# Patient Record
Sex: Male | Born: 1950
Health system: Southern US, Community
[De-identification: ages and names within clinical notes are randomized; demographics above are authoritative.]

## PROBLEM LIST (undated history)

## (undated) DIAGNOSIS — F32A Depression, unspecified: Secondary | ICD-10-CM

## (undated) DIAGNOSIS — I10 Essential (primary) hypertension: Secondary | ICD-10-CM

## (undated) DIAGNOSIS — R002 Palpitations: Secondary | ICD-10-CM

## (undated) DIAGNOSIS — E039 Hypothyroidism, unspecified: Secondary | ICD-10-CM

## (undated) DIAGNOSIS — E669 Obesity, unspecified: Secondary | ICD-10-CM

## (undated) DIAGNOSIS — I471 Supraventricular tachycardia, unspecified: Secondary | ICD-10-CM

## (undated) DIAGNOSIS — I4891 Unspecified atrial fibrillation: Secondary | ICD-10-CM

## (undated) DIAGNOSIS — F329 Major depressive disorder, single episode, unspecified: Secondary | ICD-10-CM

## (undated) DIAGNOSIS — F419 Anxiety disorder, unspecified: Secondary | ICD-10-CM

## (undated) HISTORY — PX: CHOLECYSTECTOMY: SHX55

## (undated) HISTORY — DX: Depression, unspecified: F32.A

## (undated) HISTORY — DX: Unspecified atrial fibrillation: I48.91

## (undated) HISTORY — DX: Obesity, unspecified: E66.9

## (undated) HISTORY — DX: Palpitations: R00.2

## (undated) HISTORY — DX: Supraventricular tachycardia: I47.1

## (undated) HISTORY — DX: Hypothyroidism, unspecified: E03.9

## (undated) HISTORY — PX: TONSILLECTOMY: SUR1361

## (undated) HISTORY — DX: Supraventricular tachycardia, unspecified: I47.10

## (undated) HISTORY — DX: Anxiety disorder, unspecified: F41.9

## (undated) HISTORY — DX: Major depressive disorder, single episode, unspecified: F32.9

---

## 2007-09-05 HISTORY — PX: CARDIOVASCULAR STRESS TEST: SHX262

## 2007-09-11 ENCOUNTER — Ambulatory Visit: Payer: Self-pay | Admitting: Cardiovascular Disease

## 2007-12-27 ENCOUNTER — Encounter: Payer: Self-pay | Admitting: Cardiovascular Disease

## 2008-01-16 ENCOUNTER — Encounter: Payer: Self-pay | Admitting: Cardiovascular Disease

## 2008-12-14 ENCOUNTER — Encounter: Payer: Self-pay | Admitting: Cardiovascular Disease

## 2009-03-12 DIAGNOSIS — F341 Dysthymic disorder: Secondary | ICD-10-CM

## 2009-03-12 DIAGNOSIS — I4891 Unspecified atrial fibrillation: Secondary | ICD-10-CM

## 2009-03-12 DIAGNOSIS — R002 Palpitations: Secondary | ICD-10-CM | POA: Insufficient documentation

## 2009-03-12 DIAGNOSIS — E039 Hypothyroidism, unspecified: Secondary | ICD-10-CM | POA: Insufficient documentation

## 2009-03-15 ENCOUNTER — Ambulatory Visit: Payer: Self-pay | Admitting: Cardiovascular Disease

## 2009-03-15 DIAGNOSIS — I498 Other specified cardiac arrhythmias: Secondary | ICD-10-CM

## 2009-03-24 ENCOUNTER — Ambulatory Visit: Payer: Self-pay | Admitting: Internal Medicine

## 2009-03-24 DIAGNOSIS — E669 Obesity, unspecified: Secondary | ICD-10-CM

## 2010-03-01 NOTE — Assessment & Plan Note (Signed)
Summary: eval for svt   Primary Provider:  Dr Sherryll Burger   History of Present Illness: Justin [name] is referred today by Dr. Excell Seltzer for evaluation of SVT.  The patient is a pleasant morbidly obese man with a h/o palpitations for several yrs.  These episodes start and stop suddenly and last only a minute or two at a time.  He does experience some dizziness and lightheadedness but denies anginal symptoms.  They are associated with anxiety.   Current Medications (verified): 1)  Avodart 0.5 Mg Caps (Dutasteride) .Marland Kitchen.. 1 By Mouth As Needed 2)  Levoxyl 150 Mcg Tabs (Levothyroxine Sodium) .Marland Kitchen.. 1 By Mouth Daily 3)  Paxil 40 Mg Tabs (Paroxetine Hcl) .... Take 1 Tablet By Mouth Once A Day 4)  Xanax 1 Mg Tabs (Alprazolam) .... Take 1 Tablet By Mouth Three Times A Day 5)  Aspirin 81 Mg Tbec (Aspirin) .... Take One Tablet By Mouth Daily 6)  Metoprolol Succinate 50 Mg Xr24h-Tab (Metoprolol Succinate) .... Take One Tablet By Mouth Daily 7)  Krill Oil 1000 Mg Caps (Krill Oil) .Marland Kitchen.. 1 By Mouth Daily 8)  Fish Oil   Oil (Fish Oil) .Marland Kitchen.. 1 By Mouth Daily  Allergies (verified): 1)  ! Haldol  Past History:  Past Medical History: Last updated: 03/15/2009 Current Problems:  PALPITATIONS (ICD-785.1) DEPRESSION/ANXIETY (ICD-300.4) HYPOTHYROIDISM (ICD-244.9)  Past Surgical History: Last updated: 03/12/2009 Cholecystectomy.  Review of Systems       All systems reviewed and negative except as noted in the HPI.  Vital Signs:  Patient profile:   60 year old male Height:      70 inches Weight:      298 pounds BMI:     42.91 Pulse rate:   65 / minute Resp:     16 per minute BP sitting:   108 / 73  (right arm)  Vitals Entered By: Marrion Coy, CNA (March 24, 2009 2:29 PM)  Physical Exam  General:  Pt is alert and oriented, obese male, in no acute distress. HEENT: normal Neck: normal carotid upstrokes without bruits, JVP normal Lungs: CTA. No wheezes, rales, or rhonchi. CV: RRR without murmur or  gallop Abd: soft, NT, positive BS, no bruit, no organomegaly Ext: no clubbing, cyanosis, or edema. peripheral pulses 2+ and equal Skin: warm and dry without rash    EKG  Procedure date:  03/15/2009  Findings:      Normal sinus rhythm with rate of: 64.   Impression & Recommendations:  Problem # 1:  SUPRAVENTRICULAR TACHYCARDIA (ICD-427.89) We only have Non-sustained episodes on cardiac monitoring and the rates are slow.  I am uncertain of the mechanism.  His symptoms are self limited and not particularly bothersome at this point.  I would recommend a period of watchful waiting rather than proceeding with ablation.  If his symptoms worsen then ablation would be warranted. His updated medication list for this problem includes:    Aspirin 81 Mg Tbec (Aspirin) .Marland Kitchen... Take one tablet by mouth daily    Metoprolol Succinate 50 Mg Xr24h-tab (Metoprolol succinate) .Marland Kitchen... Take one tablet by mouth daily  Problem # 2:  OBESITY, UNSPECIFIED (ICD-278.00) No obvious evidence of hypoventilation.  A low fat diet and weight loss are recommended.  Patient Instructions: 1)  Your physician recommends that you schedule a follow-up appointment in: as needed

## 2010-03-01 NOTE — Assessment & Plan Note (Signed)
Summary: f2y   Visit Type:  2 years follow up Primary Provider:  Dr Sherryll Burger  CC:  atrial fibrillation- Sob.  History of Present Illness: 60 year-old Justin [name] presents for further evaluation of palpitations.  He was evaluated in 2009 for the same problem, underwent event recording, and was diagnosed with pSVT ?AVNRT. He was started on Toprol XL, improved initially, but now has worsening palps again. He has chronic DOE, denies chest pain, edema, or other problems. Denies lightheadedness or syncope.  Current Medications (verified): 1)  Avodart 0.5 Mg Caps (Dutasteride) .... Take 1 Capsule By Mouth Once A Day 2)  Synthroid 125 Mcg Tabs (Levothyroxine Sodium) .... Take 1 Tablet By Mouth Once A Day 3)  Paxil 40 Mg Tabs (Paroxetine Hcl) .... Take 1 Tablet By Mouth Once A Day 4)  Xanax 1 Mg Tabs (Alprazolam) .... Take 1 Tablet By Mouth Three Times A Day 5)  Aspirin 81 Mg Tbec (Aspirin) .... Take One Tablet By Mouth Daily 6)  Toprol Xl 25 Mg Xr24h-Tab (Metoprolol Succinate) .... Take 1 Tablet By Mouth Once A Day  Allergies: 1)  ! Haldol  Past History:  Past medical history reviewed for relevance to current acute and chronic problems.  Past Medical History: Current Problems:  PALPITATIONS (ICD-785.1) DEPRESSION/ANXIETY (ICD-300.4) HYPOTHYROIDISM (ICD-244.9)  Review of Systems       Negative except as per HPI   Vital Signs:  Patient profile:   60 year old Justin [name] Height:      70 inches Weight:      298.25 pounds BMI:     42.95 Pulse rate:   62 / minute Pulse rhythm:   regular Resp:     20 per minute BP sitting:   100 / 68  (left arm) Cuff size:   large  Vitals Entered By: Vikki Ports (March 15, 2009 3:34 PM)  Physical Exam  General:  Pt is alert and oriented, obese Justin [name], in no acute distress. HEENT: normal Neck: normal carotid upstrokes without bruits, JVP normal Lungs: CTA CV: RRR without murmur or gallop Abd: soft, NT, positive BS, no bruit, no organomegaly Ext: no  clubbing, cyanosis, or edema. peripheral pulses 2+ and equal Skin: warm and dry without rash    EKG  Procedure date:  03/15/2009  Findings:      NSR, within normal limits, HR 62 bpm  Impression & Recommendations:  Problem # 1:  SUPRAVENTRICULAR TACHYCARDIA (ICD-427.89) Increase Toprol XL to 50 mg daily. Refer to EP for further eval. Prior event recording in chart for review.  We discussed possibility of catheter ablation versus ongoing medical therapy and he would like to pursue discussion with EP.  Past eval has included echo with normal LV function and stress Myoview with no ischemia.  His updated medication list for this problem includes:    Aspirin 81 Mg Tbec (Aspirin) .Marland Kitchen... Take one tablet by mouth daily    Metoprolol Succinate 50 Mg Xr24h-tab (Metoprolol succinate) .Marland Kitchen... Take one tablet by mouth daily  Orders: EKG w/ Interpretation (93000) EP Referral (Cardiology EP Ref )  Patient Instructions: 1)  Your physician has recommended you make the following change in your medication: INCREASE Metoprolol Succinate (Toprol XL)  to 50mg  once a day 2)  You have been referred to an Electrophysiologist for SVT.  Prescriptions: METOPROLOL SUCCINATE 50 MG XR24H-TAB (METOPROLOL SUCCINATE) Take one tablet by mouth daily  #30 x 6   Entered by:   Julieta Gutting, RN, BSN   Authorized by:   Vale Haven  Excell Seltzer, MD   Signed by:   Julieta Gutting, RN, BSN on 03/15/2009   Method used:   Electronically to        [facility] [address] (retail)       [address]       [place], [place]  [address]       Ph: [phone] or [phone]       Fax: [phone]   RxID:   [id number]

## 2010-03-01 NOTE — Consult Note (Signed)
Summary: [facility]  [facility]   Imported By: Marylou Mccoy 04/15/2009 14:23:49  _____________________________________________________________________  External Attachment:    Type:   Image     Comment:   External Document

## 2010-06-08 ENCOUNTER — Other Ambulatory Visit: Payer: Self-pay | Admitting: *Deleted

## 2010-06-08 ENCOUNTER — Other Ambulatory Visit: Payer: Self-pay | Admitting: Cardiovascular Disease

## 2010-06-08 MED ORDER — METOPROLOL SUCCINATE ER 50 MG PO TB24: 50.0000 mg | ORAL_TABLET | Freq: Every day | ORAL | Status: DC

## 2010-06-14 NOTE — Letter (Signed)
September 11, 2007    Justin Peri, MD  [address]  [place], [place] [address]   RE:  Justin, [name]  MRN:  [id number]  /  DOB:  06-23-1950   Dear Dr. Sherryll [name],   It was my pleasure to see Justin [name] for evaluation of palpitations  on September 11, 2007 as an outpatient.  As you know, he is a very nice 60-  year-old gentleman who reports heart palpitations over several months.  They occur approximately 3 times daily and have no associated symptoms.  They come on unpredictably and occur, both at rest and at times with  activity.  He describes a uncomfortable feeling in his chest.  He  denies associated dyspnea, lightheadedness, or syncope.  The symptoms  generally last for a number of seconds.  The longest duration has been a  few minutes.  The symptoms have not been aborted with Valsalva or  coughing maneuvers, although, but these have not been tried in the past.   Justin [name] has undergone extensive evaluation under your direction.  He  has had a 2-D echo, which showed normal LV systolic function with mild  concentric left ventricular hypertrophy and no significant valve  abnormalities.  LVEF was estimated at 60%.  He also underwent a exercise  Myoview scan, where he was able to exercise for 5 minutes according to  the Bruce protocol.  He achieved 90% of his maximum predicted heart rate  and had no significant arrhythmias or chest pain.  He had a normal blood  pressure response and normal perfusion imaging.  Thyroid studies have  been normal.  Event recording was performed and demonstrated sinus  rhythm throughout with the exception of a 26-beat run of tachycardia.   PAST MEDICAL HISTORY:  1. Hypothyroidism.  2. Depression.  3. Cholecystectomy.   SOCIAL HISTORY:  The patient is married.  He has 1 child and lives in  [place].  He is a former smoker, but quit 13 years ago.  He does not drink  alcohol or use illicit drugs.  He drinks a very small amount of  caffeine.  He has not  participated in any regular exercise.  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   FAMILY HISTORY:  Parents both died of heart disease in their 75s.   MEDICATIONS:  1. Avodart 0.5 mg daily.  2. Synthroid 125 mcg daily.  3. Paxil 60 mg daily.  4. Xanax 1 mg 3 times daily.  5. Aspirin 81 mg daily.  6. He has been given a prescription for Toprol-XL 25 mg, but has not      started this yet.   ALLERGIES:  ACEON.   REVIEW OF SYSTEMS:  Pertinent positives included rare headaches and  dizziness.  Reflux symptoms were noted as were anxiety and depression.  All other systems were reviewed and negative except as described above.   PHYSICAL EXAMINATION:  GENERAL:  The patient is alert and oriented.  He  is in no acute distress.  He is a very pleasant obese white male.  VITAL SIGNS:  Weight is 287 pounds, respiratory rate 12, heart rate 60,  and blood pressure 109/72.  HEENT:  Normal.  NECK:  Normal carotid upstrokes without bruits.  JVP normal.  No  thyromegaly or thyroid nodules.  LUNGS:  Clear to auscultation bilaterally.  HEART:  The apex is not palpable.  Hear has a regular rate and rhythm.  There are no murmurs or gallops.  There  is no right ventricular heave or  lift.  ABDOMEN:  Soft, obese, and nontender.  Positive bowel sounds.  No  obvious organomegaly.  BACK:  No CVA tenderness.  EXTREMITIES:  No clubbing, cyanosis, or edema.  Peripheral pulses are 2+  and equal throughout.  SKIN:  Warm and dry without rash.  LYMPHATICS:  No adenopathy.  NEUROLOGIC:  Cranial nerves II-XII are intact.  Strength is 5/5 and  equal in the arms and legs.   EKG, shows sinus rhythm and is within normal limits.  The heart rate is  72 beats per minute.   Other studies reviewed as detailed in the HPI.   ASSESSMENT:  This is a 60 year old gentleman with palpitations.  He has  no evidence of structural or ischemic heart disease based on extensive  evaluation as outlined.  The patient's  event recording shows a regular  narrow complex tachycardia.  There is an axis shift and some aberrancy  compared to his sinus rhythm.  There is a suggestion of retrograde P-  waves, which raise the possibility of AV nodal reentrant tachycardia.  I  discussed the possible treatment options, which include medical therapy  with a beta-blocker or calcium blocker as first line.  I think a trial  of Toprol-XL was appropriate.  The patient will go ahead and start this  medication that you have prescribed.  He really prefers not to be on  long-term medication if possible.  So, I think an evaluation by one of  our EP Colleagues for ablation is warranted.  This will be arranged and  we will see if he may have an ablatable rhythm, which the patient  strongly desires.   Dr. Sherryll [name], thanks again for allowing me to participate in the care of  this very nice gentleman.  Please feel free to call at any time with  questions.    Sincerely,      Justin Fells. Excell Seltzer, MD  Electronically Signed    MDC/MedQ  DD: 09/11/2007  DT: 09/12/2007  Job #: [id number]

## 2011-01-21 ENCOUNTER — Other Ambulatory Visit: Payer: Self-pay | Admitting: Cardiovascular Disease

## 2011-01-23 ENCOUNTER — Other Ambulatory Visit: Payer: Self-pay

## 2011-01-23 MED ORDER — METOPROLOL SUCCINATE ER 50 MG PO TB24: 50.0000 mg | ORAL_TABLET | Freq: Every day | ORAL | Status: DC

## 2011-01-23 NOTE — Telephone Encounter (Signed)
.   Requested Prescriptions   Signed Prescriptions Disp Refills  . metoprolol (TOPROL XL) 50 MG 24 hr tablet 30 tablet 6    Sig: Take 1 tablet (50 mg total) by mouth daily.    Authorizing Provider: Tonny Bollman D    Ordering User: Lacie Scotts   E-scribe to [facility].

## 2011-01-25 ENCOUNTER — Telehealth: Payer: Self-pay | Admitting: Internal Medicine

## 2011-01-25 NOTE — Telephone Encounter (Signed)
New PRoblem:    PAtient called in wanting to have his metoprolol (TOPROL XL) 50 MG 24 hr tablet refilled. Also would like an appointment to see Dr. Ladona Ridgel as soon as possible because he says that his Tachycardia is getting worse, he is short winded and his legs are hurting.  Please feel free to leave a message on his answering machiene.

## 2011-01-25 NOTE — Telephone Encounter (Signed)
lmom for patient that his medication was called into [facility] and Glynda Jaeger would call him tomorrow with an appointment  To follow up with Dr Ladona Ridgel to discuss an ablation

## 2011-02-28 ENCOUNTER — Encounter: Payer: Self-pay | Admitting: Internal Medicine

## 2011-03-06 ENCOUNTER — Encounter: Payer: Self-pay | Admitting: Internal Medicine

## 2011-03-06 ENCOUNTER — Ambulatory Visit (INDEPENDENT_AMBULATORY_CARE_PROVIDER_SITE_OTHER): Payer: BC Managed Care – PPO | Admitting: Internal Medicine

## 2011-03-06 VITALS — BP 112/62 | HR 53 | Ht 70.0 in | Wt 298.0 lb

## 2011-03-06 DIAGNOSIS — I498 Other specified cardiac arrhythmias: Secondary | ICD-10-CM

## 2011-03-06 DIAGNOSIS — R002 Palpitations: Secondary | ICD-10-CM

## 2011-03-06 DIAGNOSIS — I4891 Unspecified atrial fibrillation: Secondary | ICD-10-CM

## 2011-03-06 NOTE — Patient Instructions (Signed)
Your physician has requested that you have an exercise tolerance test. For further information please visit [email or web address]. Please also follow instruction sheet, as given.   

## 2011-03-06 NOTE — Assessment & Plan Note (Signed)
His symptoms are currently not well controlled. Will undergo exercise treadmill testing as he notes that his palpitations have been associated with exertion.

## 2011-03-06 NOTE — Assessment & Plan Note (Signed)
The etiology of his symptoms is unclear. What is clear is that they are reproducible with exertion. The spells start and stop suddenly. They related to exertion but also occur in the middle of night while he is sleeping. I've recommended that he undergo exercise treadmill testing as exertion seems to be fairly reproducible at breathing on his spells.

## 2011-03-06 NOTE — Assessment & Plan Note (Signed)
The palpitations could be related to atrial fibrillation. We have not documented this however. He will undergo a period of watchful waiting.

## 2011-03-06 NOTE — Progress Notes (Signed)
HPI Mr. Wenke returns today for followup of palpitations and SVT. The patient is a 61 year old man with a long history of tachy- palpitations. I saw him approximately 2 years ago and placed him on beta blockers. He did well until approximately 6 months ago when he began to experience recurrent tachypalpitations. The patient notes that with any exertion, he is heart will race for up to an hour at a time. At other times particularly when he is sleeping he will feel like it is racing. The episodes start and stop suddenly. He has had no syncope but gets lightheaded. With his episodes of heart racing, he experiences dyspnea. Allergies  Allergen Reactions  . Haloperidol Lactate      Current Outpatient Prescriptions  Medication Sig Dispense Refill  . ALPRAZolam (XANAX) 1 MG tablet Take 1 mg by mouth 4 (four) times daily.      . fish oil-omega-3 fatty acids 1000 MG capsule Take 2 g by mouth daily.      Marland Kitchen levothyroxine (SYNTHROID, LEVOTHROID) 150 MCG tablet Take 150 mcg by mouth daily.      . metoprolol (TOPROL XL) 50 MG 24 hr tablet Take 1 tablet (50 mg total) by mouth daily.  30 tablet  6  . PARoxetine (PAXIL) 20 MG tablet Take 20 mg by mouth every morning.         Past Medical History  Diagnosis Date  . AF (atrial fibrillation)   . Palpitations   . Anxiety and depression   . Hypothyroidism     ROS:   All systems reviewed and negative except as noted in the HPI.   Past Surgical History  Procedure Date  . Cholecystectomy   . Cardiovascular stress test 09/05/2007     Family History  Problem Relation Age of Onset  . Heart disease Mother   . Heart disease Father      History   Social History  . Marital Status: Married    Spouse Name: N/A    Number of Children: N/A  . Years of Education: N/A   Occupational History  . Not on file.   Social History Main Topics  . Smoking status: Former Games developer  . Smokeless tobacco: Not on file  . Alcohol Use: No  . Drug Use: No  .  Sexually Active: Not on file   Other Topics Concern  . Not on file   Social History Narrative  . No narrative on file     BP 112/62  Pulse 53  Ht 5\' 10"  (1.778 m)  Wt 135.172 kg (298 lb)  BMI 42.76 kg/m2  Physical Exam:  Well appearing middle-aged man, NAD HEENT: Unremarkable Neck:  No JVD, no thyromegally Lungs:  Clear with no wheezes, rales, or rhonchi. HEART:  Regular rate rhythm, no murmurs, no rubs, no clicks Abd:  soft, positive bowel sounds, no organomegally, no rebound, no guarding Ext:  2 plus pulses, no edema, no cyanosis, no clubbing Skin:  No rashes no nodules Neuro:  CN II through XII intact, motor grossly intact  EKG Normal sinus rhythm.  Assess/Plan:

## 2011-03-22 ENCOUNTER — Ambulatory Visit (INDEPENDENT_AMBULATORY_CARE_PROVIDER_SITE_OTHER): Payer: BC Managed Care – PPO | Admitting: Physician Assistant

## 2011-03-22 ENCOUNTER — Encounter: Payer: BC Managed Care – PPO | Admitting: Physician Assistant

## 2011-03-22 DIAGNOSIS — R002 Palpitations: Secondary | ICD-10-CM

## 2011-03-22 NOTE — Progress Notes (Signed)
Exercise Treadmill Test  Pre-Exercise Testing Evaluation Rhythm: normal sinus  Rate: 71   PR:  20 QRS:  .08  QT:  42 QTc: 45     Test  Exercise Tolerance Test Ordering MD: Lewayne Bunting, MD  Interpreting MD:  Tereso Newcomer PA-C  Unique Test No: 1  Treadmill:  1  Indication for ETT: Palps  Contraindication to ETT: No   Stress Modality: exercise - treadmill  Cardiac Imaging Performed: non   Protocol: standard Bruce - maximal  Max BP:  160/87  Max MPHR (bpm):  159 85% MPR (bpm):  135  MPHR obtained (bpm):  126 % MPHR obtained:  79%  Reached 85% MPHR (min:sec):  n/a Total Exercise Time (min-sec):  4:25  Workload in METS:  5.8 Borg Scale: 17  Reason ETT Terminated:  patient's desire to stop    ST Segment Analysis At Rest: normal ST segments - no evidence of significant ST depression With Exercise: non-specific ST changes  Other Information Arrhythmia:  PACs and PVCs in recovery Angina during ETT:  absent (0) Quality of ETT:  non-diagnostic  ETT Interpretation:  normal - no evidence of ischemia by ST analysis at submaximal exercise  Comments: Poor exercise tolerance. Exercise limited by back and knee pain. No chest pain. Normal BP response to exercise. No ST-T changes to suggest ischemia at submaximal exercise.  Frequent PACs and PVCs (unifocal) in recovery (especially early in recovery).  Patient was mildly symptomatic with this.  Recommendations: Follow up with Dr. Lewayne Bunting as directed. Tereso Newcomer, PA-C  10:29 AM 03/22/2011

## 2011-12-06 ENCOUNTER — Other Ambulatory Visit: Payer: Self-pay | Admitting: Cardiovascular Disease

## 2012-05-10 ENCOUNTER — Other Ambulatory Visit: Payer: Self-pay | Admitting: Cardiovascular Disease

## 2013-02-05 ENCOUNTER — Telehealth: Payer: Self-pay | Admitting: Internal Medicine

## 2013-02-05 NOTE — Telephone Encounter (Signed)
Melissa called patient and offered an appointment for tomorrow.  Left message for patient to call back

## 2013-02-05 NOTE — Telephone Encounter (Signed)
New message  Pt called states that his metoporlol is not working. He is also requesting an exercise tolerance test// No orders// Please assist.

## 2013-02-07 ENCOUNTER — Encounter: Payer: Self-pay | Admitting: Internal Medicine

## 2013-02-07 ENCOUNTER — Ambulatory Visit (INDEPENDENT_AMBULATORY_CARE_PROVIDER_SITE_OTHER): Payer: BC Managed Care – PPO | Admitting: Internal Medicine

## 2013-02-07 ENCOUNTER — Encounter: Payer: Self-pay | Admitting: *Deleted

## 2013-02-07 VITALS — BP 116/80 | HR 58 | Ht 70.0 in | Wt 277.8 lb

## 2013-02-07 DIAGNOSIS — R0789 Other chest pain: Secondary | ICD-10-CM

## 2013-02-07 DIAGNOSIS — I4891 Unspecified atrial fibrillation: Secondary | ICD-10-CM

## 2013-02-07 DIAGNOSIS — R079 Chest pain, unspecified: Secondary | ICD-10-CM

## 2013-02-07 MED ORDER — NITROGLYCERIN 0.4 MG SL SUBL: 0.4000 mg | SUBLINGUAL_TABLET | SUBLINGUAL | Status: DC | PRN

## 2013-02-07 NOTE — Patient Instructions (Signed)
Your physician recommends that you schedule a follow-up appointment in: ONE MONTH WITH DR Ladona RidgelAYLOR  Your physician has requested that you have a dobutamine echocardiogram. For further information please visit [email or web address]. Please follow instruction sheet as given.

## 2013-02-07 NOTE — Assessment & Plan Note (Signed)
He has had no recurrent documented atrial arrhythmias. We'll continue his current medications.

## 2013-02-07 NOTE — Assessment & Plan Note (Signed)
The etiology of his symptoms is unclear. He has multiple cardiac risk factors. He will undergo dobutamine echocardiography. Based on the results, we will consider additional testing as indicated.

## 2013-02-07 NOTE — Progress Notes (Signed)
HPI [name] returns today for followup of palpitations and SVT. The patient is a [age] year old man with a long history of tachy- palpitations. I saw him approximately 3 years ago and placed him on beta blockers. He still has occasional palpitations.unfortunately, the patient also has had substernal chest discomfort, associated with shortness of breath and radiation into the neck. At times these episodes are associated with palpitations, but at other times not. He has not taken any nitroglycerin. Allergies  Allergen Reactions  . Haloperidol Lactate      Current Outpatient Prescriptions  Medication Sig Dispense Refill  . ALPRAZolam (XANAX) 1 MG tablet Take 1 mg by mouth 3 (three) times daily.       Marland Kitchen. aspirin EC 81 MG tablet Take 81 mg by mouth daily.      Marland Kitchen. levothyroxine (SYNTHROID, LEVOTHROID) 150 MCG tablet Take 150 mcg by mouth daily.      . metoprolol succinate (TOPROL-XL) 50 MG 24 hr tablet TAKE ONE TABLET BY MOUTH ONCE DAILY.  30 tablet  0  . Omega-3 Krill Oil 1000 MG CAPS Take 1,000 mg by mouth daily.      Marland Kitchen. PARoxetine (PAXIL) 20 MG tablet Take 20 mg by mouth every morning.      . tamsulosin (FLOMAX) 0.4 MG CAPS capsule Take 1 capsule by mouth as needed.        No current facility-administered medications for this visit.     Past Medical History  Diagnosis Date  . AF (atrial fibrillation)   . Palpitations   . Anxiety and depression   . Hypothyroidism   . SVT (supraventricular tachycardia)   . Obesity     ROS:   All systems reviewed and negative except as noted in the HPI.   Past Surgical History  Procedure Laterality Date  . Cholecystectomy    . Cardiovascular stress test  09/05/2007     Family History  Problem Relation Age of Onset  . Heart disease Mother   . Heart disease Father      History   Social History  . Marital Status: Married    Spouse Name: N/A    Number of Children: N/A  . Years of Education: N/A   Occupational History  . Not on file.    Social History Main Topics  . Smoking status: Former Games developermoker  . Smokeless tobacco: Not on file  . Alcohol Use: No  . Drug Use: No  . Sexual Activity: Not on file   Other Topics Concern  . Not on file   Social History Narrative  . No narrative on file     BP 116/80  Pulse 58  Ht 5\' 10"  (1.778 m)  Wt 277 lb 12.8 oz (126.009 kg)  BMI 39.86 kg/m2  Physical Exam:  Well appearing obese, middle-aged man, NAD HEENT: Unremarkable Neck:  No JVD, no thyromegally Lungs:  Clear with no wheezes, rales, or rhonchi. HEART:  Regular rate rhythm, no murmurs, no rubs, no clicks Abd:  Soft, obese, positive bowel sounds, no organomegally, no rebound, no guarding Ext:  2 plus pulses, no edema, no cyanosis, no clubbing Skin:  No rashes no nodules Neuro:  CN II through XII intact, motor grossly intact  EKG Normal sinus rhythm.  Assess/Plan:

## 2013-02-11 NOTE — Telephone Encounter (Signed)
Patient was seen 02/07/13

## 2013-02-28 ENCOUNTER — Ambulatory Visit (HOSPITAL_COMMUNITY): Payer: BC Managed Care – PPO | Attending: Internal Medicine | Admitting: Radiology

## 2013-02-28 ENCOUNTER — Encounter: Payer: Self-pay | Admitting: Internal Medicine

## 2013-02-28 ENCOUNTER — Ambulatory Visit (HOSPITAL_BASED_OUTPATIENT_CLINIC_OR_DEPARTMENT_OTHER): Payer: BC Managed Care – PPO

## 2013-02-28 DIAGNOSIS — R42 Dizziness and giddiness: Secondary | ICD-10-CM | POA: Insufficient documentation

## 2013-02-28 DIAGNOSIS — R072 Precordial pain: Secondary | ICD-10-CM

## 2013-02-28 DIAGNOSIS — Z87891 Personal history of nicotine dependence: Secondary | ICD-10-CM | POA: Insufficient documentation

## 2013-02-28 DIAGNOSIS — R0609 Other forms of dyspnea: Secondary | ICD-10-CM | POA: Insufficient documentation

## 2013-02-28 DIAGNOSIS — Z8249 Family history of ischemic heart disease and other diseases of the circulatory system: Secondary | ICD-10-CM | POA: Insufficient documentation

## 2013-02-28 DIAGNOSIS — R0989 Other specified symptoms and signs involving the circulatory and respiratory systems: Secondary | ICD-10-CM

## 2013-02-28 DIAGNOSIS — E669 Obesity, unspecified: Secondary | ICD-10-CM | POA: Insufficient documentation

## 2013-02-28 DIAGNOSIS — I498 Other specified cardiac arrhythmias: Secondary | ICD-10-CM | POA: Insufficient documentation

## 2013-02-28 DIAGNOSIS — R079 Chest pain, unspecified: Secondary | ICD-10-CM | POA: Insufficient documentation

## 2013-02-28 MED ORDER — PERFLUTREN PROTEIN A MICROSPH IV SUSP
3.0000 mL | Freq: Once | INTRAVENOUS | Status: AC
  Administered 2013-02-28: 3 mL via INTRAVENOUS

## 2013-02-28 NOTE — Progress Notes (Signed)
Dobutamine stress Echocardiogram performed with optison

## 2013-03-21 ENCOUNTER — Encounter: Payer: Self-pay | Admitting: Internal Medicine

## 2013-03-21 ENCOUNTER — Ambulatory Visit (INDEPENDENT_AMBULATORY_CARE_PROVIDER_SITE_OTHER): Payer: BC Managed Care – PPO | Admitting: Internal Medicine

## 2013-03-21 VITALS — BP 117/73 | HR 63 | Ht 70.0 in | Wt 275.0 lb

## 2013-03-21 DIAGNOSIS — R0789 Other chest pain: Secondary | ICD-10-CM

## 2013-03-21 DIAGNOSIS — R002 Palpitations: Secondary | ICD-10-CM

## 2013-03-21 MED ORDER — METOPROLOL SUCCINATE ER 50 MG PO TB24: ORAL_TABLET | ORAL | Status: DC

## 2013-03-21 NOTE — Patient Instructions (Signed)
Your physician recommends that you schedule a follow-up appointment in: 1 year with Dr Taylor You will receive a reminder letter two months in advance reminding you to call and schedule your appointment. If you don't receive this letter, please contact our office.  Your physician recommends that you continue on your current medications as directed. Please refer to the Current Medication list given to you today.   

## 2013-03-21 NOTE — Assessment & Plan Note (Signed)
His dobutamine stress test was negative for ischemia. Will follow.

## 2013-03-21 NOTE — Progress Notes (Signed)
      HPI Mr. Annice NeedyGauldin returns today for followup. He has a h/o c/p and palpitations and I saw him approximately a month ago and he has undergone dobutamine echo which demonstrated no ischemia. He also has a h/o palpitations of unclear etiology. His blood pressure has been controlled. No syncope.  Allergies  Allergen Reactions  . Haloperidol Lactate      Current Outpatient Prescriptions  Medication Sig Dispense Refill  . ALPRAZolam (XANAX) 1 MG tablet Take 1 mg by mouth 3 (three) times daily.       Marland Kitchen. aspirin EC 81 MG tablet Take 81 mg by mouth daily.      Marland Kitchen. levothyroxine (SYNTHROID, LEVOTHROID) 150 MCG tablet Take 150 mcg by mouth daily.      . metoprolol succinate (TOPROL-XL) 50 MG 24 hr tablet TAKE ONE TABLET BY MOUTH ONCE DAILY.  30 tablet  0  . nitroGLYCERIN (NITROSTAT) 0.4 MG SL tablet Place 1 tablet (0.4 mg total) under the tongue every 5 (five) minutes as needed for chest pain.  25 tablet  12  . Omega-3 Krill Oil 1000 MG CAPS Take 1,000 mg by mouth daily.      Marland Kitchen. PARoxetine (PAXIL) 20 MG tablet Take 20 mg by mouth every morning.      . tamsulosin (FLOMAX) 0.4 MG CAPS capsule Take 1 capsule by mouth as needed.        No current facility-administered medications for this visit.     Past Medical History  Diagnosis Date  . AF (atrial fibrillation)   . Palpitations   . Anxiety and depression   . Hypothyroidism   . SVT (supraventricular tachycardia)   . Obesity     ROS:   All systems reviewed and negative except as noted in the HPI.   Past Surgical History  Procedure Laterality Date  . Cholecystectomy    . Cardiovascular stress test  09/05/2007     Family History  Problem Relation Age of Onset  . Heart disease Mother   . Heart disease Father      History   Social History  . Marital Status: Married    Spouse Name: N/A    Number of Children: N/A  . Years of Education: N/A   Occupational History  . Not on file.   Social History Main Topics  . Smoking  status: Former Games developermoker  . Smokeless tobacco: Not on file  . Alcohol Use: No  . Drug Use: No  . Sexual Activity: Not on file   Other Topics Concern  . Not on file   Social History Narrative  . No narrative on file     BP 117/73  Pulse 63  Ht 5\' 10"  (1.778 m)  Wt 275 lb (124.739 kg)  BMI 39.46 kg/m2  Physical Exam:  Well appearing middle aged man, NAD HEENT: Unremarkable Neck:  No JVD, no thyromegally Back:  No CVA tenderness Lungs:  Clear with no wheezes HEART:  Regular rate rhythm, no murmurs, no rubs, no clicks Abd:  soft, positive bowel sounds, no organomegally, no rebound, no guarding Ext:  2 plus pulses, no edema, no cyanosis, no clubbing Skin:  No rashes no nodules Neuro:  CN II through XII intact, motor grossly intact   Dobutamin echo - reviewed. No ischemia. Normal LV function  Assess/Plan:

## 2013-03-21 NOTE — Assessment & Plan Note (Signed)
The etiology is unclear. His symptoms are fairly well controlled. I have asked the patient to take an additional dose of metoprolol, particularly if bothered by palpitations in the evening. If this still does not control his symptoms, then he is instructed to call our office and we will prescribe flecainide and early followup.

## 2015-08-19 ENCOUNTER — Ambulatory Visit (INDEPENDENT_AMBULATORY_CARE_PROVIDER_SITE_OTHER): Payer: Medicare Other | Admitting: Internal Medicine

## 2015-08-19 ENCOUNTER — Encounter: Payer: Self-pay | Admitting: Internal Medicine

## 2015-08-19 VITALS — BP 106/74 | HR 56 | Ht 70.0 in | Wt 266.0 lb

## 2015-08-19 DIAGNOSIS — R002 Palpitations: Secondary | ICD-10-CM

## 2015-08-19 DIAGNOSIS — R072 Precordial pain: Secondary | ICD-10-CM | POA: Diagnosis not present

## 2015-08-19 MED ORDER — NITROGLYCERIN 0.4 MG SL SUBL: 0.4000 mg | SUBLINGUAL_TABLET | SUBLINGUAL | Status: AC | PRN

## 2015-08-19 NOTE — Patient Instructions (Addendum)
Your physician recommends that you schedule a follow-up appointment in:  Based on test results     Your physician has recommended that you wear a holter monitor. Holter monitors are medical devices that record the heart's electrical activity. Doctors most often use these monitors to diagnose arrhythmias. Arrhythmias are problems with the speed or rhythm of the heartbeat. The monitor is a small, portable device. You can wear one while you do your normal daily activities. This is usually used to diagnose what is causing palpitations/syncope (passing out).  .Your physician has requested that you have a lexiscan myoview. For further information please visit [email or web address]. Please follow instruction sheet, as given.     I refilled your NTG

## 2015-08-19 NOTE — Progress Notes (Signed)
HPI [name] returns today for followup. He is a 65 yo man with PAF, and SVT, and HTN. I have not seen him in over 2 years. His blood pressure has been controlled. No syncope. He c/o worsening palpitations which occur daily. He also c/o chest pressure which is different than his heart burn. Finally, his wife who sleeps in a differnet room in the house notes that he snores so loud the roof shakes! No edema. Allergies  Allergen Reactions  . Haloperidol Lactate      Current Outpatient Prescriptions  Medication Sig Dispense Refill  . ALPRAZolam (XANAX) 1 MG tablet Take 1 mg by mouth 3 (three) times daily.     Marland Kitchen. aspirin EC 81 MG tablet Take 81 mg by mouth daily.    . finasteride (PROSCAR) 5 MG tablet Take 5 mg by mouth daily.    Marland Kitchen. levothyroxine (SYNTHROID, LEVOTHROID) 150 MCG tablet Take 150 mcg by mouth daily.    . metoprolol succinate (TOPROL-XL) 50 MG 24 hr tablet Please take as directed 60 tablet 6  . nitroGLYCERIN (NITROSTAT) 0.4 MG SL tablet Place 1 tablet (0.4 mg total) under the tongue every 5 (five) minutes as needed for chest pain. 25 tablet 3  . Omega-3 Krill Oil 1000 MG CAPS Take 1,000 mg by mouth daily.    Marland Kitchen. PARoxetine (PAXIL) 20 MG tablet Take 20 mg by mouth every morning.    . tamsulosin (FLOMAX) 0.4 MG CAPS capsule Take 1 capsule by mouth as needed.     . furosemide (LASIX) 20 MG tablet Take 20 mg by mouth daily.     Marland Kitchen. lisinopril (PRINIVIL,ZESTRIL) 2.5 MG tablet Take 2.5 mg by mouth daily.      No current facility-administered medications for this visit.     Past Medical History  Diagnosis Date  . AF (atrial fibrillation) (HCC)   . Palpitations   . Anxiety and depression   . Hypothyroidism   . SVT (supraventricular tachycardia) (HCC)   . Obesity     ROS:   All systems reviewed and negative except as noted in the HPI.   Past Surgical History  Procedure Laterality Date  . Cholecystectomy    . Cardiovascular stress test  09/05/2007     Family  History  Problem Relation Age of Onset  . Heart disease Mother   . Heart disease Father      Social History   Social History  . Marital Status: Married    Spouse Name: N/A  . Number of Children: N/A  . Years of Education: N/A   Occupational History  . Not on file.   Social History Main Topics  . Smoking status: Former Games developermoker  . Smokeless tobacco: Not on file  . Alcohol Use: No  . Drug Use: No  . Sexual Activity: Not on file   Other Topics Concern  . Not on file   Social History Narrative     BP 106/74 mmHg  Pulse 56  Ht 5\' 10"  (1.778 m)  Wt 266 lb (120.657 kg)  BMI 38.17 kg/m2  SpO2 98%  Physical Exam:  Well appearing obese, middle aged man, NAD HEENT: Unremarkable Neck:  6 cm JVD, no thyromegally Back:  No CVA tenderness Lungs:  Clear with no wheezes HEART:  Regular rate rhythm, no murmurs, no rubs, no clicks Abd:  soft, positive bowel sounds, no organomegally, no rebound, no guarding Ext:  2 plus pulses, no edema, no cyanosis, no clubbing Skin:  No  rashes no nodules Neuro:  CN II through XII intact, motor grossly intact   Assess/Plan: 1. Chest pain - unclear if this is angina. He has multiple cardiac risks. I will have him undergo a lexiscan myoview as he previously tried a regular stress test and could not walk long enough to get his HR up before his back "gave out". If Lexiscan is abnormal, low threshold to do a heart cath. 2. Palpitations - his arrhythmias are not well characterized. He states that he has daily symptoms. He will undergo a 48 hour holter monitor and additional rec's will be made based on the result of his monitor. 3. Snoring - sounds like he has sleep apnea as he also notes daytime somnolence. Will plan a sleep study after problems #1 and 2 have been sorted out. 4. HTN - his blood pressure is controlled. 5. Obesity - he is nearly a hundred pounds over weight. We will work on this in the future.  Leonia Reeves.D.

## 2015-08-25 ENCOUNTER — Inpatient Hospital Stay (HOSPITAL_COMMUNITY): Admission: RE | Admit: 2015-08-25 | Payer: Medicare Other | Source: Ambulatory Visit

## 2015-08-25 ENCOUNTER — Ambulatory Visit (HOSPITAL_COMMUNITY)
Admission: RE | Admit: 2015-08-25 | Discharge: 2015-08-25 | Disposition: A | Discharge status: Home / Self Care | Payer: Medicare Other | Source: Ambulatory Visit | Attending: Internal Medicine | Admitting: Internal Medicine

## 2015-08-25 ENCOUNTER — Encounter (HOSPITAL_COMMUNITY)
Admission: RE | Admit: 2015-08-25 | Discharge: 2015-08-25 | Disposition: A | Discharge status: Home / Self Care | Payer: Medicare Other | Source: Ambulatory Visit | Attending: Internal Medicine | Admitting: Internal Medicine

## 2015-08-25 ENCOUNTER — Encounter (HOSPITAL_COMMUNITY): Payer: Self-pay

## 2015-08-25 DIAGNOSIS — R931 Abnormal findings on diagnostic imaging of heart and coronary circulation: Secondary | ICD-10-CM | POA: Insufficient documentation

## 2015-08-25 DIAGNOSIS — R002 Palpitations: Secondary | ICD-10-CM | POA: Insufficient documentation

## 2015-08-25 DIAGNOSIS — R072 Precordial pain: Secondary | ICD-10-CM | POA: Insufficient documentation

## 2015-08-25 HISTORY — DX: Essential (primary) hypertension: I10

## 2015-08-25 LAB — NM MYOCAR MULTI W/SPECT W/WALL MOTION / EF
CHL CUP NUCLEAR SSS: 4
LV dias vol: 79 mL (ref 62–150)
LV sys vol: 27 mL
NUC STRESS TID: 1.03
Peak HR: 90 {beats}/min
RATE: 0.33
Rest HR: 53 {beats}/min
SDS: 4
SRS: 0

## 2015-08-25 MED ORDER — REGADENOSON 0.4 MG/5ML IV SOLN
INTRAVENOUS | Status: AC
  Administered 2015-08-25: 0.4 mg via INTRAVENOUS
  Filled 2015-08-25: qty 5

## 2015-08-25 MED ORDER — TECHNETIUM TC 99M TETROFOSMIN IV KIT
30.0000 | PACK | Freq: Once | INTRAVENOUS | Status: AC | PRN
  Administered 2015-08-25: 29.3 via INTRAVENOUS

## 2015-08-25 MED ORDER — SODIUM CHLORIDE 0.9% FLUSH
INTRAVENOUS | Status: AC
  Administered 2015-08-25: 10 mL via INTRAVENOUS
  Filled 2015-08-25: qty 10

## 2015-08-25 MED ORDER — TECHNETIUM TC 99M TETROFOSMIN IV KIT
10.0000 | PACK | Freq: Once | INTRAVENOUS | Status: AC | PRN
  Administered 2015-08-25: 9.7 via INTRAVENOUS

## 2015-09-03 ENCOUNTER — Telehealth: Payer: Self-pay

## 2015-09-03 NOTE — Telephone Encounter (Signed)
PT was referred by Dr. Sherryll Burger for a screening colonoscopy and heme positive stool.  Called and [facility] for a return call from pt to schedule OV first.

## 2015-09-06 ENCOUNTER — Telehealth: Payer: Self-pay | Admitting: *Deleted

## 2015-09-06 NOTE — Telephone Encounter (Signed)
Antoine PocheJonathan F Branch, MD  Nori Riisatherine A Dywane Peruski, RN        Please copy into his chart. Heart monitor looks good, just some occasional extra heart beats but no abnormal rhythms. Can he clarify how he is taking his Toprol XL, we can consider increasing the dose a little bit to see if it helps with symptoms   Dominga FerryJ Branch MD   Previous Messages    ----- Message -----  From: Nori Riisatherine A Shaneya Taketa, RN  Sent: 09/06/2015  3:34 PM  To: Antoine PocheJonathan F Branch, MD  Subject: holter results                  Elenor Quinonesaylor pt,[place] away,will u result holter results please      Pt states he feels his "irregular beat" about once a day,not fast,just irregular,lasts less than an hr,pt watches caffeine,no alcholol

## 2015-09-15 ENCOUNTER — Ambulatory Visit: Payer: Medicare Other | Admitting: Urology

## 2015-09-24 NOTE — Telephone Encounter (Signed)
Letter mailed to pt to call.  

## 2015-10-08 NOTE — Telephone Encounter (Signed)
LMOM to call and schedule OV appt. Sending a letter to Dr. Sherryll BurgerShah that pt has not responded to previous call or letter.

## 2016-07-06 NOTE — Progress Notes (Signed)
Cardiology Office Note   Date:  07/07/2016   ID:  Justin [name], DOB 12-22-1950, MRN [id number]  PCP:  Kirstie PeriShah, Ashish, MD  Cardiologist:  Ladona Ridgelaylor Chief Complaint  Patient presents with  . Hypertension  . Dizziness      History of Present Illness: Justin [name] is a 66 y.o. male who presents for ongoing assessment and management of PAF, SVT, and hypertension. He has not been seen in the office since 07/2015. He complained of chest pain at that time and was scheduled for a NM stress test.Due to frequent palpitations, he was scheduled for a 48 hour Holter monitor.   Stress Test 08/25/2015  There was no ST segment deviation noted during stress.  Findings consistent with mild inferoapcal ischemia, cannot rule out slight differences in apical thinning as cause of defect. Either finding is low risk.  The left ventricular ejection fraction is hyperdynamic (>65%).  This is a low risk study.  Holter Monitor 08/26/2015 48 hour Holter monitor reviewed. Sinus rhythm and sinus tachycardia noted. Rare PACs with occasional fusion beats as well. Heart rate ranged from 42 bpm up to 120 bpm with average heart rate 65 bpm. There were no sustained arrhythmias or pauses.  He was [facility] [place] for complaints of neck pain and dizziness. He was diagnosed with musculoskeletal pain. He followed up with Dr. Sherryll BurgerShah, PCP with ongoing symptoms of dizziness. Metoprolol was decreased to 25 mg daily from 50 mg daily. He was continued on lasix and lisinopril. He has not taken the lisinopril as this causes more dizziness. He is otherwise compliant. He denies chest pain or heart racing on lower dose of metoprolol.    Past Medical History:  Diagnosis Date  . AF (atrial fibrillation) (HCC)   . Anxiety and depression   . Hypertension   . Hypothyroidism   . Obesity   . Palpitations   . SVT (supraventricular tachycardia) (HCC)     Past Surgical History:  Procedure Laterality Date  .  CARDIOVASCULAR STRESS TEST  09/05/2007  . CHOLECYSTECTOMY       Current Outpatient Prescriptions  Medication Sig Dispense Refill  . ALPRAZolam (XANAX) 1 MG tablet Take 1 mg by mouth 3 (three) times daily.     Marland Kitchen. aspirin EC 81 MG tablet Take 81 mg by mouth daily.    . finasteride (PROSCAR) 5 MG tablet Take 5 mg by mouth daily.    . furosemide (LASIX) 20 MG tablet Take 20 mg by mouth daily.     Marland Kitchen. levothyroxine (SYNTHROID, LEVOTHROID) 150 MCG tablet Take 150 mcg by mouth daily.    Marland Kitchen. lisinopril (PRINIVIL,ZESTRIL) 2.5 MG tablet Take 2.5 mg by mouth daily.     . nitroGLYCERIN (NITROSTAT) 0.4 MG SL tablet Place 1 tablet (0.4 mg total) under the tongue every 5 (five) minutes as needed for chest pain. 25 tablet 3  . Omega-3 Krill Oil 1000 MG CAPS Take 1,000 mg by mouth daily.    Marland Kitchen. PARoxetine (PAXIL) 20 MG tablet Take 20 mg by mouth every morning.    . tamsulosin (FLOMAX) 0.4 MG CAPS capsule Take 1 capsule by mouth as needed.     . furosemide (LASIX) 20 MG tablet Take 1 tablet (20 mg total) by mouth daily as needed. 90 tablet 3  . metoprolol succinate (TOPROL XL) 25 MG 24 hr tablet Take 0.5 tablets (12.5 mg total) by mouth daily. 45 tablet 3   No current facility-administered medications for this visit.  Allergies:   Haloperidol lactate    Social History:  The patient  reports that he has quit smoking. His smokeless tobacco use includes Snuff. He reports that he does not drink alcohol or use drugs.   Family History:  The patient's family history includes Heart disease in his father and mother.    ROS: All other systems are reviewed and negative. Unless otherwise mentioned in H&P    PHYSICAL EXAM: VS:  BP 108/70   Pulse 61   Ht 5\' 10"  (1.778 m)   Wt 269 lb (122 kg)   SpO2 96%   BMI 38.60 kg/m  , BMI Body mass index is 38.6 kg/m. GEN: Well nourished, well developed, in no acute distress Morbidly obese.  HEENT: normal  Neck: no JVD, carotid bruits, or masses Cardiac: RRR; no  murmurs, rubs, or gallops,no edema  Respiratory:  clear to auscultation bilaterally, normal work of breathing GI: soft, nontender, nondistended, + BS MS: no deformity or atrophy  Skin: warm and dry, no rash Neuro:  Strength and sensation are intact Psych: euthymic mood, full affect   EKG:  The ekg reviewed from recent hospitalization reveals sinus bradycardia with 1st degree AV block. HR 51 bpm.    Recent Labs: No results found for requested labs within last 8760 hours.    Lipid Panel No results found for: CHOL, TRIG, HDL, CHOLHDL, VLDL, LDLCALC, LDLDIRECT    Wt Readings from Last 3 Encounters:  07/07/16 269 lb (122 kg)  08/19/15 266 lb (120.7 kg)  03/21/13 275 lb (124.7 kg)      Other studies Reviewed: Echocardiogram/Stress Echo 1\30/2015 The estimated LV ejection fraction was 60%. - Normal wall motion; no LV regional wall motion abnormalities. Low dose:  - The estimated LV ejection fraction was 70%. - Normal wall motion; no LV regional wall motion abnormalities. Peak stress:  - The estimated LV ejection fraction was 75%. - Normal wall motion; no LV regional wall motion abnormalities. Recovery:  - The estimated LV ejection fraction was 70%. - Normal wall motion; no LV regional wall motion abnormalities.  ASSESSMENT AND PLAN:  1. Dizziness: Multifactorial. On several medications that can cause this. Orthostatic BP was completed in the clinic today.He was not found to be orthostatic, but remained hypotensive. Will make the following changes:   1. Discontinue daily lasix, and use prn. 2. Decrease metoprolol to 12.5 mg daily, and may take additional 12.5 if he feels racing HR or palpitations.  3. Continue lisinopril for cardiorenal protection in the setting of diabetes.   Consider decreasing Flomax to 0.2 mg from 0.4 mg as this can also cause positional dizziness. Will defer to PCP on this.   2. Hypertension: BP is low today. Reviewed latest echo. He has  normal LV function. NO evidence of diastolic dysfunction. Removing lasix may be more helpful for hypotension for now. He will come back in a couple of weeks for BP check on medication changes. See him again in one month for follow up.   3. Hx of Atrial fib; Recent Holter monitor did not reveal atrial fib. He is not on anticoagulation at this time.    Current medicines are reviewed at length with the patient today.    Labs/ tests ordered today include: BMET in one month  Bettey Mare. Liborio Nixon, ANP, AACC   07/07/2016 3:20 PM    [facility] [address] [place], [place] [address] Phone: [phone]; Fax: [phone]

## 2016-07-07 ENCOUNTER — Encounter: Payer: Self-pay | Admitting: Adult Health

## 2016-07-07 ENCOUNTER — Ambulatory Visit (INDEPENDENT_AMBULATORY_CARE_PROVIDER_SITE_OTHER): Payer: Medicare HMO | Admitting: Adult Health

## 2016-07-07 VITALS — BP 108/70 | HR 61 | Ht 70.0 in | Wt 269.0 lb

## 2016-07-07 DIAGNOSIS — Z79899 Other long term (current) drug therapy: Secondary | ICD-10-CM

## 2016-07-07 DIAGNOSIS — R42 Dizziness and giddiness: Secondary | ICD-10-CM

## 2016-07-07 DIAGNOSIS — I10 Essential (primary) hypertension: Secondary | ICD-10-CM | POA: Diagnosis not present

## 2016-07-07 DIAGNOSIS — I4891 Unspecified atrial fibrillation: Secondary | ICD-10-CM

## 2016-07-07 MED ORDER — FUROSEMIDE 20 MG PO TABS
20.0000 mg | ORAL_TABLET | Freq: Every day | ORAL | 3 refills | Status: DC | PRN
Start: 2016-07-07 — End: 2016-08-07

## 2016-07-07 MED ORDER — METOPROLOL SUCCINATE ER 25 MG PO TB24: 12.5000 mg | ORAL_TABLET | Freq: Every day | ORAL | 3 refills | Status: DC

## 2016-07-07 NOTE — Patient Instructions (Addendum)
Your physician recommends that you schedule a follow-up appointment in: 1 Month   Your physician has recommended you make the following change in your medication:   Change Lasix to only as needed for increased fluid   Decrease Metoprolol to 12.5 mg at Bedtime   Take Lisinopril 2.5 mg Daily in the Morning   Your physician recommends that you return for lab work in: 1 Month   If you need a refill on your cardiac medications before your next appointment, please call your pharmacy.   Thank you for choosing [facility]!

## 2016-08-03 ENCOUNTER — Other Ambulatory Visit (HOSPITAL_COMMUNITY)
Admission: RE | Admit: 2016-08-03 | Discharge: 2016-08-03 | Disposition: A | Discharge status: Home / Self Care | Payer: Medicare HMO | Source: Ambulatory Visit | Attending: Adult Health | Admitting: Adult Health

## 2016-08-03 DIAGNOSIS — I4891 Unspecified atrial fibrillation: Secondary | ICD-10-CM | POA: Diagnosis present

## 2016-08-03 DIAGNOSIS — Z79899 Other long term (current) drug therapy: Secondary | ICD-10-CM | POA: Diagnosis present

## 2016-08-03 LAB — BASIC METABOLIC PANEL
Anion gap: 10 (ref 5–15)
BUN: 19 mg/dL (ref 6–20)
CALCIUM: 8.9 mg/dL (ref 8.9–10.3)
CO2: 29 mmol/L (ref 22–32)
CREATININE: 1.15 mg/dL (ref 0.61–1.24)
Chloride: 99 mmol/L — ABNORMAL LOW (ref 101–111)
GFR calc non Af Amer: >60 mL/min (ref >60)
GLUCOSE: 112 mg/dL — AB (ref 65–99)
Potassium: 4.1 mmol/L (ref 3.5–5.1)
Sodium: 138 mmol/L (ref 135–145)

## 2016-08-07 ENCOUNTER — Ambulatory Visit (INDEPENDENT_AMBULATORY_CARE_PROVIDER_SITE_OTHER): Payer: Medicare HMO | Admitting: Adult Health

## 2016-08-07 ENCOUNTER — Encounter: Payer: Self-pay | Admitting: Adult Health

## 2016-08-07 VITALS — BP 116/72 | HR 69 | Ht 71.0 in | Wt 265.0 lb

## 2016-08-07 DIAGNOSIS — I10 Essential (primary) hypertension: Secondary | ICD-10-CM | POA: Diagnosis not present

## 2016-08-07 DIAGNOSIS — I480 Paroxysmal atrial fibrillation: Secondary | ICD-10-CM | POA: Diagnosis not present

## 2016-08-07 MED ORDER — METOPROLOL SUCCINATE ER 25 MG PO TB24: 12.5000 mg | ORAL_TABLET | Freq: Two times a day (BID) | ORAL | 3 refills | Status: DC

## 2016-08-07 MED ORDER — POTASSIUM CHLORIDE ER 10 MEQ PO TBCR: 10.0000 meq | EXTENDED_RELEASE_TABLET | Freq: Every day | ORAL | 3 refills | Status: DC

## 2016-08-07 NOTE — Progress Notes (Signed)
. Cardiology Office Note   Date:  08/07/2016   ID:  Justin [name], DOB 1950/08/29, MRN [id number]  PCP:  Justin [name], Ashish, MD  Cardiologist: Justin [name]  Chief Complaint  Patient presents with  . Atrial Fibrillation  . Hypertension      History of Present Illness: Justin [name] is a 66 y.o. male who presents for ongoing assessment and management of paroxysmal atrial fibrillation, history of SVT, and hypertension. He was last seen in the office on 07/07/2016 to discuss 40 hour Holter monitor and stress test. These tests were completed due to symptoms of dizziness.  On that office visit Lasix was discontinued and to be used when necessary, metoprolol was decreased to 12.5 mg daily, but could take an additional 12.5 mg if he felt his heart racing or having increasing palpitation. There was consideration to decrease Flomax 0.2 mg and 0.4 mg as that could also cause some mild positional dizziness. He was to talk with his PCP about this. He was found to be mildly hypotensive.  The patient on his own, has increased his metoprolol to 12.5 mg twice a day. He states he feels his blood pressure quite and he has a headache at night. He states that now that he has started taking it twice a day he feels better. He denies dizziness or rapid heart rhythm. He is also questioning whether had not his Justin [name] is healthy enough to have sexual activity.  Past Medical History:  Diagnosis Date  . AF (atrial fibrillation) (HCC)   . Anxiety and depression   . Hypertension   . Hypothyroidism   . Obesity   . Palpitations   . SVT (supraventricular tachycardia) (HCC)     Past Surgical History:  Procedure Laterality Date  . CARDIOVASCULAR STRESS TEST  09/05/2007  . CHOLECYSTECTOMY       Current Outpatient Prescriptions  Medication Sig Dispense Refill  . ALPRAZolam (XANAX) 1 MG tablet Take 1 mg by mouth 3 (three) times daily.     Marland Kitchen. aspirin EC 81 MG tablet Take 81 mg by mouth daily.    . finasteride  (PROSCAR) 5 MG tablet Take 5 mg by mouth daily.    . furosemide (LASIX) 20 MG tablet Take 20 mg by mouth daily.     Marland Kitchen. levothyroxine (SYNTHROID, LEVOTHROID) 150 MCG tablet Take 150 mcg by mouth daily.    Marland Kitchen. lisinopril (PRINIVIL,ZESTRIL) 2.5 MG tablet Take 2.5 mg by mouth daily.     . metoprolol succinate (TOPROL XL) 25 MG 24 hr tablet Take 0.5 tablets (12.5 mg total) by mouth 2 (two) times daily. 90 tablet 3  . nitroGLYCERIN (NITROSTAT) 0.4 MG SL tablet Place 1 tablet (0.4 mg total) under the tongue every 5 (five) minutes as needed for chest pain. 25 tablet 3  . Omega-3 Krill Oil 1000 MG CAPS Take 1,000 mg by mouth daily.    Marland Kitchen. PARoxetine (PAXIL) 20 MG tablet Take 20 mg by mouth every morning.    . tamsulosin (FLOMAX) 0.4 MG CAPS capsule Take 1 capsule by mouth as needed.     . potassium chloride (K-DUR) 10 MEQ tablet Take 1 tablet (10 mEq total) by mouth daily. 90 tablet 3   No current facility-administered medications for this visit.     Allergies:   Haloperidol lactate    Social History:  The patient  reports that he has quit smoking. His smokeless tobacco use includes Snuff. He reports that he does not drink alcohol or use drugs.   Family  History:  The patient's family history includes Heart disease in his father and mother.    ROS: All other systems are reviewed and negative. Unless otherwise mentioned in H&P    PHYSICAL EXAM: VS:  BP 116/72   Pulse 69   Ht 5\' 11"  (1.803 m)   Wt 265 lb (120.2 kg)   SpO2 97%   BMI 36.96 kg/m  , BMI Body mass index is 36.96 kg/m. GEN: Well nourished, well developed, in no acute distress Obese. HEENT: normal  Neck: no JVD, carotid bruits, or masses Cardiac: RRR; no murmurs, rubs, or gallops,no edema  Respiratory:  clear to auscultation bilaterally, normal work of breathing GI: soft, nontender, nondistended, + BS MS: no deformity or atrophy  Skin: warm and dry, no rash Neuro:  Strength and sensation are intact Psych: euthymic mood, full  affect   Recent Labs: 08/03/2016: BUN 19; Creatinine, Ser 1.15; Potassium 4.1; Sodium 138    Lipid Panel No results found for: CHOL, TRIG, HDL, CHOLHDL, VLDL, LDLCALC, LDLDIRECT    Wt Readings from Last 3 Encounters:  08/07/16 265 lb (120.2 kg)  07/07/16 269 lb (122 kg)  08/19/15 266 lb (120.7 kg)      Other studies Reviewed: NM Stress Test Study Result    There was no ST segment deviation noted during stress.  Findings consistent with mild inferoapcal ischemia, cannot rule out slight differences in apical thinning as cause of defect. Either finding is low risk.  The left ventricular ejection fraction is hyperdynamic (>65%).  This is a low risk study.    ASSESSMENT AND PLAN:  1. Hypertension: Blood pressure is currently controlled. He states he feels his blood pressure go up at night along with having a headache. He has been increasing his metoprolol succinate 12.5 mg 2 twice a day from daily. He states this makes him feel better in the evening. We'll continue this as he is having symptoms in the evening and they subside with taking metoprolol. He denies any dizziness or fatigue associated with this. He is also taking Lasix daily although we have asked him to take it as needed. He states that he does not like to have any fluid retention. I'm going to add 10 mEq of potassium to his medication regimen to avoid muscle aches and pains and fatigue. He states he does have the son occasionally but he will eat bananas.  It appears that he prefers to titrate his medication regimen pending on how he feels. We'll follow closely his report any problems associated with medications or symptoms.  I told him from a cardiac standpoint he can purchase patient's sexual activity. If he begins to have rapid heart rhythm that is not well-controlled or significant chest pain he is to report this to [place].  2. Paroxysmal atrial fibrillation: He denies any rapid heart rhythm or palpitations. We'll continue  him on current medication regimen with the addition of the 12.5 mg in the p.m. He is not on anticoagulation therapy currently.     Current medicines are reviewed at length with the patient today.    Labs/ tests ordered today include: None. Follow-up with Dr. Ladona Ridgel at his request.  Bettey Mare. Liborio Nixon, ANP, AACC   08/07/2016 2:41 PM    [facility] [address] [place], [place] [address] Phone: [phone]; Fax: [phone]

## 2016-08-07 NOTE — Patient Instructions (Signed)
Medication Instructions:  START POTASSIUM 10 MEQ DAILY  START METOPROLOL 12.5 MG IN THE MORNING & 12.5 MG IN THE EVENING  Labwork: NONE  Testing/Procedures: NONE  Follow-Up: Your physician wants you to follow-up in: 6 MONTHS WITH DR. Ladona RidgelAYLOR. You will receive a reminder letter in the mail two months in advance. If you don't receive a letter, please call our office to schedule the follow-up appointment.   Any Other Special Instructions Will Be Listed Below (If Applicable).     If you need a refill on your cardiac medications before your next appointment, please call your pharmacy.

## 2016-11-08 ENCOUNTER — Ambulatory Visit (INDEPENDENT_AMBULATORY_CARE_PROVIDER_SITE_OTHER): Payer: Medicare HMO | Admitting: Urology

## 2016-11-08 DIAGNOSIS — N401 Enlarged prostate with lower urinary tract symptoms: Secondary | ICD-10-CM

## 2016-11-08 DIAGNOSIS — R102 Pelvic and perineal pain: Secondary | ICD-10-CM | POA: Diagnosis not present

## 2016-11-20 ENCOUNTER — Emergency Department (HOSPITAL_COMMUNITY): Payer: Medicare HMO

## 2016-11-20 ENCOUNTER — Encounter (HOSPITAL_COMMUNITY): Payer: Self-pay | Admitting: Emergency Medicine

## 2016-11-20 ENCOUNTER — Emergency Department (HOSPITAL_COMMUNITY)
Admission: EM | Admit: 2016-11-20 | Discharge: 2016-11-20 | Disposition: A | Discharge status: Home / Self Care | Payer: Medicare HMO | Attending: Emergency Medicine | Admitting: Emergency Medicine

## 2016-11-20 DIAGNOSIS — Z79899 Other long term (current) drug therapy: Secondary | ICD-10-CM | POA: Insufficient documentation

## 2016-11-20 DIAGNOSIS — R0600 Dyspnea, unspecified: Secondary | ICD-10-CM | POA: Diagnosis not present

## 2016-11-20 DIAGNOSIS — E039 Hypothyroidism, unspecified: Secondary | ICD-10-CM | POA: Diagnosis not present

## 2016-11-20 DIAGNOSIS — F17290 Nicotine dependence, other tobacco product, uncomplicated: Secondary | ICD-10-CM | POA: Diagnosis not present

## 2016-11-20 DIAGNOSIS — I10 Essential (primary) hypertension: Secondary | ICD-10-CM | POA: Diagnosis not present

## 2016-11-20 DIAGNOSIS — R0601 Orthopnea: Secondary | ICD-10-CM | POA: Diagnosis present

## 2016-11-20 LAB — BASIC METABOLIC PANEL
Anion gap: 9 (ref 5–15)
BUN: 16 mg/dL (ref 6–20)
CO2: 30 mmol/L (ref 22–32)
Calcium: 8.8 mg/dL — ABNORMAL LOW (ref 8.9–10.3)
Chloride: 101 mmol/L (ref 101–111)
Creatinine, Ser: 1.33 mg/dL — ABNORMAL HIGH (ref 0.61–1.24)
GFR calc Af Amer: >60 mL/min (ref >60)
GFR calc non Af Amer: 54 mL/min — ABNORMAL LOW (ref >60)
Glucose, Bld: 107 mg/dL — ABNORMAL HIGH (ref 65–99)
Potassium: 3.5 mmol/L (ref 3.5–5.1)
Sodium: 140 mmol/L (ref 135–145)

## 2016-11-20 LAB — URINALYSIS, ROUTINE W REFLEX MICROSCOPIC
Bilirubin Urine: NEGATIVE
Glucose, UA: NEGATIVE mg/dL
Hgb urine dipstick: NEGATIVE
Ketones, ur: NEGATIVE mg/dL
Leukocytes, UA: NEGATIVE
Nitrite: NEGATIVE
Protein, ur: NEGATIVE mg/dL
Specific Gravity, Urine: 1.015 (ref 1.005–1.030)
pH: 5 (ref 5.0–8.0)

## 2016-11-20 LAB — CBC WITH DIFFERENTIAL/PLATELET
Basophils Absolute: 0 10*3/uL (ref 0.0–0.1)
Basophils Relative: 0 %
Eosinophils Absolute: 0.5 10*3/uL (ref 0.0–0.7)
Eosinophils Relative: 7 %
HCT: 42.3 % (ref 39.0–52.0)
Hemoglobin: 14.6 g/dL (ref 13.0–17.0)
Lymphocytes Relative: 31 %
Lymphs Abs: 2.5 10*3/uL (ref 0.7–4.0)
MCH: 33.3 pg (ref 26.0–34.0)
MCHC: 34.5 g/dL (ref 30.0–36.0)
MCV: 96.6 fL (ref 78.0–100.0)
Monocytes Absolute: 0.6 10*3/uL (ref 0.1–1.0)
Monocytes Relative: 7 %
Neutro Abs: 4.3 10*3/uL (ref 1.7–7.7)
Neutrophils Relative %: 55 %
Platelets: 139 10*3/uL — ABNORMAL LOW (ref 150–400)
RBC: 4.38 MIL/uL (ref 4.22–5.81)
RDW: 12.4 % (ref 11.5–15.5)
WBC: 7.9 10*3/uL (ref 4.0–10.5)

## 2016-11-20 LAB — BRAIN NATRIURETIC PEPTIDE: B Natriuretic Peptide: 54 pg/mL (ref 0.0–100.0)

## 2016-11-20 LAB — TROPONIN I: Troponin I: <0.03 ng/mL (ref <0.03)

## 2016-11-20 MED ORDER — PANTOPRAZOLE SODIUM 20 MG PO TBEC: 20.0000 mg | DELAYED_RELEASE_TABLET | Freq: Every day | ORAL | 0 refills | Status: DC

## 2016-11-20 MED ORDER — PANTOPRAZOLE SODIUM 40 MG PO TBEC
40.0000 mg | DELAYED_RELEASE_TABLET | Freq: Once | ORAL | Status: AC
  Administered 2016-11-20: 40 mg via ORAL
  Filled 2016-11-20: qty 1

## 2016-11-20 MED ORDER — FAMOTIDINE 20 MG PO TABS
20.0000 mg | ORAL_TABLET | Freq: Once | ORAL | Status: AC
  Administered 2016-11-20: 20 mg via ORAL
  Filled 2016-11-20: qty 1

## 2016-11-20 NOTE — Discharge Instructions (Signed)
I think your symptoms may actually be from reflux (GERD). It can cause more typical "heart burn" but also potentially less typical symptoms such as cough, orthopnea (short of breath when laying on back), sensation of something in the chest, etc.   You are being started on protonix. I want you to follow-up with your PCP.

## 2016-11-20 NOTE — ED Triage Notes (Signed)
Pt c/o abd bloating, left groin pain, cough, sob, and states he is not making enough urine since Saturday. Pt states he was seen for the same are [facility] Saturday.

## 2016-11-20 NOTE — ED Notes (Signed)
Patient transported to X-ray 

## 2016-11-20 NOTE — ED Provider Notes (Signed)
[facility] Provider Note   CSN: [id number] Arrival date & time: 11/20/16  1858     History   Chief Complaint Chief Complaint  Patient presents with  . Multiple complaints    HPI Justin [name] is a 66 y.o. male.  HPI   66 year old male with cough.  Onset Saturday.  Persistent since then.  Nonproductive.  Endorses orthopnea.  Mild lower extremity swelling.  Vague sensation of needing to clear his throat but not really pain.  He [facility] ago for the same complaints.  He had extensive workup at that time.  He was advised to increase his Lasix.  He has not noticed any significant change in his symptoms despite this.  Past Medical History:  Diagnosis Date  . AF (atrial fibrillation) (HCC)   . Anxiety and depression   . Hypertension   . Hypothyroidism   . Obesity   . Palpitations   . SVT (supraventricular tachycardia) [facility](HCC)     Patient Active Problem List   Diagnosis Date Noted  . Chest pressure 02/07/2013  . OBESITY, UNSPECIFIED 03/24/2009  . SUPRAVENTRICULAR TACHYCARDIA 03/15/2009  . HYPOTHYROIDISM 03/12/2009  . DEPRESSION/ANXIETY 03/12/2009  . ATRIAL FIBRILLATION 03/12/2009  . PALPITATIONS 03/12/2009    Past Surgical History:  Procedure Laterality Date  . CARDIOVASCULAR STRESS TEST  09/05/2007  . CHOLECYSTECTOMY         Home Medications    Prior to Admission medications   Medication Sig Start Date End Date Taking? Authorizing Provider  ALPRAZolam Prudy Feeler(XANAX) 1 MG tablet Take 1 mg by mouth 3 (three) times daily.     [provider]  aspirin EC 81 MG tablet Take 81 mg by mouth daily.    [provider]  finasteride (PROSCAR) 5 MG tablet Take 5 mg by mouth daily.    [provider]  furosemide (LASIX) 20 MG tablet Take 20 mg by mouth daily.  08/03/15   [provider]  levothyroxine (SYNTHROID, LEVOTHROID) 150 MCG tablet Take 150 mcg by mouth daily.    [provider]    lisinopril (PRINIVIL,ZESTRIL) 2.5 MG tablet Take 2.5 mg by mouth daily.  08/03/15   [provider]  metoprolol succinate (TOPROL XL) 25 MG 24 hr tablet Take 0.5 tablets (12.5 mg total) by mouth 2 (two) times daily. 08/07/16   Jodelle GrossLawrence, Kathryn M, NP  nitroGLYCERIN (NITROSTAT) 0.4 MG SL tablet Place 1 tablet (0.4 mg total) under the tongue every 5 (five) minutes as needed for chest pain. 08/19/15   Marinus Mawaylor, Gregg W, MD  Omega-3 Krill Oil 1000 MG CAPS Take 1,000 mg by mouth daily.    [provider]  PARoxetine (PAXIL) 20 MG tablet Take 20 mg by mouth every morning.    [provider]  potassium chloride (K-DUR) 10 MEQ tablet Take 1 tablet (10 mEq total) by mouth daily. 08/07/16 11/05/16  Jodelle GrossLawrence, Kathryn M, NP  tamsulosin (FLOMAX) 0.4 MG CAPS capsule Take 1 capsule by mouth as needed.  02/04/13   [provider]    Family History Family History  Problem Relation Age of Onset  . Heart disease Mother   . Heart disease Father     Social History Social History  Substance Use Topics  . Smoking status: Former Games developermoker  . Smokeless tobacco: Current User    Types: Snuff  . Alcohol use No     Allergies   Haloperidol lactate   Review of Systems Review of Systems  All systems reviewed and negative, other than as noted in HPI.  Physical Exam Updated Vital Signs BP 119/71   Pulse 71   Temp 98.7 F (37.1 C)   Resp 18   Ht 5\' 10"  (1.778 m)   Wt 122.5 kg (270 lb)   SpO2 98%   BMI 38.74 kg/m   Physical Exam  Constitutional: He appears well-developed and well-nourished. No distress.  HENT:  Head: Normocephalic and atraumatic.  Eyes: Conjunctivae are normal. Right eye exhibits no discharge. Left eye exhibits no discharge.  Neck: Neck supple.  Cardiovascular: Normal rate, regular rhythm and normal heart sounds.  Exam reveals no gallop and no friction rub.   No murmur heard. Pulmonary/Chest: Effort normal and breath sounds normal. No respiratory distress.   Abdominal: Soft. He exhibits no distension. There is no tenderness.  Musculoskeletal: He exhibits edema. He exhibits no tenderness.  Minimal symmetric Le edema  Neurological: He is alert.  Skin: Skin is warm and dry.  Psychiatric: He has a normal mood and affect. His behavior is normal. Thought content normal.  Nursing note and vitals reviewed.    ED Treatments / Results  Labs (all labs ordered are listed, but only abnormal results are displayed) Labs Reviewed  CBC WITH DIFFERENTIAL/PLATELET - Abnormal; Notable for the following:       Result Value   Platelets 139 (*)    All other components within normal limits  BASIC METABOLIC PANEL - Abnormal; Notable for the following:    Glucose, Bld 107 (*)    Creatinine, Ser 1.33 (*)    Calcium 8.8 (*)    GFR calc non Af Amer 54 (*)    All other components within normal limits  BRAIN NATRIURETIC PEPTIDE  TROPONIN I  URINALYSIS, ROUTINE W REFLEX MICROSCOPIC    EKG  EKG Interpretation  Date/Time:  Monday November 20 2016 20:22:11 EDT Ventricular Rate:  61 PR Interval:    QRS Duration: 91 QT Interval:  443 QTC Calculation: 447 R Axis:   11 Text Interpretation:  Sinus rhythm Prolonged PR interval Low voltage, precordial leads Confirmed by Raeford Razor [phone]) on 11/20/2016 8:40:54 PM       Radiology Dg Chest 2 View  Result Date: 11/20/2016 CLINICAL DATA:  Dyspnea with chest pain EXAM: CHEST  2 VIEW COMPARISON:  11/18/2016 FINDINGS: No focal consolidation or effusion. Mild bronchitic changes. Stable cardiomediastinal silhouette. Atelectasis at the lingula and left base. No pneumothorax. Degenerative changes of the spine. Surgical clips in the right upper quadrant IMPRESSION: Mild bronchitic changes.  No focal consolidation or edema. Electronically Signed   By: Jasmine Pang M.D.   On: 11/20/2016 20:35    Procedures Procedures (including critical care time)  Medications Ordered in ED Medications - No data to  display   Initial Impression / Assessment and Plan / ED Course  I have reviewed the triage vital signs and the nursing notes.  Pertinent labs & imaging results that were available during my care of the patient were reviewed by me and considered in my medical decision making (see chart for details).   He was seen in the ED at [facility] and had an extensive work-up two days ago which I reviewed. CXR (clear), BNP (normal), d-dimer (normal), troponin x2, (normal), ABG (not completely normal, but overall unremarkable), CBC, CMP, influenza, strep, and UA. All testing reassuring.   He is c/o LE edema but minimal on my exam. Lungs sounds clear. o2 sats normal. The testing I did today in  the ED wasn't nearly as extensive, but again reassuring. This doesn't appear to be ACS, decompensated HF, pulmonary embolism or another emergent process.  Overall, I don't have much to objectively explain his symptoms and his exam is reassuring. I suspect his actually may be GERD. It could explain the vague sensation in his chest, non-productive cough, orthopnea, etc. When talking with him about this he said he did have "heart burn" about a week ago which is unusual for him and not something he has had in quite some time. He reports that he was previously on tagamet but not in several years. Will give a trial of protonix. Cr a little bumped from a couple days ago. Advised to go back to previous dosing and FU with PCP.   Final Clinical Impressions(s) / ED Diagnoses   Final diagnoses:  Dyspnea, unspecified type    New Prescriptions New Prescriptions   No medications on file     Raeford Razor, MD 11/22/16 0001

## 2017-07-06 ENCOUNTER — Ambulatory Visit: Payer: Medicare HMO | Admitting: Urology

## 2017-07-06 DIAGNOSIS — R102 Pelvic and perineal pain: Secondary | ICD-10-CM | POA: Diagnosis not present

## 2017-07-06 DIAGNOSIS — N401 Enlarged prostate with lower urinary tract symptoms: Secondary | ICD-10-CM

## 2017-07-09 ENCOUNTER — Other Ambulatory Visit: Payer: Self-pay | Admitting: Urology

## 2017-07-09 ENCOUNTER — Other Ambulatory Visit: Payer: Self-pay | Admitting: Adult Health

## 2017-07-18 ENCOUNTER — Emergency Department (HOSPITAL_COMMUNITY)
Admission: EM | Admit: 2017-07-18 | Discharge: 2017-07-18 | Disposition: A | Discharge status: Home / Self Care | Payer: Medicare HMO | Attending: Emergency Medicine | Admitting: Emergency Medicine

## 2017-07-18 ENCOUNTER — Other Ambulatory Visit: Payer: Self-pay

## 2017-07-18 ENCOUNTER — Telehealth: Payer: Self-pay | Admitting: Internal Medicine

## 2017-07-18 ENCOUNTER — Emergency Department (HOSPITAL_COMMUNITY): Payer: Medicare HMO

## 2017-07-18 ENCOUNTER — Encounter (HOSPITAL_COMMUNITY): Payer: Self-pay | Admitting: Emergency Medicine

## 2017-07-18 DIAGNOSIS — M542 Cervicalgia: Secondary | ICD-10-CM | POA: Insufficient documentation

## 2017-07-18 DIAGNOSIS — R072 Precordial pain: Secondary | ICD-10-CM | POA: Insufficient documentation

## 2017-07-18 DIAGNOSIS — Z79899 Other long term (current) drug therapy: Secondary | ICD-10-CM | POA: Diagnosis not present

## 2017-07-18 DIAGNOSIS — Z7982 Long term (current) use of aspirin: Secondary | ICD-10-CM | POA: Insufficient documentation

## 2017-07-18 DIAGNOSIS — E039 Hypothyroidism, unspecified: Secondary | ICD-10-CM | POA: Diagnosis not present

## 2017-07-18 DIAGNOSIS — Z87891 Personal history of nicotine dependence: Secondary | ICD-10-CM | POA: Diagnosis not present

## 2017-07-18 DIAGNOSIS — R0602 Shortness of breath: Secondary | ICD-10-CM

## 2017-07-18 DIAGNOSIS — M79601 Pain in right arm: Secondary | ICD-10-CM | POA: Diagnosis not present

## 2017-07-18 DIAGNOSIS — I10 Essential (primary) hypertension: Secondary | ICD-10-CM | POA: Insufficient documentation

## 2017-07-18 DIAGNOSIS — M79602 Pain in left arm: Secondary | ICD-10-CM | POA: Insufficient documentation

## 2017-07-18 DIAGNOSIS — R42 Dizziness and giddiness: Secondary | ICD-10-CM | POA: Insufficient documentation

## 2017-07-18 DIAGNOSIS — Z8669 Personal history of other diseases of the nervous system and sense organs: Secondary | ICD-10-CM | POA: Diagnosis not present

## 2017-07-18 DIAGNOSIS — I672 Cerebral atherosclerosis: Secondary | ICD-10-CM | POA: Diagnosis not present

## 2017-07-18 LAB — CBC WITH DIFFERENTIAL/PLATELET
BASOS PCT: 1 %
Basophils Absolute: 0 10*3/uL (ref 0.0–0.1)
Eosinophils Absolute: 0.6 10*3/uL (ref 0.0–0.7)
Eosinophils Relative: 10 %
HEMATOCRIT: 41.4 % (ref 39.0–52.0)
HEMOGLOBIN: 14 g/dL (ref 13.0–17.0)
LYMPHS PCT: 27 %
Lymphs Abs: 1.7 10*3/uL (ref 0.7–4.0)
MCH: 32.9 pg (ref 26.0–34.0)
MCHC: 33.8 g/dL (ref 30.0–36.0)
MCV: 97.4 fL (ref 78.0–100.0)
MONOS PCT: 9 %
Monocytes Absolute: 0.5 10*3/uL (ref 0.1–1.0)
NEUTROS ABS: 3.4 10*3/uL (ref 1.7–7.7)
NEUTROS PCT: 53 %
Platelets: 136 10*3/uL — ABNORMAL LOW (ref 150–400)
RBC: 4.25 MIL/uL (ref 4.22–5.81)
RDW: 12.6 % (ref 11.5–15.5)
WBC: 6.3 10*3/uL (ref 4.0–10.5)

## 2017-07-18 LAB — BASIC METABOLIC PANEL
Anion gap: 7 (ref 5–15)
BUN: 14 mg/dL (ref 6–20)
CHLORIDE: 107 mmol/L (ref 101–111)
CO2: 24 mmol/L (ref 22–32)
Calcium: 8.2 mg/dL — ABNORMAL LOW (ref 8.9–10.3)
Creatinine, Ser: 1.04 mg/dL (ref 0.61–1.24)
GFR calc non Af Amer: >60 mL/min (ref >60)
Glucose, Bld: 92 mg/dL (ref 65–99)
Potassium: 3.5 mmol/L (ref 3.5–5.1)
SODIUM: 138 mmol/L (ref 135–145)

## 2017-07-18 LAB — BRAIN NATRIURETIC PEPTIDE: B Natriuretic Peptide: 25 pg/mL (ref 0.0–100.0)

## 2017-07-18 LAB — TROPONIN I: Troponin I: <0.03 ng/mL (ref <0.03)

## 2017-07-18 LAB — D-DIMER, QUANTITATIVE (NOT AT ARMC): D DIMER QUANT: 0.54 ug{FEU}/mL — AB (ref 0.00–0.50)

## 2017-07-18 MED ORDER — IOPAMIDOL (ISOVUE-370) INJECTION 76%
75.0000 mL | Freq: Once | INTRAVENOUS | Status: AC | PRN
  Administered 2017-07-18: 75 mL via INTRAVENOUS

## 2017-07-18 NOTE — Telephone Encounter (Signed)
New message     1. Are you currently SOB (can you hear that pt is SOB on the phone)?  "some"  2. How long have you been experiencing SOB? 2 weeks  3. Are you SOB when sitting or when up moving around? Moving around  4. Are you currently experiencing any other symptoms? Dizziness, occasional chest pain.  Patient calling with concerns about arm, neck and leg pain.  Patient declined Friday appointment.

## 2017-07-18 NOTE — Telephone Encounter (Signed)
Returned call to Pt. Pt with intermittent chest pain radiating to left arm and side of his neck. Pt has accompanying shortness of breath and and unable to complete his morning walks.  This has been ongoing for 2 weeks but is not resolving. Pt has not taken any of his nitroglycerin. Pt is very concerned and not sure he can wait until Friday to be seen. Encouraged Pt to [facility]. Will continue to monitor for possible follow up with Dr. Ladona Ridgelaylor.

## 2017-07-18 NOTE — ED Provider Notes (Signed)
[facility] EMERGENCY DEPARTMENT Provider Note   CSN: [id number] Arrival date & time: 07/18/17  1150     History   Chief Complaint Chief Complaint  Patient presents with  . Shortness of Breath    HPI Justin [name] is a 67 y.o. male.  Patient with presentation of multiple complaints.  Main complaint was bilateral upper chest pain radiating to shoulders and neck with shortness of breath during exertion for about a month.  Is intermittent but is present more often than not.  Patient is also had decreased energy.  Also had some dizziness.  No substernal chest pain no numbness or weakness in the hands.  Patient followed by cardiology for history of supraventricular tachycardia and also seems like atrial fibrillation.  Patient states he is on beta-blockers.  He is followed by cardiology here.  Seen about once a year.  Last seen in the fall 2018.     Past Medical History:  Diagnosis Date  . AF (atrial fibrillation) (HCC)   . Anxiety and depression   . Hypertension   . Hypothyroidism   . Obesity   . Palpitations   . SVT (supraventricular tachycardia) [facility])     Patient Active Problem List   Diagnosis Date Noted  . Chest pressure 02/07/2013  . OBESITY, UNSPECIFIED 03/24/2009  . SUPRAVENTRICULAR TACHYCARDIA 03/15/2009  . HYPOTHYROIDISM 03/12/2009  . DEPRESSION/ANXIETY 03/12/2009  . ATRIAL FIBRILLATION 03/12/2009  . PALPITATIONS 03/12/2009    Past Surgical History:  Procedure Laterality Date  . CARDIOVASCULAR STRESS TEST  09/05/2007  . CHOLECYSTECTOMY          Home Medications    Prior to Admission medications   Medication Sig Start Date End Date Taking? Authorizing Provider  ALPRAZolam Prudy Feeler) 1 MG tablet Take 1 mg by mouth 3 (three) times daily.    Yes [provider]  aspirin EC 81 MG tablet Take 81 mg by mouth daily.   Yes [provider]  finasteride (PROSCAR) 5 MG tablet Take 5 mg by mouth daily.   Yes [provider]  furosemide  (LASIX) 20 MG tablet TAKE ONE TABLET BY MOUTH EVERY DAY AS NEEDED 07/09/17  Yes Jodelle Gross, NP  levothyroxine (SYNTHROID, LEVOTHROID) 125 MCG tablet Take 125 mcg by mouth daily. 06/19/17  Yes [provider]  losartan (COZAAR) 25 MG tablet Take 1 tablet by mouth daily. 07/16/17  Yes [provider]  metoprolol succinate (TOPROL-XL) 25 MG 24 hr tablet TAKE 1/2 TABLET BY MOUTH TWICE DAILY 07/09/17  Yes Jodelle Gross, NP  Omega-3 Krill Oil 1000 MG CAPS Take 1,000 mg by mouth daily.   Yes [provider]  PARoxetine (PAXIL) 20 MG tablet Take 10 mg by mouth every morning.    Yes [provider]  potassium chloride (K-DUR,KLOR-CON) 10 MEQ tablet TAKE ONE TABLET BY MOUTH EVERY DAY 07/09/17  Yes Jodelle Gross, NP  tamsulosin (FLOMAX) 0.4 MG CAPS capsule Take 1 capsule by mouth as needed.  02/04/13  Yes [provider]  nitroGLYCERIN (NITROSTAT) 0.4 MG SL tablet Place 1 tablet (0.4 mg total) under the tongue every 5 (five) minutes as needed for chest pain. 08/19/15   Marinus Maw, MD    Family History Family History  Problem Relation Age of Onset  . Heart disease Mother   . Heart disease Father     Social History Social History   Tobacco Use  . Smoking status: Former Games developer  . Smokeless tobacco: Current User  Types: Snuff  Substance Use Topics  . Alcohol use: No  . Drug use: No     Allergies   Haloperidol lactate   Review of Systems Review of Systems  Constitutional: Positive for fatigue. Negative for fever.  HENT: Negative for congestion.   Eyes: Negative for redness.  Respiratory: Positive for shortness of breath.   Cardiovascular: Positive for chest pain.  Gastrointestinal: Negative for abdominal pain and nausea.  Genitourinary: Negative for dysuria.  Musculoskeletal: Positive for neck pain. Negative for back pain.  Skin: Negative for rash.  Neurological: Positive for dizziness and light-headedness. Negative for  syncope, facial asymmetry, speech difficulty, weakness, numbness and headaches.  Psychiatric/Behavioral: Negative for confusion.     Physical Exam Updated Vital Signs BP (!) 103/54   Pulse (!) 58   Temp 98.3 F (36.8 C) (Oral)   Resp 15   Ht 1.778 m (5\' 10" )   Wt 122.5 kg (270 lb)   SpO2 98%   BMI 38.74 kg/m   Physical Exam  Constitutional: He appears well-developed and well-nourished. No distress.  HENT:  Head: Normocephalic and atraumatic.  Mouth/Throat: Oropharynx is clear and moist.  Eyes: Pupils are equal, round, and reactive to light. Conjunctivae and EOM are normal.  Neck: Neck supple.  Cardiovascular: Normal rate, regular rhythm and normal heart sounds.  Pulmonary/Chest: Effort normal and breath sounds normal. No respiratory distress. He exhibits no tenderness.  Abdominal: Soft. Bowel sounds are normal. He exhibits no distension. There is no tenderness.  Musculoskeletal: Normal range of motion.  Trace edema  Neurological: He is alert. No cranial nerve deficit or sensory deficit. He exhibits normal muscle tone. Coordination normal.  Skin: Skin is warm.  Nursing note and vitals reviewed.    ED Treatments / Results  Labs (all labs ordered are listed, but only abnormal results are displayed) Labs Reviewed  CBC WITH DIFFERENTIAL/PLATELET - Abnormal; Notable for the following components:      Result Value   Platelets 136 (*)    All other components within normal limits  BASIC METABOLIC PANEL - Abnormal; Notable for the following components:   Calcium 8.2 (*)    All other components within normal limits  D-DIMER, QUANTITATIVE (NOT AT [facility]) - Abnormal; Notable for the following components:   D-Dimer, Quant 0.54 (*)    All other components within normal limits  TROPONIN I  BRAIN NATRIURETIC PEPTIDE    EKG EKG Interpretation  Date/Time:  Wednesday July 18 2017 12:06:22 EDT Ventricular Rate:  54 PR Interval:    QRS Duration: 95 QT Interval:  429 QTC  Calculation: 407 R Axis:   45 Text Interpretation:  Sinus rhythm Atrial premature complex Prolonged PR interval Low voltage, extremity and precordial leads Confirmed by Vanetta Mulders [phone]) on 07/18/2017 12:23:31 PM   Radiology Dg Chest 2 View  Result Date: 07/18/2017 CLINICAL DATA:  Shortness of breath, chest pressure and right arm pain for 8 months. EXAM: CHEST - 2 VIEW COMPARISON:  PA and lateral chest 03/19/2017 and 11/20/2016. FINDINGS: The lungs are clear. Heart size is normal. Aortic atherosclerosis is noted. No pneumothorax or pleural effusion. No acute bony abnormality. IMPRESSION: No acute disease. Atherosclerosis. Electronically Signed   By: Drusilla Kanner M.D.   On: 07/18/2017 13:48   Ct Head Wo Contrast  Result Date: 07/18/2017 CLINICAL DATA:  67 year old male with neck and bilateral arm pain for 1 month. Shortness of breath on exertion. A fib. SVT. EXAM: CT HEAD WITHOUT CONTRAST CT CERVICAL SPINE WITHOUT CONTRAST TECHNIQUE: Multidetector  CT imaging of the head and cervical spine was performed following the standard protocol without intravenous contrast. Multiplanar CT image reconstructions of the cervical spine were also generated. COMPARISON:  Doppler ultrasound 07/18/2017. FINDINGS: CT HEAD FINDINGS Brain: Cerebral volume is within normal limits for age. No midline shift, ventriculomegaly, mass effect, evidence of mass lesion, intracranial hemorrhage or evidence of cortically based acute infarction. Gray-white matter differentiation is within normal limits for age. No cortical encephalomalacia identified. Vascular: Mild Calcified atherosclerosis at the skull base. Dominant distal right vertebral artery. No suspicious intracranial vascular hyperdensity. Skull: No acute osseous abnormality identified. Sinuses/Orbits: Scattered mild to moderate bilateral paranasal sinus mucosal thickening. Small mucous retention cyst in the right maxillary sinus. The tympanic cavities and mastoids are  clear. Small volume retained secretions in the nasopharynx. Polypoid posterior nasal cavity opacity (series 7, image 1). Other: Visualized orbit soft tissues are within normal limits. No acute scalp soft tissue finding identified. Suspected right posterior convexity scalp soft tissue scarring on series 7, image 45. Underlying calvarium intact. CT CERVICAL SPINE FINDINGS Alignment: Straightening of cervical lordosis. Cervicothoracic junction alignment is within normal limits. Bilateral posterior element alignment is within normal limits. Skull base and vertebrae: Visualized skull base is intact. No atlanto-occipital dissociation. No cervical spine fracture identified. Soft tissues and spinal canal: No prevertebral fluid or swelling. No visible canal hematoma. Negative noncontrast paraspinal soft tissues. Disc levels: Bulky osteophytosis including about the anterior C1-odontoid, and also along the anterior vertebral endplates C4-C5 through C7-T1. Associated C6-C7 and possibly also C7-T1 interbody ankylosis related to the bridging osteophytes. Relatively preserved disc spaces, but superimposed circumferential endplate spurring. Multilevel mild facet hypertrophy. Overall there is up to mild degenerative lower cervical spinal stenosis, maximal at C5-C6 and C6-C7. Upper chest: The visible upper thoracic levels appear intact. Negative lung apices. Negative noncontrast thoracic inlet. IMPRESSION: 1. No acute traumatic injury identified in the head or cervical spine. 2.  Normal for age non contrast CT appearance of the brain. 3. Cervical spine Diffuse idiopathic skeletal hyperostosis (DISH). Bulky flowing anterior endplate osteophytes. Subsequent C6-C7 and C7-T1 interbody ankylosis. Mild multifactorial lower cervical spinal stenosis suspected. 4. Possible Sinonasal Polyposis. Mild-to-moderate sinonasal inflammation. Electronically Signed   By: Odessa Fleming M.D.   On: 07/18/2017 16:22   Ct Angio Chest Pe W/cm &/or Wo  Cm  Result Date: 07/18/2017 CLINICAL DATA:  Shortness of breath with exertion for a month. Bilateral arm pain radiating to neck. History of atrial fibrillation. EXAM: CT ANGIOGRAPHY CHEST WITH CONTRAST TECHNIQUE: Multidetector CT imaging of the chest was performed using the standard protocol during bolus administration of intravenous contrast. Multiplanar CT image reconstructions and MIPs were obtained to evaluate the vascular anatomy. CONTRAST:  75mL ISOVUE-370 IOPAMIDOL (ISOVUE-370) INJECTION 76% COMPARISON:  Chest radiograph July 18, 2017 and CTA chest March 26, 2010 FINDINGS: CARDIOVASCULAR: Adequate contrast opacification of the pulmonary artery's. Main pulmonary artery is not enlarged. No pulmonary arterial filling defects to the level of the subsegmental branches. Heart size is normal, no right heart strain. Trace coronary artery calcifications. No pericardial effusion. Thoracic aorta is normal course and caliber, mild calcific atherosclerosis. MEDIASTINUM/NODES: No lymphadenopathy by CT size criteria. LUNGS/PLEURA: Tracheobronchial tree is patent, no pneumothorax. Mild bronchial wall thickening. Minimal LEFT apical pleuroparenchymal scarring. Trace RIGHT apical bullous changes. No pleural effusions, focal consolidations, pulmonary nodules or masses. UPPER ABDOMEN: Nonacute. Status post cholecystectomy. Lobulated contour LEFT lobe of the liver. MUSCULOSKELETAL: Nonacute. Bridging ventral osteophytes. Scattered Schmorl's nodes and osteopenia. Review of the MIP images confirms the  above findings. IMPRESSION: 1. No acute pulmonary embolism. 2. Mild bronchitic changes seen with bronchitis or reactive airway disease. No pneumonia. 3. Possible cirrhosis, incompletely imaged. Aortic Atherosclerosis (ICD10-I70.0). Electronically Signed   By: Awilda Metro M.D.   On: 07/18/2017 16:18   Ct Cervical Spine Wo Contrast  Result Date: 07/18/2017 CLINICAL DATA:  67 year old male with neck and bilateral arm pain  for 1 month. Shortness of breath on exertion. A fib. SVT. EXAM: CT HEAD WITHOUT CONTRAST CT CERVICAL SPINE WITHOUT CONTRAST TECHNIQUE: Multidetector CT imaging of the head and cervical spine was performed following the standard protocol without intravenous contrast. Multiplanar CT image reconstructions of the cervical spine were also generated. COMPARISON:  Doppler ultrasound 07/18/2017. FINDINGS: CT HEAD FINDINGS Brain: Cerebral volume is within normal limits for age. No midline shift, ventriculomegaly, mass effect, evidence of mass lesion, intracranial hemorrhage or evidence of cortically based acute infarction. Gray-white matter differentiation is within normal limits for age. No cortical encephalomalacia identified. Vascular: Mild Calcified atherosclerosis at the skull base. Dominant distal right vertebral artery. No suspicious intracranial vascular hyperdensity. Skull: No acute osseous abnormality identified. Sinuses/Orbits: Scattered mild to moderate bilateral paranasal sinus mucosal thickening. Small mucous retention cyst in the right maxillary sinus. The tympanic cavities and mastoids are clear. Small volume retained secretions in the nasopharynx. Polypoid posterior nasal cavity opacity (series 7, image 1). Other: Visualized orbit soft tissues are within normal limits. No acute scalp soft tissue finding identified. Suspected right posterior convexity scalp soft tissue scarring on series 7, image 45. Underlying calvarium intact. CT CERVICAL SPINE FINDINGS Alignment: Straightening of cervical lordosis. Cervicothoracic junction alignment is within normal limits. Bilateral posterior element alignment is within normal limits. Skull base and vertebrae: Visualized skull base is intact. No atlanto-occipital dissociation. No cervical spine fracture identified. Soft tissues and spinal canal: No prevertebral fluid or swelling. No visible canal hematoma. Negative noncontrast paraspinal soft tissues. Disc levels: Bulky  osteophytosis including about the anterior C1-odontoid, and also along the anterior vertebral endplates C4-C5 through C7-T1. Associated C6-C7 and possibly also C7-T1 interbody ankylosis related to the bridging osteophytes. Relatively preserved disc spaces, but superimposed circumferential endplate spurring. Multilevel mild facet hypertrophy. Overall there is up to mild degenerative lower cervical spinal stenosis, maximal at C5-C6 and C6-C7. Upper chest: The visible upper thoracic levels appear intact. Negative lung apices. Negative noncontrast thoracic inlet. IMPRESSION: 1. No acute traumatic injury identified in the head or cervical spine. 2.  Normal for age non contrast CT appearance of the brain. 3. Cervical spine Diffuse idiopathic skeletal hyperostosis (DISH). Bulky flowing anterior endplate osteophytes. Subsequent C6-C7 and C7-T1 interbody ankylosis. Mild multifactorial lower cervical spinal stenosis suspected. 4. Possible Sinonasal Polyposis. Mild-to-moderate sinonasal inflammation. Electronically Signed   By: Odessa Fleming M.D.   On: 07/18/2017 16:22   US Carotid Duplex Bilateral  Result Date: 07/18/2017 CLINICAL DATA:  67 year old male with a history of dizziness. Cardiovascular risk factors include hypertension, tobacco use EXAM: BILATERAL CAROTID DUPLEX ULTRASOUND TECHNIQUE: Wallace Cullens scale imaging, color Doppler and duplex ultrasound were performed of bilateral carotid and vertebral arteries in the neck. COMPARISON:  None. FINDINGS: Criteria: Quantification of carotid stenosis is based on velocity parameters that correlate the residual internal carotid diameter with NASCET-based stenosis levels, using the diameter of the distal internal carotid lumen as the denominator for stenosis measurement. The following velocity measurements were obtained: RIGHT ICA:  Systolic 84 cm/sec, Diastolic 23 cm/sec CCA:  119 cm/sec SYSTOLIC ICA/CCA RATIO:  0.7 ECA:  107 cm/sec LEFT ICA:  Systolic 88  cm/sec, Diastolic 26 cm/sec  CCA:  120 cm/sec SYSTOLIC ICA/CCA RATIO:  0.7 ECA:  124 cm/sec Right Brachial SBP: Not acquired Left Brachial SBP: Not acquired RIGHT CAROTID ARTERY: No significant calcified disease of the right common carotid artery. Intermediate waveform maintained. Homogeneous plaque without significant calcifications at the right carotid bifurcation. Low resistance waveform of the right ICA. No significant tortuosity. RIGHT VERTEBRAL ARTERY: Antegrade flow with low resistance waveform. LEFT CAROTID ARTERY: No significant calcified disease of the left common carotid artery. Intermediate waveform maintained. Homogeneous plaque at the left carotid bifurcation without significant calcifications. Low resistance waveform of the left ICA. LEFT VERTEBRAL ARTERY:  Antegrade flow with low resistance waveform. IMPRESSION: Color duplex indicates minimal homogeneous plaque, with no hemodynamically significant stenosis by duplex criteria in the extracranial cerebrovascular circulation. Signed, Yvone NeuJaime S. Reyne DumasWagner, DO, RPVI [facility] Electronically Signed   By: Gilmer MorJaime  Wagner D.O.   On: 07/18/2017 15:26    Procedures Procedures (including critical care time)  Medications Ordered in ED Medications  iopamidol (ISOVUE-370) 76 % injection 75 mL (75 mLs Intravenous Contrast Given 07/18/17 1548)     Initial Impression / Assessment and Plan / ED Course  I have reviewed the triage vital signs and the nursing notes.  Pertinent labs & imaging results that were available during my care of the patient were reviewed by me and considered in my medical decision making (see chart for details).    Extensive work-up here for the complaint of decreased energy increased shortness of exertional pain that is right upper chest her left upper chest radiates towards the neck and shoulders.  This been ongoing for a month.  Without any significant findings.  CT Angie of chest negative for  pulmonary embolus.  Troponin negative would expected to be positive since he has had persistent pain for several days now.  Also patient was concerned about his carotid arteries a duplex scan was done no significant findings there.  CT had also negative.  This patient did complain of some dizziness not true vertigo.  Recommend follow-up with cardiology possibly for an exercise stress test.  Follow-up with primary care doctor may be for MRI of neck.  CT of neck does show some ankylosing changes.  Patient without any upper extremity numbness or weakness to the hands.   Final Clinical Impressions(s) / ED Diagnoses   Final diagnoses:  Precordial pain  Neck pain  SOB (shortness of breath)    ED Discharge Orders    None       Vanetta MuldersZackowski, Yaviel Kloster, MD 07/18/17 [phone]

## 2017-07-18 NOTE — ED Triage Notes (Signed)
Pt c/o of bilateral arm pain that radiates to neck with sob during exertion x1 month.

## 2017-07-18 NOTE — Discharge Instructions (Addendum)
Return for any new or worse symptoms.  Make an appointment to follow-up with cardiology.  They may want to do a non-exercise stress test.  Also recommend following up with your regular doctor would consider MRI of the neck.  Otherwise extensive work-up here without any acute findings.

## 2017-07-26 DIAGNOSIS — I10 Essential (primary) hypertension: Secondary | ICD-10-CM | POA: Diagnosis not present

## 2017-07-26 DIAGNOSIS — E1165 Type 2 diabetes mellitus with hyperglycemia: Secondary | ICD-10-CM | POA: Diagnosis not present

## 2017-07-26 DIAGNOSIS — Z299 Encounter for prophylactic measures, unspecified: Secondary | ICD-10-CM | POA: Diagnosis not present

## 2017-07-26 DIAGNOSIS — Z6841 Body Mass Index (BMI) 40.0 and over, adult: Secondary | ICD-10-CM | POA: Diagnosis not present

## 2017-07-26 DIAGNOSIS — N401 Enlarged prostate with lower urinary tract symptoms: Secondary | ICD-10-CM | POA: Diagnosis not present

## 2017-07-26 DIAGNOSIS — E7800 Pure hypercholesterolemia, unspecified: Secondary | ICD-10-CM | POA: Diagnosis not present

## 2017-07-26 DIAGNOSIS — R350 Frequency of micturition: Secondary | ICD-10-CM | POA: Diagnosis not present

## 2017-07-26 DIAGNOSIS — R338 Other retention of urine: Secondary | ICD-10-CM | POA: Diagnosis not present

## 2017-08-01 ENCOUNTER — Telehealth: Payer: Self-pay | Admitting: *Deleted

## 2017-08-01 NOTE — Telephone Encounter (Signed)
   [facility] Pre-operative Risk Assessment    Request for surgical clearance:  1. What type of surgery is being performed? Urolift  2. When is this surgery scheduled? 08/13/2017  3. What type of clearance is required (medical clearance vs. Pharmacy clearance to hold med vs. Both)?  Medical 4.  5. Are there any medications that need to be held prior to surgery and how long? Not requested  6. Practice name and name of physician performing surgery? [facility] - Dr Nicolette Bang 7.  8. What is your office phone number [phone]   7.   What is your office fax number  [phone]  8.   Anesthesia type (None, local, MAC, general) ? undetermined   Pam 08/01/2017, 5:01 PM  _________________________________________________________________   (provider comments below)

## 2017-08-01 NOTE — Patient Instructions (Signed)
Your procedure is scheduled on: 08/13/2017  Report to [facility] at 6:30    AM.  Call this number if you have problems the morning of surgery: [phone]   Remember:   Do not drink or eat food:After Midnight.  :  Take these medicines the morning of surgery with A SIP OF WATER:Levothyroxine, losartan, Metoprolol, and Paxil    Do not wear jewelry, make-up or nail polish.  Do not wear lotions, powders, or perfumes. You may wear deodorant.  Do not shave 48 hours prior to surgery. Men may shave face and neck.  Do not bring valuables to the hospital.  Contacts, dentures or bridgework may not be worn into surgery.  Leave suitcase in the car. After surgery it may be brought to your room.  For patients admitted to the hospital, checkout time is 11:00 AM the day of discharge.   Patients discharged the day of surgery will not be allowed to drive home.    Special Instructions: Shower using CHG night before surgery and shower the day of surgery use CHG.  Use special wash - you have one bottle of CHG for all showers.  You should use approximately 1/2 of the bottle for each shower.  Cystoscopy, Care After Refer to this sheet in the next few weeks. These instructions provide you with information about caring for yourself after your procedure. Your health care provider may also give you more specific instructions. Your treatment has been planned according to current medical practices, but problems sometimes occur. Call your health care provider if you have any problems or questions after your procedure. What can I expect after the procedure? After the procedure, it is common to have:  Mild pain when you urinate. Pain should stop within a few minutes after you urinate. This may last for up to 1 week.  A small amount of blood in your urine for several days.  Feeling like you need to urinate but producing only a small amount of urine.  Follow these instructions at home:  Medicines  Take  over-the-counter and prescription medicines only as told by your health care provider.  If you were prescribed an antibiotic medicine, take it as told by your health care provider. Do not stop taking the antibiotic even if you start to feel better. General instructions   Return to your normal activities as told by your health care provider. Ask your health care provider what activities are safe for you.  Do not drive for 24 hours if you received a sedative.  Watch for any blood in your urine. If the amount of blood in your urine increases, call your health care provider.  Follow instructions from your health care provider about eating or drinking restrictions.  If a tissue sample was removed for testing (biopsy) during your procedure, it is your responsibility to get your test results. Ask your health care provider or the department performing the test when your results will be ready.  Drink enough fluid to keep your urine clear or pale yellow.  Keep all follow-up visits as told by your health care provider. This is important. Contact a health care provider if:  You have pain that gets worse or does not get better with medicine, especially pain when you urinate.  You have difficulty urinating. Get help right away if:  You have more blood in your urine.  You have blood clots in your urine.  You have abdominal pain.  You have a fever or chills.  You  are unable to urinate. This information is not intended to replace advice given to you by your health care provider. Make sure you discuss any questions you have with your health care provider. Document Released: 08/05/2004 Document Revised: 06/24/2015 Document Reviewed: 12/03/2014 Elsevier Interactive Patient Education  2018 [employer] General Anesthesia, Adult, Care After These instructions provide you with information about caring for yourself after your procedure. Your health care provider may also give you more specific  instructions. Your treatment has been planned according to current medical practices, but problems sometimes occur. Call your health care provider if you have any problems or questions after your procedure. What can I expect after the procedure? After the procedure, it is common to have:  Vomiting.  A sore throat.  Mental slowness.  It is common to feel:  Nauseous.  Cold or shivery.  Sleepy.  Tired.  Sore or achy, even in parts of your body where you did not have surgery.  Follow these instructions at home: For at least 24 hours after the procedure:  Do not: ? Participate in activities where you could fall or become injured. ? Drive. ? Use heavy machinery. ? Drink alcohol. ? Take sleeping pills or medicines that cause drowsiness. ? Make important decisions or sign legal documents. ? Take care of children on your own.  Rest. Eating and drinking  If you vomit, drink water, juice, or soup when you can drink without vomiting.  Drink enough fluid to keep your urine clear or pale yellow.  Make sure you have little or no nausea before eating solid foods.  Follow the diet recommended by your health care provider. General instructions  Have a responsible adult stay with you until you are awake and alert.  Return to your normal activities as told by your health care provider. Ask your health care provider what activities are safe for you.  Take over-the-counter and prescription medicines only as told by your health care provider.  If you smoke, do not smoke without supervision.  Keep all follow-up visits as told by your health care provider. This is important. Contact a health care provider if:  You continue to have nausea or vomiting at home, and medicines are not helpful.  You cannot drink fluids or start eating again.  You cannot urinate after 8-12 hours.  You develop a skin rash.  You have fever.  You have increasing redness at the site of your  procedure. Get help right away if:  You have difficulty breathing.  You have chest pain.  You have unexpected bleeding.  You feel that you are having a life-threatening or urgent problem. This information is not intended to replace advice given to you by your health care provider. Make sure you discuss any questions you have with your health care provider. Document Released: 04/24/2000 Document Revised: 06/21/2015 Document Reviewed: 12/31/2014 Elsevier Interactive Patient Education  [employer]2018 [employer]vier Inc.

## 2017-08-01 NOTE — Progress Notes (Signed)
Dr Dimas MillinMckenzie's nurse notified of need for Cardiac clearance prior to surgery

## 2017-08-03 NOTE — Telephone Encounter (Signed)
   Primary Cardiologist:Dr Ladona Ridgelaylor  Chart reviewed as part of pre-operative protocol coverage. Because of Rolly SalterKenneth J Freeburg's past medical history and time since last visit, he/she will require a follow-up visit in order to better assess preoperative cardiovascular risk.  Pre-op covering staff: - Please schedule appointment and call patient to inform them. - Please contact requesting surgeon's office via preferred method (i.e, phone, fax) to inform them of need for appointment prior to surgery.  Corine ShelterLuke Monika Chestang, PA-C  08/03/2017, 10:56 AM

## 2017-08-03 NOTE — Telephone Encounter (Signed)
Left a message for the patient to call back. Patient will need an appointment.

## 2017-08-06 NOTE — Telephone Encounter (Signed)
Dr. Ladona Ridgelaylor Justin [name] has refused to come in for appt. According to our guidelines - he was seen in ER 07/18/17 for upper chest and arm pain.  Neg CT angio of chest and neg troponins. Thought was cervical disc disease.   Justin [name] stated today he is fine.  Since you do know him would you feel comfortable clearing him for surgery?   Thank you  Nada BoozerLaura Mazie Fencl

## 2017-08-06 NOTE — Telephone Encounter (Signed)
Follow Up:; ° ° °Returning your call. °

## 2017-08-06 NOTE — Telephone Encounter (Signed)
Spoke with pt to schedule a pre-op clearance appointment. Pt states that he does not not need to come in for an appointment. He also states that he has been checked everywhere else and his heart is okay. Pt was asked again if he could come in for an appointment and he refused.

## 2017-08-06 NOTE — Telephone Encounter (Signed)
Please schedule appt

## 2017-08-06 NOTE — Telephone Encounter (Signed)
Left message for patient to call back. Pt needs an appointment.

## 2017-08-07 NOTE — Telephone Encounter (Signed)
Pt has a scheduled appointment with Joni ReiningKathryn Lawrence on 08/09/17 at 8 am.

## 2017-08-07 NOTE — Telephone Encounter (Signed)
Pt has a scheduled appointment for 08/09/17 at 8 am.

## 2017-08-07 NOTE — Telephone Encounter (Signed)
   Primary Cardiologist: Lewayne BuntingGregg Taylor, MD  Chart reviewed as part of pre-operative protocol coverage. Given past medical history OF svt, paf AND HYPERTENSION  and time since last visit, based on ACC/AHA guidelines, Justin [name] would be at acceptable risk / low risk for the planned procedure -per Dr. Ladona Ridgelaylor, BUT ALSO HARD TO KNOW WITHOUT FACE TO FACE VISIT.  We had requested pt be seen but pt stated he had no issues and would not come in.      I will route this recommendation to the requesting party via Epic fax function and remove from pre-op pool.  Please call with questions.  Nada BoozerLaura Glorimar Stroope, NP 08/07/2017, 2:02 PM

## 2017-08-07 NOTE — Progress Notes (Signed)
Spoke with Nada BoozerLaura Ingold regarding surgical clearance.  Per her note and conversation today, patient will need to be seen by Dr. Ladona Ridgelaylor prior to proceeding with upcomming urology procedure.  Patient notified of cancellation of PAT for tomorrow and advised of above and that the office would be calling with an appointment.  Dr Dimas MillinMcKenzie's nurse notified and will relay the information to him.

## 2017-08-07 NOTE — Telephone Encounter (Signed)
Per Dr. Ladona Ridgelaylor- "Probably low risk but hard to know without face-face visit".

## 2017-08-08 ENCOUNTER — Encounter (HOSPITAL_COMMUNITY)
Admission: RE | Admit: 2017-08-08 | Discharge: 2017-08-08 | Disposition: A | Discharge status: Home / Self Care | Payer: Medicare HMO | Source: Ambulatory Visit | Attending: Urology | Admitting: Urology

## 2017-08-08 ENCOUNTER — Encounter (HOSPITAL_COMMUNITY): Payer: Self-pay

## 2017-08-08 NOTE — Progress Notes (Signed)
Cardiology Office Note   Date:  08/09/2017   ID:  Justin [name], Justin [name] 05-28-1950, MRN [id number]  PCP:  Kirstie Peri, MD  Cardiologist: Dr.Taylor   Chief Complaint  Patient presents with  . Pre-op Exam     History of Present Illness: Justin [name] is a 67 y.o. male who presents for ongoing assessment and management of PAF, hx of PSVT, and HTN. He was last seen in the office on 08/07/2017 at which time he had increased metoprolol to 12.5 mg BID on his own with improvement of symptoms concerning palpitations and dizziness. I added potassium 10 mEq daily to his regimen due to leg cramps.  He is here for pre-operative cardiac evaluation to undergo urolift per [facility], Dr. Ronne Binning, on August 13, 2017.  He is without cardiac complaints today. Denies chest pain. Rapid HR, DOE, or fatigue. He is medically complaint.   Past Medical History:  Diagnosis Date  . AF (atrial fibrillation) (HCC)   . Anxiety and depression   . Hypertension   . Hypothyroidism   . Obesity   . Palpitations   . SVT (supraventricular tachycardia) (HCC)     Past Surgical History:  Procedure Laterality Date  . CARDIOVASCULAR STRESS TEST  09/05/2007  . CHOLECYSTECTOMY       Current Outpatient Medications  Medication Sig Dispense Refill  . ALPRAZolam (XANAX) 1 MG tablet Take 1 mg by mouth 3 (three) times daily.     Marland Kitchen aspirin EC 81 MG tablet Take 81 mg by mouth daily.    . finasteride (PROSCAR) 5 MG tablet Take 5 mg by mouth daily.    . furosemide (LASIX) 20 MG tablet TAKE ONE TABLET BY MOUTH EVERY DAY AS NEEDED 90 tablet 1  . levothyroxine (SYNTHROID, LEVOTHROID) 125 MCG tablet Take 125 mcg by mouth daily.  7  . losartan (COZAAR) 25 MG tablet Take 25 mg by mouth daily.     . metoprolol succinate (TOPROL-XL) 25 MG 24 hr tablet TAKE 1/2 TABLET BY MOUTH TWICE DAILY 90 tablet 1  . nitroGLYCERIN (NITROSTAT) 0.4 MG SL tablet Place 1 tablet (0.4 mg total) under the tongue every 5 (five) minutes as needed for  chest pain. 25 tablet 3  . Omega-3 Krill Oil 1000 MG CAPS Take 1,000 mg by mouth daily.    Marland Kitchen PARoxetine (PAXIL) 20 MG tablet Take 20 mg by mouth daily.     . potassium chloride (K-DUR,KLOR-CON) 10 MEQ tablet TAKE ONE TABLET BY MOUTH EVERY DAY 90 tablet 1  . tamsulosin (FLOMAX) 0.4 MG CAPS capsule Take 0.4 mg by mouth daily.      No current facility-administered medications for this visit.     Allergies:   Haloperidol lactate    Social History:  The patient  reports that he has quit smoking. His smokeless tobacco use includes snuff. He reports that he does not drink alcohol or use drugs.   Family History:  The patient's family history includes Heart disease in his father and mother.    ROS: All other systems are reviewed and negative. Unless otherwise mentioned in H&P    PHYSICAL EXAM: VS:  BP 132/76 (BP Location: Left Arm)   Pulse (!) 59   Ht 5\' 10"  (1.778 m)   Wt 274 lb 12.8 oz (124.6 kg)   BMI 39.43 kg/m  , BMI Body mass index is 39.43 kg/m. GEN: Well nourished, well developed, in no acute distress Obese  HEENT: normal  Neck: no JVD, carotid bruits, or masses  Cardiac:RRR; no murmurs, rubs, or gallops,no edema  Respiratory: Clear to auscultation bilaterally, normal work of breathing GI: soft, nontender, nondistended, + BS MS: no deformity or atrophy  Skin: warm and dry, no rash Neuro:  Strength and sensation are intact Psych: euthymic mood, full affect   EKG:  NSR rate of 59 bpm, possible inferior Q-waves.   Recent Labs: 07/18/2017: B Natriuretic Peptide 25.0; BUN 14; Creatinine, Ser 1.04; Hemoglobin 14.0; Platelets 136; Potassium 3.5; Sodium 138    Lipid Panel No results found for: CHOL, TRIG, HDL, CHOLHDL, VLDL, LDLCALC, LDLDIRECT    Wt Readings from Last 3 Encounters:  08/09/17 274 lb 12.8 oz (124.6 kg)  07/18/17 270 lb (122.5 kg)  11/20/16 270 lb (122.5 kg)      Other studies Reviewed: Stress Echo 02/28/2017 Stress results:  Maximal heart rate during  stress was 134bpm (85% of maximal predicted heart rate). The maximal predicted heart rate was 157bpm.The target heart rate was achieved. The heart rate response to stress was normal. There was a normal resting blood pressure. Normal blood pressure response to dobutamine. The rate-pressure product for the peak heart rate and blood pressure was 21147mm Hg/min. The patient experienced no chest pain during stress.  ------------------------------------------------------------ Stress ECG:  The stress ECG was negative for ischemia.  ------------------------------------------------------------ Baseline:  - The estimated LV ejection fraction was 60%. - Normal wall motion; no LV regional wall motion abnormalities. Low dose:  - The estimated LV ejection fraction was 70%. - Normal wall motion; no LV regional wall motion abnormalities. Peak stress:  - The estimated LV ejection fraction was 75%. - Normal wall motion; no LV regional wall motion abnormalities. Recovery:  - The estimated LV ejection fraction was 70%. - Normal wall motion; no LV regional wall motion abnormalities.  ASSESSMENT AND PLAN:  1. Pre-Operative Cardiac Clearance:  He is evaluated, chart reviewed and physically examined.    Chart reviewed as part of pre-operative protocol coverage. Given past medical history and time since last visit, based on ACC/AHA guidelines, Justin [name] would be at acceptable risk for the planned procedure without further cardiovascular testing.   2. PAF: Currently in NSR, not on anticoagulation at this time. No complaints of chest pain or palpitations, racing HR or dyspnea. Continue current regimen with metoprolol 12.5 mg BID.   3.  Hypertension: BP is well controlled. Continue lasix prn.   Current medicines are reviewed at length with the patient today.    Labs/ tests ordered today include: None   Bettey MareKathryn M. Liborio NixonLawrence DNP, ANP, AACC   08/09/2017 8:22 AM    [facility] [address], [place], [place] [address] Phone: [phone]; Fax: [phone]

## 2017-08-08 NOTE — Telephone Encounter (Signed)
Spoke with pt and made aware of appointment tomorrow with Joni ReiningKathryn Lawrence at 8:00 am. Pt verbalized understanding.

## 2017-08-08 NOTE — Progress Notes (Signed)
Patient was made and appointment with Bailey MechKatherine Lawrence, NP at [facility] in [place] at 2:50 yesterday for 8:00 tomorrow.  Dr Ronne BinningMckenzie made aware of appointment and that I would update him wiht findings.  Patient contacted and given the same information.  Address, appointment time, and phone number provided to patient.  Advised him that it may be possible for him to have his surgery on Monday, provided clearance was given and no further testing was needed.  Verbalized understanding and stated that he would be glad to go in the morning.

## 2017-08-09 ENCOUNTER — Encounter: Payer: Self-pay | Admitting: Adult Health

## 2017-08-09 ENCOUNTER — Ambulatory Visit: Payer: Medicare HMO | Admitting: Adult Health

## 2017-08-09 ENCOUNTER — Encounter (HOSPITAL_COMMUNITY): Payer: Self-pay | Admitting: *Deleted

## 2017-08-09 VITALS — BP 132/76 | HR 59 | Ht 70.0 in | Wt 274.8 lb

## 2017-08-09 DIAGNOSIS — I10 Essential (primary) hypertension: Secondary | ICD-10-CM

## 2017-08-09 DIAGNOSIS — Z01811 Encounter for preprocedural respiratory examination: Secondary | ICD-10-CM | POA: Diagnosis not present

## 2017-08-09 DIAGNOSIS — I480 Paroxysmal atrial fibrillation: Secondary | ICD-10-CM

## 2017-08-09 NOTE — Patient Instructions (Signed)
Medication Instructions:  NO CHANGES- Your physician recommends that you continue on your current medications as directed. Please refer to the Current Medication list given to you today.  If you need a refill on your cardiac medications before your next appointment, please call your pharmacy.  Special Instructions: CLEARED FOR PROSTATE SURGERY WITH Dr Ronne BinningMcKenzie  Follow-Up: Your physician wants you to follow-up in: 12 MONTHS WITH B STRADER -OR- DR Ladona RidgelAYLOR IN [place]. You should receive a reminder letter in the mail two months in advance. If you do not receive a letter, please call our office MAY 2020 to schedule the July 2020 follow-up appointment.   Thank you for choosing [facility]

## 2017-08-13 ENCOUNTER — Other Ambulatory Visit: Payer: Self-pay

## 2017-08-13 ENCOUNTER — Ambulatory Visit (HOSPITAL_COMMUNITY)
Admission: RE | Admit: 2017-08-13 | Discharge: 2017-08-13 | Disposition: A | Discharge status: Home / Self Care | Payer: Medicare HMO | Source: Ambulatory Visit | Attending: Urology | Admitting: Urology

## 2017-08-13 ENCOUNTER — Ambulatory Visit (HOSPITAL_COMMUNITY): Payer: Medicare HMO | Admitting: Anesthesiology

## 2017-08-13 ENCOUNTER — Encounter (HOSPITAL_COMMUNITY)
Admission: RE | Disposition: A | Discharge status: Home / Self Care | Payer: Self-pay | Source: Ambulatory Visit | Attending: Urology

## 2017-08-13 ENCOUNTER — Encounter (HOSPITAL_COMMUNITY): Payer: Self-pay | Admitting: *Deleted

## 2017-08-13 DIAGNOSIS — R3915 Urgency of urination: Secondary | ICD-10-CM | POA: Insufficient documentation

## 2017-08-13 DIAGNOSIS — N138 Other obstructive and reflux uropathy: Secondary | ICD-10-CM | POA: Diagnosis not present

## 2017-08-13 DIAGNOSIS — I10 Essential (primary) hypertension: Secondary | ICD-10-CM | POA: Insufficient documentation

## 2017-08-13 DIAGNOSIS — N320 Bladder-neck obstruction: Secondary | ICD-10-CM | POA: Diagnosis not present

## 2017-08-13 DIAGNOSIS — E039 Hypothyroidism, unspecified: Secondary | ICD-10-CM | POA: Diagnosis not present

## 2017-08-13 DIAGNOSIS — F329 Major depressive disorder, single episode, unspecified: Secondary | ICD-10-CM | POA: Diagnosis not present

## 2017-08-13 DIAGNOSIS — I4891 Unspecified atrial fibrillation: Secondary | ICD-10-CM | POA: Diagnosis not present

## 2017-08-13 DIAGNOSIS — Z79899 Other long term (current) drug therapy: Secondary | ICD-10-CM | POA: Diagnosis not present

## 2017-08-13 DIAGNOSIS — Z87891 Personal history of nicotine dependence: Secondary | ICD-10-CM | POA: Diagnosis not present

## 2017-08-13 DIAGNOSIS — F419 Anxiety disorder, unspecified: Secondary | ICD-10-CM | POA: Diagnosis not present

## 2017-08-13 DIAGNOSIS — I471 Supraventricular tachycardia: Secondary | ICD-10-CM | POA: Diagnosis not present

## 2017-08-13 DIAGNOSIS — N401 Enlarged prostate with lower urinary tract symptoms: Secondary | ICD-10-CM | POA: Diagnosis not present

## 2017-08-13 HISTORY — PX: CYSTOSCOPY WITH INSERTION OF UROLIFT: SHX6678

## 2017-08-13 SURGERY — CYSTOSCOPY WITH INSERTION OF UROLIFT
Anesthesia: Monitor Anesthesia Care

## 2017-08-13 MED ORDER — FENTANYL CITRATE (PF) 100 MCG/2ML IJ SOLN
INTRAMUSCULAR | Status: DC | PRN
  Administered 2017-08-13 (×4): 25 ug via INTRAVENOUS

## 2017-08-13 MED ORDER — MIDAZOLAM HCL 5 MG/5ML IJ SOLN
INTRAMUSCULAR | Status: DC | PRN
  Administered 2017-08-13 (×2): 1 mg via INTRAVENOUS

## 2017-08-13 MED ORDER — PROPOFOL 10 MG/ML IV BOLUS
INTRAVENOUS | Status: DC | PRN
  Administered 2017-08-13 (×3): 17 mg via INTRAVENOUS

## 2017-08-13 MED ORDER — HYDROCODONE-ACETAMINOPHEN 7.5-325 MG PO TABS
1.0000 | ORAL_TABLET | Freq: Once | ORAL | Status: AC | PRN
  Administered 2017-08-13: 1 via ORAL
  Filled 2017-08-13: qty 1

## 2017-08-13 MED ORDER — LIDOCAINE HCL URETHRAL/MUCOSAL 2 % EX GEL
CUTANEOUS | Status: AC
  Filled 2017-08-13: qty 10

## 2017-08-13 MED ORDER — STERILE WATER FOR IRRIGATION IR SOLN
Status: DC | PRN
  Administered 2017-08-13: 3000 mL
  Administered 2017-08-13: 500 mL

## 2017-08-13 MED ORDER — LIDOCAINE HCL URETHRAL/MUCOSAL 2 % EX GEL
CUTANEOUS | Status: DC | PRN
  Administered 2017-08-13: 1 via URETHRAL

## 2017-08-13 MED ORDER — FENTANYL CITRATE (PF) 100 MCG/2ML IJ SOLN
25.0000 ug | INTRAMUSCULAR | Status: DC | PRN
  Administered 2017-08-13 (×2): 50 ug via INTRAVENOUS
  Filled 2017-08-13: qty 2

## 2017-08-13 MED ORDER — LIDOCAINE HCL (PF) 1 % IJ SOLN
INTRAMUSCULAR | Status: AC
  Filled 2017-08-13: qty 5

## 2017-08-13 MED ORDER — OXYCODONE-ACETAMINOPHEN 5-325 MG PO TABS: 1.0000 | ORAL_TABLET | ORAL | 0 refills | Status: DC | PRN

## 2017-08-13 MED ORDER — MIDAZOLAM HCL 2 MG/2ML IJ SOLN
INTRAMUSCULAR | Status: AC
  Filled 2017-08-13: qty 2

## 2017-08-13 MED ORDER — PROPOFOL 500 MG/50ML IV EMUL
INTRAVENOUS | Status: DC | PRN
  Administered 2017-08-13: 100 ug/kg/min via INTRAVENOUS

## 2017-08-13 MED ORDER — LACTATED RINGERS IV SOLN
INTRAVENOUS | Status: DC
  Administered 2017-08-13 (×2): via INTRAVENOUS

## 2017-08-13 MED ORDER — PROPOFOL 10 MG/ML IV BOLUS
INTRAVENOUS | Status: AC
  Filled 2017-08-13: qty 20

## 2017-08-13 MED ORDER — FENTANYL CITRATE (PF) 100 MCG/2ML IJ SOLN
INTRAMUSCULAR | Status: AC
  Filled 2017-08-13: qty 2

## 2017-08-13 MED ORDER — DEXTROSE 5 % IV SOLN
3.0000 g | INTRAVENOUS | Status: DC
  Filled 2017-08-13: qty 3000

## 2017-08-13 MED ORDER — CEFAZOLIN SODIUM-DEXTROSE 2-4 GM/100ML-% IV SOLN: 2.0000 g | INTRAVENOUS | Status: DC

## 2017-08-13 SURGICAL SUPPLY — 16 items
BAG DRAIN URO TABLE W/ADPT NS (BAG) ×3 IMPLANT
CLOTH BEACON ORANGE TIMEOUT ST (SAFETY) ×3 IMPLANT
GLOVE BIO SURGEON STRL SZ8 (GLOVE) ×3 IMPLANT
GLOVE BIOGEL PI IND STRL 7.0 (GLOVE) ×2 IMPLANT
GLOVE BIOGEL PI INDICATOR 7.0 (GLOVE) ×4
GLOVE ECLIPSE 6.5 STRL STRAW (GLOVE) ×3 IMPLANT
GOWN STRL REUS W/TWL LRG LVL3 (GOWN DISPOSABLE) ×3 IMPLANT
GOWN STRL REUS W/TWL XL LVL3 (GOWN DISPOSABLE) ×3 IMPLANT
KIT TURNOVER CYSTO (KITS) ×3 IMPLANT
MANIFOLD NEPTUNE II (INSTRUMENTS) ×3 IMPLANT
PACK CYSTO (CUSTOM PROCEDURE TRAY) ×3 IMPLANT
PAD ARMBOARD 7.5X6 YLW CONV (MISCELLANEOUS) ×3 IMPLANT
SYSTEM UROLIFT (Male Continence) ×12 IMPLANT
TRAY FOLEY W/BAG SLVR 16FR (SET/KITS/TRAYS/PACK) ×2
TRAY FOLEY W/BAG SLVR 16FR ST (SET/KITS/TRAYS/PACK) ×1 IMPLANT
WATER STERILE IRR 3000ML UROMA (IV SOLUTION) ×3 IMPLANT

## 2017-08-13 NOTE — Transfer of Care (Signed)
Immediate Anesthesia Transfer of Care Note  Patient: Justin [name]  Procedure(s) Performed: CYSTOSCOPY WITH INSERTION OF UROLIFT (N/A )  Patient Location: PACU  Anesthesia Type:MAC  Level of Consciousness: awake, alert  and oriented  Airway & Oxygen Therapy: Patient Spontanous Breathing  Post-op Assessment: Report given to RN  Post vital signs: Reviewed and stable  Last Vitals:  Vitals Value Taken Time  BP 131/98 08/13/2017  8:38 AM  Temp    Pulse 57 08/13/2017  8:40 AM  Resp 12 08/13/2017  8:40 AM  SpO2 97 % 08/13/2017  8:40 AM  Vitals shown include unvalidated device data.  Last Pain:  Vitals:   08/13/17 0649  TempSrc: Oral  PainSc:       Patients Stated Pain Goal: 7 (48/25/00 3704)  Complications: No apparent anesthesia complications

## 2017-08-13 NOTE — Anesthesia Postprocedure Evaluation (Signed)
Anesthesia Post Note  Patient: Justin [name]  Procedure(s) Performed: CYSTOSCOPY WITH INSERTION OF UROLIFT (N/A )  Patient location during evaluation: PACU Anesthesia Type: MAC Level of consciousness: awake and alert and oriented Pain management: pain level controlled Vital Signs Assessment: post-procedure vital signs reviewed and stable Respiratory status: spontaneous breathing Cardiovascular status: blood pressure returned to baseline and stable Postop Assessment: no apparent nausea or vomiting Anesthetic complications: no     Last Vitals:  Vitals:   08/13/17 0845 08/13/17 0900  BP: 98/67 99/81  Pulse: (!) 56 61  Resp: 14 18  Temp:    SpO2: 95% 95%    Last Pain:  Vitals:   08/13/17 0900  TempSrc:   PainSc: 5                  [name]

## 2017-08-13 NOTE — Discharge Instructions (Signed)
° °Cystoscopy, Care After °Refer to this sheet in the next few weeks. These instructions provide you with information about caring for yourself after your procedure. Your health care provider may also give you more specific instructions. Your treatment has been planned according to current medical practices, but problems sometimes occur. Call your health care provider if you have any problems or questions after your procedure. °What can I expect after the procedure? °After the procedure, it is common to have: °· Mild pain when you urinate. Pain should stop within a few minutes after you urinate. This may last for up to 1 week. °· A small amount of blood in your urine for several days. °· Feeling like you need to urinate but producing only a small amount of urine. ° °Follow these instructions at home: ° °Medicines °· Take over-the-counter and prescription medicines only as told by your health care provider. °· If you were prescribed an antibiotic medicine, take it as told by your health care provider. Do not stop taking the antibiotic even if you start to feel better. °General instructions ° °· Return to your normal activities as told by your health care provider. Ask your health care provider what activities are safe for you. °· Do not drive for 24 hours if you received a sedative. °· Watch for any blood in your urine. If the amount of blood in your urine increases, call your health care provider. °· Follow instructions from your health care provider about eating or drinking restrictions. °· If a tissue sample was removed for testing (biopsy) during your procedure, it is your responsibility to get your test results. Ask your health care provider or the department performing the test when your results will be ready. °· Drink enough fluid to keep your urine clear or pale yellow. °· Keep all follow-up visits as told by your health care provider. This is important. °Contact a health care provider if: °· You have pain  that gets worse or does not get better with medicine, especially pain when you urinate. °· You have difficulty urinating. °Get help right away if: °· You have more blood in your urine. °· You have blood clots in your urine. °· You have abdominal pain. °· You have a fever or chills. °· You are unable to urinate. °This information is not intended to replace advice given to you by your health care provider. Make sure you discuss any questions you have with your health care provider. °Document Released: 08/05/2004 Document Revised: 06/24/2015 Document Reviewed: 12/03/2014 °Elsevier Interactive Patient Education © 2018 [employer] °Indwelling Urinary Catheter Care, Adult °Take good care of your catheter to keep it working and to prevent problems. °How to wear your catheter °Attach your catheter to your leg with tape (adhesive tape) or a leg strap. Make sure it is not too tight. If you use tape, remove any bits of tape that are already on the catheter. °How to wear a drainage bag °You should have: °· A large overnight bag. °· A small leg bag. ° °Overnight Bag °You may wear the overnight bag at any time. Always keep the bag below the level of your bladder but off the floor. When you sleep, put a clean plastic bag in a wastebasket. Then hang the bag inside the wastebasket. °Leg Bag °Never wear the leg bag at night. Always wear the leg bag below your knee. Keep the leg bag secure with a leg strap or tape. °How to care for your skin °· Clean the   skin around the catheter at least once every day. °· Shower every day. Do not take baths. °· Put creams, lotions, or ointments on your genital area only as told by your doctor. °· Do not use powders, sprays, or lotions on your genital area. °How to clean your catheter and your skin °1. Wash your hands with soap and water. °2. Wet a washcloth in warm water and gentle (mild) soap. °3. Use the washcloth to clean the skin where the catheter enters your body. Clean downward and wipe away  from the catheter in small circles. Do not wipe toward the catheter. °4. Pat the area dry with a clean towel. Make sure to clean off all soap. °How to care for your drainage bags °Empty your drainage bag when it is ?-½ full or at least 2-3 times a day. Replace your drainage bag once a month or sooner if it starts to smell bad or look dirty. Do not clean your drainage bag unless told by your doctor. °Emptying a drainage bag ° °Supplies Needed °· Rubbing alcohol. °· Gauze pad or cotton ball. °· Tape or a leg strap. ° °Steps °1. Wash your hands with soap and water. °2. Separate (detach) the bag from your leg. °3. Hold the bag over the toilet or a clean container. Keep the bag below your hips and bladder. This stops pee (urine) from going back into the tube. °4. Open the pour spout at the bottom of the bag. °5. Empty the pee into the toilet or container. Do not let the pour spout touch any surface. °6. Put rubbing alcohol on a gauze pad or cotton ball. °7. Use the gauze pad or cotton ball to clean the pour spout. °8. Close the pour spout. °9. Attach the bag to your leg with tape or a leg strap. °10. Wash your hands. ° °Changing a drainage bag °Supplies Needed °· Alcohol wipes. °· A clean drainage bag. °· Adhesive tape or a leg strap. ° °Steps °1. Wash your hands with soap and water. °2. Separate the dirty bag from your leg. °3. Pinch the rubber catheter with your fingers so that pee does not spill out. °4. Separate the catheter tube from the drainage tube where these tubes connect (at the connection valve). Do not let the tubes touch any surface. °5. Clean the end of the catheter tube with an alcohol wipe. Use a different alcohol wipe to clean the end of the drainage tube. °6. Connect the catheter tube to the drainage tube of the clean bag. °7. Attach the new bag to the leg with adhesive tape or a leg strap. °8. Wash your hands. ° °How to prevent infection and other problems °· Never pull on your catheter or try to  remove it. Pulling can damage tissue in your body. °· Always wash your hands before and after touching your catheter. °· If a leg strap gets wet, replace it with a dry one. °· Drink enough fluids to keep your pee clear or pale yellow, or as told by your doctor. °· Do not let the drainage bag or tubing touch the floor. °· Wear cotton underwear. °· If you are male, wipe from front to back after you poop (have a bowel movement). °· Check on the catheter often to make sure it works and the tubing is not twisted. °Get help if: °· Your pee is cloudy. °· Your pee smells unusually bad. °· Your pee is not draining into the bag. °· Your tube gets clogged. °·   Your catheter starts to leak. °· Your bladder feels full. °Get help right away if: °· You have redness, swelling, or pain where the catheter enters your body. °· You have fluid, pus, or a bad smell coming from the area where the catheter enters your body. °· The area where the catheter enters your body feels warm. °· You have a fever. °· You have pain in your: °? Stomach (abdomen). °? Legs. °? Lower back. °? Bladder. °· You see blood fill the catheter. °· Your pee is pink or red. °· You feel sick to your stomach (nauseous). °· You throw up (vomit). °· You have chills. °· Your catheter gets pulled out. °This information is not intended to replace advice given to you by your health care provider. Make sure you discuss any questions you have with your health care provider. °Document Released: 05/13/2012 Document Revised: 12/15/2015 Document Reviewed: 07/01/2013 °Elsevier Interactive Patient Education © 2018 [employer]vier [employer]. ° °

## 2017-08-13 NOTE — Op Note (Signed)
   PREOPERATIVE DIAGNOSIS: Benign prostatic hypertrophy with bladder outlet obstruction and elevated PVR.  POSTOPERATIVE DIAGNOSIS:same  PROCEDURE: Cystoscopy with implantation of UroLift devices, 4 implants.  SURGEON: Wilkie AyePatrick Riona Lahti, M.D.  ANESTHESIA: General  ANTIBIOTICS: ancef  SPECIMEN: None.  DRAINS: A 16-French Foley catheter.  BLOOD LOSS: Minimal.  COMPLICATIONS: None.  INDICATIONS:The Patient is an 67 year old white male with BPH and bladder outlet obstruction. He has failed medical therapy and has elected UroLift for definitive treatment.  FINDINGS OF PROCEDURE: He was taken to the operating room where a genral anesthetic was induced. He was placed in lithotomy position and was fitted with PAS hose. His perineum and genitalia were prepped with chlorhexidine, and he was draped in usual sterile fashion.  Cystoscopy was performed using the UroLift scope and 0 degree lens. Examination revealed a normal urethra. The external sphincter was intact. Prostatic urethra was approximately 4 cm in length with lateral lobe enlargement. There was also little bit of bladder neck elevation. Inspection of bladder revealed mild-to-moderate trabeculation with no tumors, stones, or inflammation. No cellules or diverticula were noted. Ureteral orifices were in their normal anatomic position effluxing clear urine.  After initial cystoscopy, the visual obturator was replaced with the first UroLift device. This was turned to the 9 o'clock position and pulled back to the veru and then slightly advanced. Pressure was then applied to the right lateral lobe and the UroLift device was deployed.  The second UroLift device was then inserted and applied to the left lateral lobe at 3 o'clock and deployed in the mid prostatic urethra. After this, there was still some apparent obstruction closer to the bladder neck. So a second level of UroLift your left device was  applied between the mid urethra and the proximal urethra providing further patency to the prostatic urethra. At this point, there was mild bleeding but the patient did have a spinal anesthetic. So it was thought that a Foley catheter was indicated. The scope was removed and a 16-French Foley catheter was inserted without difficulty. The balloon was filled with 10 mL sterile fluid, and the catheter was placed to straight drainage.  COMPLICATIONS: None   CONDITION: Stable, extubated, transferred to PACU  PLAN: The patient will be discharged home and followup in 2 days for a voiding trial.

## 2017-08-13 NOTE — Anesthesia Preprocedure Evaluation (Signed)
Anesthesia Evaluation  Patient identified by MRN, date of birth, ID band Patient awake    Reviewed: Allergy & Precautions, NPO status , Patient's Chart, lab work & pertinent test results  Airway Mallampati: II  TM Distance: >3 FB Neck ROM: Full    Dental no notable dental hx. (+) Edentulous Upper, Edentulous Lower   Pulmonary neg pulmonary ROS, former smoker,    Pulmonary exam normal breath sounds clear to auscultation       Cardiovascular Exercise Tolerance: Good hypertension, Pt. on medications negative cardio ROS Normal cardiovascular examI Rhythm:Regular Rate:Normal  Denies CP- states limited ET secondary pinched nerve   Neuro/Psych PSYCHIATRIC DISORDERS Anxiety Depression negative neurological ROS  negative psych ROS   GI/Hepatic negative GI ROS, Neg liver ROS,   Endo/Other  negative endocrine ROSHypothyroidism   Renal/GU negative Renal ROS  negative genitourinary   Musculoskeletal negative musculoskeletal ROS (+)   Abdominal   Peds negative pediatric ROS (+)  Hematology negative hematology ROS (+)   Anesthesia Other Findings   Reproductive/Obstetrics negative OB ROS                             Anesthesia Physical Anesthesia Plan  ASA: II  Anesthesia Plan: MAC   Post-op Pain Management:    Induction: Intravenous  PONV Risk Score and Plan:   Airway Management Planned: Nasal Cannula  Additional Equipment:   Intra-op Plan:   Post-operative Plan: Extubation in OR  Informed Consent: I have reviewed the patients History and Physical, chart, labs and discussed the procedure including the risks, benefits and alternatives for the proposed anesthesia with the patient or authorized representative who has indicated his/her understanding and acceptance.   Dental advisory given  Plan Discussed with: CRNA  Anesthesia Plan Comments:         Anesthesia Quick Evaluation

## 2017-08-13 NOTE — H&P (Signed)
Urology Admission H&P  Chief Complaint: urinary urgency  History of Present Illness: Mr Justin [name] is a 67yo with a hx of BPH and elevated PVR. He has failed medical therapy. He has severe urgency, frequency and a feeling of incomplete emptying  Past Medical History:  Diagnosis Date  . AF (atrial fibrillation) (HCC)   . Anxiety and depression   . Hypertension   . Hypothyroidism   . Obesity   . Palpitations   . SVT (supraventricular tachycardia) (HCC)    Past Surgical History:  Procedure Laterality Date  . CARDIOVASCULAR STRESS TEST  09/05/2007  . CHOLECYSTECTOMY      Home Medications:  Current Facility-Administered Medications  Medication Dose Route Frequency Provider Last Rate Last Dose  . ceFAZolin (ANCEF) 3 g in dextrose 5 % 50 mL IVPB  3 g Intravenous 30 min Pre-Op Otila Starn, Mardene CelestePatrick L, MD      . lactated ringers infusion   Intravenous Continuous Dorena CookeyNabonsal, Jeff S, MD 50 mL/hr at 08/13/17 0743    . sterile water for irrigation for irrigation    PRN Malen GauzeMcKenzie, Willey Due L, MD   500 mL at 08/13/17 0754   Allergies:  Allergies  Allergen Reactions  . Haloperidol Lactate Other (See Comments)    Broke the hospital bed    Family History  Problem Relation Age of Onset  . Heart disease Mother   . Heart disease Father    Social History:  reports that he has quit smoking. He has quit using smokeless tobacco. His smokeless tobacco use included snuff. He reports that he does not drink alcohol or use drugs.  Review of Systems  Genitourinary: Positive for frequency and urgency.  All other systems reviewed and are negative.   Physical Exam:  Vital signs in last 24 hours: Temp:  [98.5 F (36.9 C)] 98.5 F (36.9 C) (07/15 0649) Pulse Rate:  [58] 58 (07/15 0649) BP: (125)/(69) 125/69 (07/15 0649) SpO2:  [97 %] 97 % (07/15 0649) Physical Exam  Constitutional: He is oriented to person, place, and time. He appears well-developed and well-nourished.  HENT:  Head: Normocephalic and  atraumatic.  Eyes: Pupils are equal, round, and reactive to light. EOM are normal.  Neck: Normal range of motion. No thyromegaly present.  Cardiovascular: Normal rate and regular rhythm.  Respiratory: Effort normal. No respiratory distress.  GI: Soft. He exhibits no distension.  Musculoskeletal: Normal range of motion. He exhibits no edema.  Neurological: He is alert and oriented to person, place, and time.  Skin: Skin is warm and dry.  Psychiatric: He has a normal mood and affect. His behavior is normal. Judgment and thought content normal.    Laboratory Data:  No results found for this or any previous visit (from the past 24 hour(s)). No results found for this or any previous visit (from the past 240 hour(s)). Creatinine: No results for input(s): CREATININE in the last 168 hours. Baseline Creatinine: unknwon  Impression/Assessment:  67yo with BPh with incomplete emptying  Plan:  The risks/benefits/alternatives to urolift was explained to the patient and he understands and wishes to proceed with surgery  Justin [name] 08/13/2017, 7:56 AM

## 2017-08-14 ENCOUNTER — Encounter (HOSPITAL_COMMUNITY): Payer: Self-pay | Admitting: Urology

## 2017-08-14 DIAGNOSIS — I10 Essential (primary) hypertension: Secondary | ICD-10-CM | POA: Diagnosis not present

## 2017-08-14 DIAGNOSIS — E119 Type 2 diabetes mellitus without complications: Secondary | ICD-10-CM | POA: Diagnosis not present

## 2017-08-14 DIAGNOSIS — E7800 Pure hypercholesterolemia, unspecified: Secondary | ICD-10-CM | POA: Diagnosis not present

## 2017-08-15 ENCOUNTER — Ambulatory Visit (INDEPENDENT_AMBULATORY_CARE_PROVIDER_SITE_OTHER): Payer: Medicare HMO | Admitting: Urology

## 2017-08-15 DIAGNOSIS — N401 Enlarged prostate with lower urinary tract symptoms: Secondary | ICD-10-CM

## 2017-08-20 DIAGNOSIS — I10 Essential (primary) hypertension: Secondary | ICD-10-CM | POA: Diagnosis not present

## 2017-08-20 DIAGNOSIS — Z299 Encounter for prophylactic measures, unspecified: Secondary | ICD-10-CM | POA: Diagnosis not present

## 2017-08-20 DIAGNOSIS — R600 Localized edema: Secondary | ICD-10-CM | POA: Diagnosis not present

## 2017-08-20 DIAGNOSIS — E1165 Type 2 diabetes mellitus with hyperglycemia: Secondary | ICD-10-CM | POA: Diagnosis not present

## 2017-08-20 DIAGNOSIS — E1142 Type 2 diabetes mellitus with diabetic polyneuropathy: Secondary | ICD-10-CM | POA: Diagnosis not present

## 2017-08-21 ENCOUNTER — Ambulatory Visit: Payer: Medicare HMO | Admitting: Internal Medicine

## 2017-08-27 DIAGNOSIS — Z299 Encounter for prophylactic measures, unspecified: Secondary | ICD-10-CM | POA: Diagnosis not present

## 2017-08-27 DIAGNOSIS — E1165 Type 2 diabetes mellitus with hyperglycemia: Secondary | ICD-10-CM | POA: Diagnosis not present

## 2017-08-27 DIAGNOSIS — I10 Essential (primary) hypertension: Secondary | ICD-10-CM | POA: Diagnosis not present

## 2017-08-27 DIAGNOSIS — M542 Cervicalgia: Secondary | ICD-10-CM | POA: Diagnosis not present

## 2017-08-27 DIAGNOSIS — R600 Localized edema: Secondary | ICD-10-CM | POA: Diagnosis not present

## 2017-08-27 DIAGNOSIS — E1142 Type 2 diabetes mellitus with diabetic polyneuropathy: Secondary | ICD-10-CM | POA: Diagnosis not present

## 2017-08-27 DIAGNOSIS — I4891 Unspecified atrial fibrillation: Secondary | ICD-10-CM | POA: Diagnosis not present

## 2017-08-27 DIAGNOSIS — J449 Chronic obstructive pulmonary disease, unspecified: Secondary | ICD-10-CM | POA: Diagnosis not present

## 2017-08-27 DIAGNOSIS — Z6841 Body Mass Index (BMI) 40.0 and over, adult: Secondary | ICD-10-CM | POA: Diagnosis not present

## 2017-08-29 ENCOUNTER — Ambulatory Visit: Payer: Medicare HMO | Admitting: Urology

## 2017-08-29 DIAGNOSIS — N401 Enlarged prostate with lower urinary tract symptoms: Secondary | ICD-10-CM | POA: Diagnosis not present

## 2017-08-29 DIAGNOSIS — R351 Nocturia: Secondary | ICD-10-CM

## 2017-09-11 DIAGNOSIS — Z7189 Other specified counseling: Secondary | ICD-10-CM | POA: Diagnosis not present

## 2017-09-11 DIAGNOSIS — Z125 Encounter for screening for malignant neoplasm of prostate: Secondary | ICD-10-CM | POA: Diagnosis not present

## 2017-09-11 DIAGNOSIS — Z1211 Encounter for screening for malignant neoplasm of colon: Secondary | ICD-10-CM | POA: Diagnosis not present

## 2017-09-11 DIAGNOSIS — Z79899 Other long term (current) drug therapy: Secondary | ICD-10-CM | POA: Diagnosis not present

## 2017-09-11 DIAGNOSIS — E1165 Type 2 diabetes mellitus with hyperglycemia: Secondary | ICD-10-CM | POA: Diagnosis not present

## 2017-09-11 DIAGNOSIS — E7800 Pure hypercholesterolemia, unspecified: Secondary | ICD-10-CM | POA: Diagnosis not present

## 2017-09-11 DIAGNOSIS — Z1331 Encounter for screening for depression: Secondary | ICD-10-CM | POA: Diagnosis not present

## 2017-09-11 DIAGNOSIS — Z1339 Encounter for screening examination for other mental health and behavioral disorders: Secondary | ICD-10-CM | POA: Diagnosis not present

## 2017-09-11 DIAGNOSIS — F419 Anxiety disorder, unspecified: Secondary | ICD-10-CM | POA: Diagnosis not present

## 2017-09-11 DIAGNOSIS — R5383 Other fatigue: Secondary | ICD-10-CM | POA: Diagnosis not present

## 2017-09-11 DIAGNOSIS — Z0000 Encounter for general adult medical examination without abnormal findings: Secondary | ICD-10-CM | POA: Diagnosis not present

## 2017-10-23 DIAGNOSIS — M47816 Spondylosis without myelopathy or radiculopathy, lumbar region: Secondary | ICD-10-CM | POA: Diagnosis not present

## 2017-10-23 DIAGNOSIS — M9901 Segmental and somatic dysfunction of cervical region: Secondary | ICD-10-CM | POA: Diagnosis not present

## 2017-10-23 DIAGNOSIS — M9902 Segmental and somatic dysfunction of thoracic region: Secondary | ICD-10-CM | POA: Diagnosis not present

## 2017-10-23 DIAGNOSIS — M546 Pain in thoracic spine: Secondary | ICD-10-CM | POA: Diagnosis not present

## 2017-10-23 DIAGNOSIS — M9903 Segmental and somatic dysfunction of lumbar region: Secondary | ICD-10-CM | POA: Diagnosis not present

## 2017-10-30 ENCOUNTER — Ambulatory Visit: Payer: Self-pay | Admitting: Family

## 2017-10-31 ENCOUNTER — Encounter: Payer: Self-pay | Admitting: Family

## 2017-10-31 ENCOUNTER — Ambulatory Visit: Payer: Medicare HMO | Admitting: Urology

## 2017-10-31 DIAGNOSIS — R351 Nocturia: Secondary | ICD-10-CM | POA: Diagnosis not present

## 2017-10-31 DIAGNOSIS — N401 Enlarged prostate with lower urinary tract symptoms: Secondary | ICD-10-CM

## 2017-11-29 DIAGNOSIS — Z299 Encounter for prophylactic measures, unspecified: Secondary | ICD-10-CM | POA: Diagnosis not present

## 2017-11-29 DIAGNOSIS — Z6839 Body mass index (BMI) 39.0-39.9, adult: Secondary | ICD-10-CM | POA: Diagnosis not present

## 2017-11-29 DIAGNOSIS — H8110 Benign paroxysmal vertigo, unspecified ear: Secondary | ICD-10-CM | POA: Diagnosis not present

## 2017-11-29 DIAGNOSIS — I10 Essential (primary) hypertension: Secondary | ICD-10-CM | POA: Diagnosis not present

## 2017-11-29 DIAGNOSIS — J0100 Acute maxillary sinusitis, unspecified: Secondary | ICD-10-CM | POA: Diagnosis not present

## 2017-11-29 DIAGNOSIS — F419 Anxiety disorder, unspecified: Secondary | ICD-10-CM | POA: Diagnosis not present

## 2017-12-21 DIAGNOSIS — I4891 Unspecified atrial fibrillation: Secondary | ICD-10-CM | POA: Diagnosis not present

## 2017-12-21 DIAGNOSIS — I10 Essential (primary) hypertension: Secondary | ICD-10-CM | POA: Diagnosis not present

## 2017-12-21 DIAGNOSIS — Z299 Encounter for prophylactic measures, unspecified: Secondary | ICD-10-CM | POA: Diagnosis not present

## 2017-12-21 DIAGNOSIS — E1165 Type 2 diabetes mellitus with hyperglycemia: Secondary | ICD-10-CM | POA: Diagnosis not present

## 2017-12-21 DIAGNOSIS — M542 Cervicalgia: Secondary | ICD-10-CM | POA: Diagnosis not present

## 2017-12-21 DIAGNOSIS — Z6839 Body mass index (BMI) 39.0-39.9, adult: Secondary | ICD-10-CM | POA: Diagnosis not present

## 2017-12-21 DIAGNOSIS — M50323 Other cervical disc degeneration at C6-C7 level: Secondary | ICD-10-CM | POA: Diagnosis not present

## 2017-12-31 ENCOUNTER — Ambulatory Visit (INDEPENDENT_AMBULATORY_CARE_PROVIDER_SITE_OTHER): Payer: Medicare HMO | Admitting: *Deleted

## 2017-12-31 VITALS — BP 128/72 | HR 60

## 2017-12-31 DIAGNOSIS — R42 Dizziness and giddiness: Secondary | ICD-10-CM

## 2017-12-31 NOTE — Progress Notes (Signed)
Patient walked into office c/o dizziness.  Vitals obtained - 128/72  60  96%.  Patient states that the dizziness has been going on off/on x 3 months.  Does c/o chest discomfort as well for the same length of time.  SOB off/on - worsening with exertion.  Used to walk the track all the time and now states not able to do this.  States that his diabetes has been under control with good readings & good A1C.  Stated that he saw his pmd within the last week & was told to come back in 3 weeks.  Stated that he c/o pain in back of neck and head - pmd did x-rays that did not show anything per patient.  Stated he just does not feel good.  OV given for tomorrow with Joni ReiningKathryn Lawrence, [facility].  Advised he be evaluated in ED if symptoms worsen in the meantime.  Patient verbalized understanding.

## 2017-12-31 NOTE — Progress Notes (Signed)
Cardiology Office Note   Date:  01/01/2018   ID:  Coady, Train 06-06-1950, MRN [id number]  PCP:  Kirstie Peri, MD  Cardiologist:  Dr.Taylor   Chief Complaint  Patient presents with  . Shortness of Breath  . Dizziness     History of Present Illness: Justin [name] is a 67 y.o. male who presents for  ongoing assessment and management of PAF, hx of PSVT, and HTN. He was last seen in the office on 08/07/2017 at which time he had increased metoprolol to 12.5 mg BID on his own with improvement of symptoms concerning palpitations and dizziness. I added potassium 10 mEq daily to his regimen due to leg cramps. He was last seen for pre-operative clearance for urolift surgery on 08/09/2017.  He walked into our office on 12/31/2017 for complaints of dizziness and pain in the back of the neck and head. He has seen PCP who completed X-rays in June which did not show fracture.   He states that he feels dizzy everyday, especially when he bend over or when he stands from a sitting position. He has been taking all of his medications but has changed how he takes them. Instead of taking lasix as needed, he takes 1/2 tablet daily. Instead of taking losartan 25 mg daily to 1/2 tablet BID.    Past Medical History:  Diagnosis Date  . AF (atrial fibrillation) (HCC)   . Anxiety and depression   . Hypertension   . Hypothyroidism   . Obesity   . Palpitations   . SVT (supraventricular tachycardia) (HCC)     Past Surgical History:  Procedure Laterality Date  . CARDIOVASCULAR STRESS TEST  09/05/2007  . CHOLECYSTECTOMY    . CYSTOSCOPY WITH INSERTION OF UROLIFT N/A 08/13/2017   Procedure: CYSTOSCOPY WITH INSERTION OF UROLIFT;  Surgeon: Malen Gauze, MD;  Location: [facility];  Service: Urology;  Laterality: N/A;     Current Outpatient Medications  Medication Sig Dispense Refill  . ALPRAZolam (XANAX) 1 MG tablet Take 1 mg by mouth 3 (three) times daily.     Marland Kitchen aspirin EC 81 MG tablet Take 81 mg by  mouth daily.    . furosemide (LASIX) 20 MG tablet TAKE ONE TABLET BY MOUTH EVERY DAY AS NEEDED 90 tablet 1  . levothyroxine (SYNTHROID, LEVOTHROID) 125 MCG tablet Take 125 mcg by mouth daily.  7  . losartan (COZAAR) 25 MG tablet Take 25 mg by mouth daily.     . nitroGLYCERIN (NITROSTAT) 0.4 MG SL tablet Place 1 tablet (0.4 mg total) under the tongue every 5 (five) minutes as needed for chest pain. 25 tablet 3  . Omega-3 Krill Oil 1000 MG CAPS Take 1,000 mg by mouth daily.    Marland Kitchen PARoxetine (PAXIL) 20 MG tablet Take 20 mg by mouth daily.     . potassium chloride (K-DUR,KLOR-CON) 10 MEQ tablet TAKE ONE TABLET BY MOUTH EVERY DAY 90 tablet 1   No current facility-administered medications for this visit.     Allergies:   Haloperidol lactate    Social History:  The patient  reports that he has quit smoking. He has quit using smokeless tobacco.  His smokeless tobacco use included snuff. He reports that he does not drink alcohol or use drugs.   Family History:  The patient's family history includes Heart disease in his father and mother.    ROS: All other systems are reviewed and negative. Unless otherwise mentioned in H&P    PHYSICAL EXAM: VS:  BP 115/60   Pulse (!) 55   Ht 5\' 10"  (1.778 m)   Wt 268 lb (121.6 kg)   SpO2 97%   BMI 38.45 kg/m  , BMI Body mass index is 38.45 kg/m. GEN: Well nourished, well developed, in no acute distress.Morbidly obese  HEENT: normal Neck: no JVD, carotid bruits, or masses Cardiac: RRR; no murmurs, rubs, or gallops,no edema  Respiratory:  Clear to auscultation bilaterally, normal work of breathing GI: soft, nontender, nondistended, + BS MS: no deformity or atrophy Skin: warm and dry, no rash Neuro:  Strength and sensation are intact Psych: euthymic mood, full affect   EKG:  Sinus bradycardia rate of 55 bpm   Recent Labs: 07/18/2017: B Natriuretic Peptide 25.0; BUN 14; Creatinine, Ser 1.04; Hemoglobin 14.0; Platelets 136; Potassium 3.5; Sodium 138     Lipid Panel No results found for: CHOL, TRIG, HDL, CHOLHDL, VLDL, LDLCALC, LDLDIRECT    Wt Readings from Last 3 Encounters:  01/01/18 268 lb (121.6 kg)  08/09/17 274 lb 12.8 oz (124.6 kg)  07/18/17 270 lb (122.5 kg)      Other studies Reviewed:  Stress Echo 02/28/2013 Stress results:  Maximal heart rate during stress was 134bpm (85% of maximal predicted heart rate). The maximal predicted heart rate was 157bpm.The target heart rate was achieved. The heart rate response to stress was normal. There was a normal resting blood pressure. Normal blood pressure response to dobutamine. The rate-pressure product for the peak heart rate and blood pressure was 21147mm Hg/min. The patient experienced no chest pain during Stress.  NM Stress Test 08/25/2015  There was no ST segment deviation noted during stress.  Findings consistent with mild inferoapcal ischemia, cannot rule out slight differences in apical thinning as cause of defect. Either finding is low risk.  The left ventricular ejection fraction is hyperdynamic (>65%).  This is a low risk study.  ASSESSMENT AND PLAN:  1. Dizziness:  I have checked his orthostatic's. His BP was soft lying 107/67 HR 53. Sitting, 107/68 HR 53, Standing 112/64 HR 53, Standing 116/68 H 59 . I will stop the lasix that he is taking daily and have asked him to take it prn for weight gain of 3-5 lbs and edema. Metoprolol will be stopped as he in only taking 1/2 tablet as well.   2. Hypertension: He will continue losartan 25 mg as directed. BMET will be ordered.  3. Obesity: He is advised to increase his exercise and reduce calories. He is limited by dizziness. Hopefully the medication changes will be helpful and allow him to be more active.    Current medicines are reviewed at length with the patient today.    Labs/ tests ordered today include: BMET  Bettey MareKathryn M. Liborio NixonLawrence DNP, ANP, AACC   01/01/2018 11:16 AM    [facility]  [facility] [address] Office [phone] Fax [phone]

## 2018-01-01 ENCOUNTER — Encounter: Payer: Self-pay | Admitting: Adult Health

## 2018-01-01 ENCOUNTER — Ambulatory Visit: Payer: Medicare HMO | Admitting: Adult Health

## 2018-01-01 VITALS — BP 115/60 | HR 55 | Ht 70.0 in | Wt 268.0 lb

## 2018-01-01 DIAGNOSIS — R42 Dizziness and giddiness: Secondary | ICD-10-CM | POA: Diagnosis not present

## 2018-01-01 DIAGNOSIS — R002 Palpitations: Secondary | ICD-10-CM | POA: Diagnosis not present

## 2018-01-01 DIAGNOSIS — I952 Hypotension due to drugs: Secondary | ICD-10-CM

## 2018-01-01 DIAGNOSIS — Z79899 Other long term (current) drug therapy: Secondary | ICD-10-CM | POA: Diagnosis not present

## 2018-01-01 DIAGNOSIS — I480 Paroxysmal atrial fibrillation: Secondary | ICD-10-CM | POA: Diagnosis not present

## 2018-01-01 MED ORDER — FUROSEMIDE 20 MG PO TABS: 20.0000 mg | ORAL_TABLET | Freq: Every day | ORAL | 1 refills | Status: DC | PRN

## 2018-01-01 NOTE — Patient Instructions (Addendum)
  Medication Instructions:  TAKE LASIX ONLY AS NEEDED FOR WEIGHT GAIN OF >3# IN A DAY OR 5# IN A WEEK.  STOP METOPROLOL  CONTINUE LOSARTAN  If you need a refill on your cardiac medications before your next appointment, please call your pharmacy.  Labwork: BMET AND TSH TODAY HERE IN OUR OFFICE AT [facility]  Take the provided lab slips with you to the lab for your blood draw.  If you have labs (blood work) drawn today and your tests are completely normal, you will receive your results only by: Marland Kitchen. MyChart Message (if you have MyChart) OR . A paper copy in the mail If you have any lab test that is abnormal or we need to change your treatment, we will call you to review the results.  Follow-Up: You will need a follow up appointment in 3-4 WEEKS.  You may see  DR Vonda AntiguaAYLOR,  Kathryn Lawrence, DNP, AACC or one of the following Advanced Practice Providers on your designated Care Team. At [facility], you and your health needs are our priority.  As part of our continuing mission to provide you with exceptional heart care, we have created designated Provider Care Teams.  These Care Teams include your primary Cardiologist (physician) and Advanced Practice Providers (APPs -  Physician Assistants and Nurse Practitioners) who all work together to provide you with the care you need, when you need it.  Thank you for choosing [facility] at [facility]!!

## 2018-01-02 LAB — BASIC METABOLIC PANEL
BUN / CREAT RATIO: 10 (ref 10–24)
BUN: 13 mg/dL (ref 8–27)
CO2: 25 mmol/L (ref 20–29)
Calcium: 8.5 mg/dL — ABNORMAL LOW (ref 8.6–10.2)
Chloride: 103 mmol/L (ref 96–106)
Creatinine, Ser: 1.28 mg/dL — ABNORMAL HIGH (ref 0.76–1.27)
GFR, EST AFRICAN AMERICAN: 66 mL/min/{1.73_m2} (ref >59)
GFR, EST NON AFRICAN AMERICAN: 57 mL/min/{1.73_m2} — AB (ref >59)
Glucose: 82 mg/dL (ref 65–99)
POTASSIUM: 4.5 mmol/L (ref 3.5–5.2)
SODIUM: 144 mmol/L (ref 134–144)

## 2018-01-02 LAB — TSH: TSH: 1.43 u[IU]/mL (ref 0.450–4.500)

## 2018-01-07 ENCOUNTER — Other Ambulatory Visit: Payer: Self-pay | Admitting: Adult Health

## 2018-01-09 DIAGNOSIS — M545 Low back pain: Secondary | ICD-10-CM | POA: Diagnosis not present

## 2018-01-09 DIAGNOSIS — R42 Dizziness and giddiness: Secondary | ICD-10-CM | POA: Diagnosis not present

## 2018-01-11 DIAGNOSIS — M542 Cervicalgia: Secondary | ICD-10-CM | POA: Diagnosis not present

## 2018-01-11 DIAGNOSIS — I10 Essential (primary) hypertension: Secondary | ICD-10-CM | POA: Diagnosis not present

## 2018-01-11 DIAGNOSIS — Z299 Encounter for prophylactic measures, unspecified: Secondary | ICD-10-CM | POA: Diagnosis not present

## 2018-01-11 DIAGNOSIS — Z6839 Body mass index (BMI) 39.0-39.9, adult: Secondary | ICD-10-CM | POA: Diagnosis not present

## 2018-01-15 DIAGNOSIS — R42 Dizziness and giddiness: Secondary | ICD-10-CM | POA: Diagnosis not present

## 2018-01-15 DIAGNOSIS — M545 Low back pain: Secondary | ICD-10-CM | POA: Diagnosis not present

## 2018-01-18 DIAGNOSIS — M47812 Spondylosis without myelopathy or radiculopathy, cervical region: Secondary | ICD-10-CM | POA: Diagnosis not present

## 2018-01-18 DIAGNOSIS — M79642 Pain in left hand: Secondary | ICD-10-CM | POA: Diagnosis not present

## 2018-01-18 DIAGNOSIS — M4802 Spinal stenosis, cervical region: Secondary | ICD-10-CM | POA: Diagnosis not present

## 2018-01-18 DIAGNOSIS — M79641 Pain in right hand: Secondary | ICD-10-CM | POA: Diagnosis not present

## 2018-01-18 DIAGNOSIS — M542 Cervicalgia: Secondary | ICD-10-CM | POA: Diagnosis not present

## 2018-01-18 DIAGNOSIS — M4722 Other spondylosis with radiculopathy, cervical region: Secondary | ICD-10-CM | POA: Diagnosis not present

## 2018-01-24 DIAGNOSIS — I4891 Unspecified atrial fibrillation: Secondary | ICD-10-CM | POA: Diagnosis not present

## 2018-01-24 DIAGNOSIS — M542 Cervicalgia: Secondary | ICD-10-CM | POA: Diagnosis not present

## 2018-01-24 DIAGNOSIS — Z6839 Body mass index (BMI) 39.0-39.9, adult: Secondary | ICD-10-CM | POA: Diagnosis not present

## 2018-01-24 DIAGNOSIS — Z299 Encounter for prophylactic measures, unspecified: Secondary | ICD-10-CM | POA: Diagnosis not present

## 2018-01-24 DIAGNOSIS — E1165 Type 2 diabetes mellitus with hyperglycemia: Secondary | ICD-10-CM | POA: Diagnosis not present

## 2018-01-24 DIAGNOSIS — I10 Essential (primary) hypertension: Secondary | ICD-10-CM | POA: Diagnosis not present

## 2018-01-24 DIAGNOSIS — E1142 Type 2 diabetes mellitus with diabetic polyneuropathy: Secondary | ICD-10-CM | POA: Diagnosis not present

## 2018-01-31 ENCOUNTER — Encounter: Payer: Self-pay | Admitting: Internal Medicine

## 2018-01-31 ENCOUNTER — Ambulatory Visit (INDEPENDENT_AMBULATORY_CARE_PROVIDER_SITE_OTHER): Payer: Medicare Other | Admitting: Internal Medicine

## 2018-01-31 VITALS — BP 110/68 | HR 87 | Ht 70.0 in | Wt 265.4 lb

## 2018-01-31 DIAGNOSIS — R42 Dizziness and giddiness: Secondary | ICD-10-CM | POA: Diagnosis not present

## 2018-01-31 DIAGNOSIS — I480 Paroxysmal atrial fibrillation: Secondary | ICD-10-CM | POA: Diagnosis not present

## 2018-01-31 DIAGNOSIS — I10 Essential (primary) hypertension: Secondary | ICD-10-CM

## 2018-01-31 DIAGNOSIS — R002 Palpitations: Secondary | ICD-10-CM | POA: Diagnosis not present

## 2018-01-31 NOTE — Progress Notes (Signed)
HPI [name] returns today for followup. He is a 68 yo man with PAF, and SVT, and HTN. I have not seen him in over 2 years. His blood pressure has been controlled. No syncope. He complains of dizziness. He has had his beta blocker reduced/stopped and his dizziness has not changed. He denies chest pain or sob. He notes the the dizziness is constant but that it gets worse when he bends over.  Allergies  Allergen Reactions  . Haloperidol Lactate Other (See Comments)    Broke the hospital bed     Current Outpatient Medications  Medication Sig Dispense Refill  . ALPRAZolam (XANAX) 1 MG tablet Take 1 mg by mouth 3 (three) times daily.     Marland Kitchen. aspirin EC 81 MG tablet Take 81 mg by mouth daily.    . furosemide (LASIX) 20 MG tablet Take 1 tablet (20 mg total) by mouth daily as needed. TAKE ONLY AS NEEDED >3# IN A DAY OR 5# IN A WEEK 90 tablet 1  . levothyroxine (SYNTHROID, LEVOTHROID) 125 MCG tablet Take 125 mcg by mouth daily.  7  . losartan (COZAAR) 25 MG tablet Take 25 mg by mouth daily.     . nitroGLYCERIN (NITROSTAT) 0.4 MG SL tablet Place 1 tablet (0.4 mg total) under the tongue every 5 (five) minutes as needed for chest pain. 25 tablet 3  . Omega-3 Krill Oil 1000 MG CAPS Take 1,000 mg by mouth daily.    Marland Kitchen. PARoxetine (PAXIL) 20 MG tablet Take 20 mg by mouth daily.     . potassium chloride (K-DUR,KLOR-CON) 10 MEQ tablet TAKE 1 TABLET BY MOUTH EVERY DAY 90 tablet 1   No current facility-administered medications for this visit.      Past Medical History:  Diagnosis Date  . AF (atrial fibrillation) (HCC)   . Anxiety and depression   . Hypertension   . Hypothyroidism   . Obesity   . Palpitations   . SVT (supraventricular tachycardia) (HCC)     ROS:   All systems reviewed and negative except as noted in the HPI.   Past Surgical History:  Procedure Laterality Date  . CARDIOVASCULAR STRESS TEST  09/05/2007  . CHOLECYSTECTOMY    . CYSTOSCOPY WITH INSERTION OF UROLIFT N/A  08/13/2017   Procedure: CYSTOSCOPY WITH INSERTION OF UROLIFT;  Surgeon: Malen GauzeMcKenzie, Patrick L, MD;  Location: [facility];  Service: Urology;  Laterality: N/A;     Family History  Problem Relation Age of Onset  . Heart disease Mother   . Heart disease Father      Social History   Socioeconomic History  . Marital status: Married    Spouse name: Not on file  . Number of children: Not on file  . Years of education: Not on file  . Highest education level: Not on file  Occupational History  . Not on file  Social Needs  . Financial resource strain: Not on file  . Food insecurity:    Worry: Not on file    Inability: Not on file  . Transportation needs:    Medical: Not on file    Non-medical: Not on file  Tobacco Use  . Smoking status: Former Games developermoker  . Smokeless tobacco: Former NeurosurgeonUser    Types: Snuff  Substance and Sexual Activity  . Alcohol use: No  . Drug use: No  . Sexual activity: Not on file  Lifestyle  . Physical activity:    Days per week: Not on file  Minutes per session: Not on file  . Stress: Not on file  Relationships  . Social connections:    Talks on phone: Not on file    Gets together: Not on file    Attends religious service: Not on file    Active member of club or organization: Not on file    Attends meetings of clubs or organizations: Not on file    Relationship status: Not on file  . Intimate partner violence:    Fear of current or ex partner: Not on file    Emotionally abused: Not on file    Physically abused: Not on file    Forced sexual activity: Not on file  Other Topics Concern  . Not on file  Social History Narrative  . Not on file     BP 110/68   Pulse 87   Ht 5\' 10"  (1.778 m)   Wt 265 lb 6.4 oz (120.4 kg)   SpO2 97%   BMI 38.08 kg/m   Physical Exam:  Well appearing 68 yo obese man, NAD HEENT: Unremarkable Neck:  No JVD, no thyromegally Lymphatics:  No adenopathy Back:  No CVA tenderness Lungs:  Clear with no wheezes HEART:   Regular rate rhythm, no murmurs, no rubs, no clicks Abd:  soft, positive bowel sounds, no organomegally, no rebound, no guarding Ext:  2 plus pulses, no edema, no cyanosis, no clubbing Skin:  No rashes no nodules Neuro:  CN II through XII intact, motor grossly intact  EKG - nsr with first degree AV block   Assess/Plan: 1. Dizziness - this is his biggest problem. He states that he got an MRI in [place] but I cannot find his report. His dizziness has not improved with stopping the beta blocker. Because he is quite incapacitated, I have recommended he followup with [facility] neurology. 2. PAF - he has palpitations. No prolonged arrhythmias.  3. SVT/atrial fib - he is maintaining NSR. I cannot rule out some break through arrhythmias but his dizziness is constant and clearly not related to low blood pressure, fast , or slow HR's.   Leonia Reeves.D.

## 2018-01-31 NOTE — Patient Instructions (Addendum)
Medication Instructions:  Your physician recommends that you continue on your current medications as directed. Please refer to the Current Medication list given to you today.  Labwork: None ordered.  Testing/Procedures: None ordered.  Follow-Up: Your physician wants you to follow-up in: 6 months with Dr. Ladona Ridgel.  You will receive a reminder letter in the mail two months in advance. If you don't receive a letter, please call our office to schedule the follow-up appointment.  Any Other Special Instructions Will Be Listed Below (If Applicable).  Referral placed for you with [facility].  They will call you with an appointment.  If you need a refill on your cardiac medications before your next appointment, please call your pharmacy.

## 2018-04-15 DIAGNOSIS — K118 Other diseases of salivary glands: Secondary | ICD-10-CM | POA: Insufficient documentation

## 2018-04-26 ENCOUNTER — Other Ambulatory Visit: Payer: Self-pay | Admitting: Otolaryngology

## 2018-04-26 DIAGNOSIS — K118 Other diseases of salivary glands: Secondary | ICD-10-CM

## 2018-04-29 ENCOUNTER — Other Ambulatory Visit: Payer: Self-pay

## 2018-04-29 ENCOUNTER — Ambulatory Visit
Admission: RE | Admit: 2018-04-29 | Discharge: 2018-04-29 | Disposition: A | Discharge status: Home / Self Care | Payer: Medicare Other | Source: Ambulatory Visit | Attending: Otolaryngology | Admitting: Otolaryngology

## 2018-04-29 DIAGNOSIS — K118 Other diseases of salivary glands: Secondary | ICD-10-CM

## 2018-04-29 MED ORDER — IOPAMIDOL (ISOVUE-300) INJECTION 61%
75.0000 mL | Freq: Once | INTRAVENOUS | Status: AC | PRN
  Administered 2018-04-29: 75 mL via INTRAVENOUS

## 2018-07-01 ENCOUNTER — Telehealth: Payer: Self-pay | Admitting: Internal Medicine

## 2018-07-01 NOTE — Telephone Encounter (Signed)
Outreach made to Pt.  No answer.  Call did not go to VM.  

## 2018-07-01 NOTE — Telephone Encounter (Signed)
Pt returned call.  States he feels his heart racing and then he almost passes out.  Due for 6  Month f/u. Has had beta blocker discontinued d/t dizziness previously.  Will make in office visit for evaluation.  Pt notified of in office visit June 4 at 1240 at [facility] office.

## 2018-07-01 NOTE — Telephone Encounter (Signed)
° ° °  STAT if HR is under 50 or over 120 (normal HR is 60-100 beats per minute)  1) What is your heart rate? Unsure of HR today  2) Do you have a log of your heart rate readings (document readings)? 54-->106  3) Do you have any other symptoms? Dizziness, left arm pain     Pt c/o swelling: STAT is pt has developed SOB within 24 hours  1) How much weight have you gained and in what time span? N/A  2) If swelling, where is the swelling located? FOOT  3) Are you currently taking a fluid pill? YES  4) Are you currently SOB? NO  5) Do you have a log of your daily weights (if so, list)? 262-->265  6) Have you gained 3 pounds in a day or 5 pounds in a week? N/A  7) Have you traveled recently? NO

## 2018-07-03 ENCOUNTER — Telehealth: Payer: Self-pay

## 2018-07-03 NOTE — Telephone Encounter (Signed)
Left message regarding appt on 07/04/18. 

## 2018-07-03 NOTE — Telephone Encounter (Signed)
Spoke with pt regarding covid-19 screening prior to appt. Pt stated he has not been in contact with anyone who may have covid-19 and has no symptoms. 

## 2018-07-03 NOTE — Telephone Encounter (Signed)
° ° °  Please return call to patient at [phone]

## 2018-07-04 ENCOUNTER — Ambulatory Visit (INDEPENDENT_AMBULATORY_CARE_PROVIDER_SITE_OTHER): Payer: Medicare Other | Admitting: Internal Medicine

## 2018-07-04 ENCOUNTER — Other Ambulatory Visit: Payer: Self-pay

## 2018-07-04 ENCOUNTER — Telehealth: Payer: Self-pay | Admitting: *Deleted

## 2018-07-04 ENCOUNTER — Encounter: Payer: Self-pay | Admitting: Internal Medicine

## 2018-07-04 VITALS — BP 106/68 | HR 65 | Ht 70.0 in | Wt 265.0 lb

## 2018-07-04 DIAGNOSIS — R42 Dizziness and giddiness: Secondary | ICD-10-CM | POA: Diagnosis not present

## 2018-07-04 DIAGNOSIS — I10 Essential (primary) hypertension: Secondary | ICD-10-CM | POA: Diagnosis not present

## 2018-07-04 DIAGNOSIS — I480 Paroxysmal atrial fibrillation: Secondary | ICD-10-CM | POA: Diagnosis not present

## 2018-07-04 DIAGNOSIS — R002 Palpitations: Secondary | ICD-10-CM | POA: Diagnosis not present

## 2018-07-04 DIAGNOSIS — R5383 Other fatigue: Secondary | ICD-10-CM

## 2018-07-04 DIAGNOSIS — R0600 Dyspnea, unspecified: Secondary | ICD-10-CM

## 2018-07-04 NOTE — Telephone Encounter (Signed)
[employer] to mail a 14 day ZIO XT long term holter monitor to the patients home.  Instructions reviewed briefly as they are included in the monitor kit.

## 2018-07-04 NOTE — Progress Notes (Signed)
HPI [name] returns today for followup of PAF, SVT and HTN. He is a pleasant 68 yo man who I saw 5 months ago with the above problems. He has had worsening palpitations/weakness and fatigue. He feels poorly and has no energy. He denies anginal symptoms. No syncope. No edema.  Allergies  Allergen Reactions  . Haloperidol Lactate Other (See Comments)    Broke the hospital bed     Current Outpatient Medications  Medication Sig Dispense Refill  . ALPRAZolam (XANAX) 1 MG tablet Take 1 mg by mouth 3 (three) times daily.     Marland Kitchen aspirin EC 81 MG tablet Take 81 mg by mouth daily.    . furosemide (LASIX) 20 MG tablet Take 1 tablet (20 mg total) by mouth daily as needed. TAKE ONLY AS NEEDED >3# IN A DAY OR 5# IN A WEEK 90 tablet 1  . levothyroxine (SYNTHROID, LEVOTHROID) 125 MCG tablet Take 125 mcg by mouth daily.  7  . losartan (COZAAR) 25 MG tablet Take 25 mg by mouth daily.     . nitroGLYCERIN (NITROSTAT) 0.4 MG SL tablet Place 1 tablet (0.4 mg total) under the tongue every 5 (five) minutes as needed for chest pain. 25 tablet 3  . Omega-3 Krill Oil 1000 MG CAPS Take 1,000 mg by mouth daily.    Marland Kitchen PARoxetine (PAXIL) 20 MG tablet Take 20 mg by mouth daily.     . potassium chloride (K-DUR,KLOR-CON) 10 MEQ tablet TAKE 1 TABLET BY MOUTH EVERY DAY 90 tablet 1   No current facility-administered medications for this visit.      Past Medical History:  Diagnosis Date  . AF (atrial fibrillation) (HCC)   . Anxiety and depression   . Hypertension   . Hypothyroidism   . Obesity   . Palpitations   . SVT (supraventricular tachycardia) (HCC)     ROS:   All systems reviewed and negative except as noted in the HPI.   Past Surgical History:  Procedure Laterality Date  . CARDIOVASCULAR STRESS TEST  09/05/2007  . CHOLECYSTECTOMY    . CYSTOSCOPY WITH INSERTION OF UROLIFT N/A 08/13/2017   Procedure: CYSTOSCOPY WITH INSERTION OF UROLIFT;  Surgeon: Malen Gauze, MD;  Location: [facility];   Service: Urology;  Laterality: N/A;     Family History  Problem Relation Age of Onset  . Heart disease Mother   . Heart disease Father      Social History   Socioeconomic History  . Marital status: Married    Spouse name: Not on file  . Number of children: Not on file  . Years of education: Not on file  . Highest education level: Not on file  Occupational History  . Not on file  Social Needs  . Financial resource strain: Not on file  . Food insecurity:    Worry: Not on file    Inability: Not on file  . Transportation needs:    Medical: Not on file    Non-medical: Not on file  Tobacco Use  . Smoking status: Former Games developer  . Smokeless tobacco: Former Neurosurgeon    Types: Snuff  Substance and Sexual Activity  . Alcohol use: No  . Drug use: No  . Sexual activity: Not on file  Lifestyle  . Physical activity:    Days per week: Not on file    Minutes per session: Not on file  . Stress: Not on file  Relationships  . Social connections:  Talks on phone: Not on file    Gets together: Not on file    Attends religious service: Not on file    Active member of club or organization: Not on file    Attends meetings of clubs or organizations: Not on file    Relationship status: Not on file  . Intimate partner violence:    Fear of current or ex partner: Not on file    Emotionally abused: Not on file    Physically abused: Not on file    Forced sexual activity: Not on file  Other Topics Concern  . Not on file  Social History Narrative  . Not on file     BP 106/68   Pulse 65   Ht 5\' 10"  (1.778 m)   Wt 265 lb (120.2 kg)   SpO2 97%   BMI 38.02 kg/m   Physical Exam:  Well appearing obese man, NAD HEENT: Unremarkable Neck:  6 cm JVD, no thyromegally Lymphatics:  No adenopathy Back:  No CVA tenderness Lungs:  Clear with no wheezes HEART:  Regular rate rhythm, no murmurs, no rubs, no clicks Abd:  soft, positive bowel sounds, no organomegally, no rebound, no guarding  Ext:  2 plus pulses, no edema, no cyanosis, no clubbing Skin:  No rashes no nodules Neuro:  CN II through XII intact, motor grossly intact  EKG - NSR with first degree AV block  Assess/Plan: 1. Palpitations - unclear as to the etiology. He states that he has episodes almost daily. I will have him obtain a zio patch. 2. Severe Fatigue, sob and weakness - this is constant and not episodic. I have asked him to obtain a 2D echo and we will also draw labs today to be sure he is not anemic or have thyroid dysfunction or renal failure. 3. Dizziness - he has this mostly with his palpitations but at other times as well. He has stopped his losartan.  4. Obesity - he needs to lose weight. We will work on this.  Leonia ReevesGregg Vernell Townley,M.D.

## 2018-07-04 NOTE — Patient Instructions (Addendum)
Medication Instructions:  Your physician recommends that you continue on your current medications as directed. Please refer to the Current Medication list given to you today.  Labwork: You will get lab work today:  Sed rate, BMP, CBC and TSH  Testing/Procedures: Your physician has requested that you have an echocardiogram. Echocardiography is a painless test that uses sound waves to create images of your heart. It provides your doctor with information about the size and shape of your heart and how well your heart's chambers and valves are working. This procedure takes approximately one hour. There are no restrictions for this procedure.  Please schedule for an ECHO  Your physician has recommended that you wear a holter monitor. Holter monitors are medical devices that record the heart's electrical activity. Doctors most often use these monitors to diagnose arrhythmias. Arrhythmias are problems with the speed or rhythm of the heartbeat. The monitor is a small, portable device. You can wear one while you do your normal daily activities. This is usually used to diagnose what is causing palpitations/syncope (passing out).  Please schedule for 14 day monitor  Follow-Up: Your physician wants you to follow-up in: 2 months with Dr. Ladona Ridgel in [place]   Any Other Special Instructions Will Be Listed Below (If Applicable).  If you need a refill on your cardiac medications before your next appointment, please call your pharmacy.

## 2018-07-05 LAB — CBC WITH DIFFERENTIAL/PLATELET
Basophils Absolute: 0.1 10*3/uL (ref 0.0–0.2)
Basos: 1 %
EOS (ABSOLUTE): 1.1 10*3/uL — ABNORMAL HIGH (ref 0.0–0.4)
Eos: 16 %
Hematocrit: 43.3 % (ref 37.5–51.0)
Hemoglobin: 15.3 g/dL (ref 13.0–17.7)
Immature Grans (Abs): 0 10*3/uL (ref 0.0–0.1)
Immature Granulocytes: 0 %
Lymphocytes Absolute: 1.8 10*3/uL (ref 0.7–3.1)
Lymphs: 28 %
MCH: 33.6 pg — ABNORMAL HIGH (ref 26.6–33.0)
MCHC: 35.3 g/dL (ref 31.5–35.7)
MCV: 95 fL (ref 79–97)
Monocytes Absolute: 0.5 10*3/uL (ref 0.1–0.9)
Monocytes: 8 %
Neutrophils Absolute: 3 10*3/uL (ref 1.4–7.0)
Neutrophils: 47 %
Platelets: 144 10*3/uL — ABNORMAL LOW (ref 150–450)
RBC: 4.55 x10E6/uL (ref 4.14–5.80)
RDW: 12.9 % (ref 11.6–15.4)
WBC: 6.5 10*3/uL (ref 3.4–10.8)

## 2018-07-05 LAB — BASIC METABOLIC PANEL
BUN/Creatinine Ratio: 11 (ref 10–24)
BUN: 13 mg/dL (ref 8–27)
CO2: 24 mmol/L (ref 20–29)
Calcium: 8.5 mg/dL — ABNORMAL LOW (ref 8.6–10.2)
Chloride: 104 mmol/L (ref 96–106)
Creatinine, Ser: 1.22 mg/dL (ref 0.76–1.27)
GFR calc Af Amer: 70 mL/min/{1.73_m2} (ref >59)
GFR calc non Af Amer: 61 mL/min/{1.73_m2} (ref >59)
Glucose: 99 mg/dL (ref 65–99)
Potassium: 3.9 mmol/L (ref 3.5–5.2)
Sodium: 142 mmol/L (ref 134–144)

## 2018-07-05 LAB — SEDIMENTATION RATE: Sed Rate: 16 mm/hr (ref 0–30)

## 2018-07-05 LAB — TSH: TSH: 0.658 u[IU]/mL (ref 0.450–4.500)

## 2018-07-05 NOTE — Addendum Note (Signed)
Addended by: Solon Augusta on: 07/05/2018 12:18 PM   Modules accepted: Orders

## 2018-07-05 NOTE — Addendum Note (Signed)
Addended by: Joslyne Marshburn H on: 07/05/2018 12:18 PM   Modules accepted: Orders  

## 2018-07-08 ENCOUNTER — Ambulatory Visit (INDEPENDENT_AMBULATORY_CARE_PROVIDER_SITE_OTHER): Payer: Medicare Other

## 2018-07-08 DIAGNOSIS — I480 Paroxysmal atrial fibrillation: Secondary | ICD-10-CM | POA: Diagnosis not present

## 2018-07-08 DIAGNOSIS — R0600 Dyspnea, unspecified: Secondary | ICD-10-CM | POA: Diagnosis not present

## 2018-07-08 DIAGNOSIS — R5383 Other fatigue: Secondary | ICD-10-CM

## 2018-07-23 ENCOUNTER — Telehealth (HOSPITAL_COMMUNITY): Payer: Self-pay | Admitting: Radiology

## 2018-07-23 NOTE — Telephone Encounter (Signed)
Left message on voice mail  to call back

## 2018-07-23 NOTE — Telephone Encounter (Signed)

## 2018-07-24 ENCOUNTER — Ambulatory Visit (HOSPITAL_COMMUNITY): Payer: Medicare Other | Attending: Cardiology

## 2018-07-24 ENCOUNTER — Other Ambulatory Visit: Payer: Self-pay

## 2018-07-24 DIAGNOSIS — R0600 Dyspnea, unspecified: Secondary | ICD-10-CM | POA: Diagnosis present

## 2018-07-24 DIAGNOSIS — R5383 Other fatigue: Secondary | ICD-10-CM | POA: Diagnosis present

## 2018-07-24 MED ORDER — PERFLUTREN LIPID MICROSPHERE
1.0000 mL | INTRAVENOUS | Status: AC | PRN
  Administered 2018-07-24: 2 mL via INTRAVENOUS

## 2018-07-25 ENCOUNTER — Telehealth: Payer: Self-pay | Admitting: Internal Medicine

## 2018-07-25 ENCOUNTER — Other Ambulatory Visit: Payer: Self-pay | Admitting: Otolaryngology

## 2018-07-25 NOTE — Telephone Encounter (Signed)
New Message             Patient is calling for results, pls call and advise.

## 2018-07-30 ENCOUNTER — Other Ambulatory Visit: Payer: Self-pay

## 2018-07-30 ENCOUNTER — Other Ambulatory Visit: Payer: Self-pay | Admitting: Internal Medicine

## 2018-07-30 DIAGNOSIS — R5383 Other fatigue: Secondary | ICD-10-CM

## 2018-07-30 DIAGNOSIS — R0600 Dyspnea, unspecified: Secondary | ICD-10-CM

## 2018-07-30 DIAGNOSIS — I480 Paroxysmal atrial fibrillation: Secondary | ICD-10-CM

## 2018-08-01 NOTE — Pre-Procedure Instructions (Signed)
[place], [place] [address] [place] [address] Phone: [phone] Fax: [phone]      Your procedure is scheduled on  08-07-18 Wednesday  Report to [facility] Main Entrance "A" at  0630A.M., and check in at the Admitting office.  Call this number if you have problems the morning of surgery:  [phone]  Call [phone] if you have any questions prior to your surgery date Monday-Friday 8am-4pm    Remember:  Do not eat or drink after midnight the night before your surgery    Take these medicines the morning of surgery with A SIP OF WATER: ALPRAZolam (XANAX levothyroxine (SYNTHROID, LEVOTHROID) tamsulosin (FLOMAX)  PARoxetine (PAXIL)  metoprolol tartrate (LOPRESSOR)as needed nitroGLYCERIN (NITROSTAT)as needed Follow your surgeon's instructions on when to stop Aspirin.  If no instructions were given by your surgeon then you will need to call the office to get those instructions.   7 days prior to surgery STOP taking any Aspirin (unless otherwise instructed by your surgeon), Aleve, Naproxen, Ibuprofen, Motrin, Advil, Goody's, BC's, all herbal medications, fish oil, and all vitamins.    The Morning of Surgery  Do not wear jewelry.  Do not wear lotions, powders, or /colognes, or deodorant    Men may shave face and neck.  Do not bring valuables to the hospital.  [facility] is not responsible for any belongings or valuables.  If you are a smoker, DO NOT Smoke 24 hours prior to surgery IF you wear a CPAP at night please bring your mask, tubing, and machine the morning of surgery   Remember that you must have someone to transport you home after your surgery, and remain with you for 24 hours if you are discharged the same day.   Contacts, glasses, hearing aids, dentures or bridgework may not be worn into surgery.    Leave your suitcase in the car.  After surgery it may be brought to your room.  For patients admitted to the  hospital, discharge time will be determined by your treatment team.  Patients discharged the day of surgery will not be allowed to drive home.    Special instructions:   [facility]- Preparing For Surgery  Before surgery, you can play an important role. Because skin is not sterile, your skin needs to be as free of germs as possible. You can reduce the number of germs on your skin by washing with CHG (chlorahexidine gluconate) Soap before surgery.  CHG is an antiseptic cleaner which kills germs and bonds with the skin to continue killing germs even after washing.    Oral Hygiene is also important to reduce your risk of infection.  Remember - BRUSH YOUR TEETH THE MORNING OF SURGERY WITH YOUR REGULAR TOOTHPASTE  Please do not use if you have an allergy to CHG or antibacterial soaps. If your skin becomes reddened/irritated stop using the CHG.  Do not shave (including legs and underarms) for at least 48 hours prior to first CHG shower. It is OK to shave your face.  Please follow these instructions carefully.   1. Shower the NIGHT BEFORE SURGERY and the MORNING OF SURGERY with CHG Soap.   2. If you chose to wash your hair, wash your hair first as usual with your normal shampoo.  3. After you shampoo, rinse your hair and body thoroughly to remove the shampoo.  4. Use CHG as you would any other liquid soap. You can apply CHG directly to the  skin and wash gently with a scrungie or a clean washcloth.   5. Apply the CHG Soap to your body ONLY FROM THE NECK DOWN.  Do not use on open wounds or open sores. Avoid contact with your eyes, ears, mouth and genitals (private parts). Wash Face and genitals (private parts)  with your normal soap.   6. Wash thoroughly, paying special attention to the area where your surgery will be performed.  7. Thoroughly rinse your body with warm water from the neck down.  8. DO NOT shower/wash with your normal soap after using and rinsing off the CHG Soap.  9. Pat  yourself dry with a CLEAN TOWEL.  10. Wear CLEAN PAJAMAS to bed the night before surgery, wear comfortable clothes the morning of surgery  11. Place CLEAN SHEETS on your bed the night of your first shower and DO NOT SLEEP WITH PETS.    Day of Surgery:  Do not apply any deodorants/lotions. Please shower the morning of surgery with the CHG soap  Please wear clean clothes to the hospital/surgery center.   Remember to brush your teeth WITH YOUR REGULAR TOOTHPASTE.   Please read over the  fact sheets that you were given.

## 2018-08-05 ENCOUNTER — Other Ambulatory Visit: Payer: Self-pay

## 2018-08-05 ENCOUNTER — Other Ambulatory Visit (HOSPITAL_COMMUNITY)
Admission: RE | Admit: 2018-08-05 | Discharge: 2018-08-05 | Disposition: A | Discharge status: Home / Self Care | Payer: Medicare Other | Source: Ambulatory Visit | Attending: Otolaryngology | Admitting: Otolaryngology

## 2018-08-05 ENCOUNTER — Encounter (HOSPITAL_COMMUNITY): Payer: Self-pay

## 2018-08-05 ENCOUNTER — Encounter (HOSPITAL_COMMUNITY)
Admission: RE | Admit: 2018-08-05 | Discharge: 2018-08-05 | Disposition: A | Discharge status: Home / Self Care | Payer: Medicare Other | Source: Ambulatory Visit | Attending: Otolaryngology | Admitting: Otolaryngology

## 2018-08-05 DIAGNOSIS — Z6838 Body mass index (BMI) 38.0-38.9, adult: Secondary | ICD-10-CM | POA: Diagnosis not present

## 2018-08-05 DIAGNOSIS — Z79899 Other long term (current) drug therapy: Secondary | ICD-10-CM | POA: Diagnosis not present

## 2018-08-05 DIAGNOSIS — F419 Anxiety disorder, unspecified: Secondary | ICD-10-CM | POA: Diagnosis not present

## 2018-08-05 DIAGNOSIS — Z888 Allergy status to other drugs, medicaments and biological substances status: Secondary | ICD-10-CM | POA: Diagnosis not present

## 2018-08-05 DIAGNOSIS — F329 Major depressive disorder, single episode, unspecified: Secondary | ICD-10-CM | POA: Diagnosis not present

## 2018-08-05 DIAGNOSIS — Z79890 Hormone replacement therapy (postmenopausal): Secondary | ICD-10-CM | POA: Diagnosis not present

## 2018-08-05 DIAGNOSIS — E039 Hypothyroidism, unspecified: Secondary | ICD-10-CM | POA: Diagnosis not present

## 2018-08-05 DIAGNOSIS — Z1159 Encounter for screening for other viral diseases: Secondary | ICD-10-CM | POA: Diagnosis not present

## 2018-08-05 DIAGNOSIS — I10 Essential (primary) hypertension: Secondary | ICD-10-CM | POA: Diagnosis not present

## 2018-08-05 DIAGNOSIS — I471 Supraventricular tachycardia: Secondary | ICD-10-CM | POA: Diagnosis not present

## 2018-08-05 DIAGNOSIS — Z7982 Long term (current) use of aspirin: Secondary | ICD-10-CM | POA: Diagnosis not present

## 2018-08-05 DIAGNOSIS — I4891 Unspecified atrial fibrillation: Secondary | ICD-10-CM | POA: Diagnosis not present

## 2018-08-05 DIAGNOSIS — K113 Abscess of salivary gland: Secondary | ICD-10-CM | POA: Diagnosis present

## 2018-08-05 DIAGNOSIS — Z87891 Personal history of nicotine dependence: Secondary | ICD-10-CM | POA: Diagnosis not present

## 2018-08-05 DIAGNOSIS — K116 Mucocele of salivary gland: Secondary | ICD-10-CM | POA: Diagnosis not present

## 2018-08-05 DIAGNOSIS — E669 Obesity, unspecified: Secondary | ICD-10-CM | POA: Diagnosis not present

## 2018-08-05 DIAGNOSIS — R002 Palpitations: Secondary | ICD-10-CM | POA: Diagnosis not present

## 2018-08-05 LAB — CBC
HCT: 44.2 % (ref 39.0–52.0)
Hemoglobin: 15.2 g/dL (ref 13.0–17.0)
MCH: 33.3 pg (ref 26.0–34.0)
MCHC: 34.4 g/dL (ref 30.0–36.0)
MCV: 96.7 fL (ref 80.0–100.0)
Platelets: 133 10*3/uL — ABNORMAL LOW (ref 150–400)
RBC: 4.57 MIL/uL (ref 4.22–5.81)
RDW: 12.1 % (ref 11.5–15.5)
WBC: 6.5 10*3/uL (ref 4.0–10.5)
nRBC: 0 % (ref 0.0–0.2)

## 2018-08-05 LAB — BASIC METABOLIC PANEL
Anion gap: 8 (ref 5–15)
BUN: 14 mg/dL (ref 8–23)
CO2: 26 mmol/L (ref 22–32)
Calcium: 8.6 mg/dL — ABNORMAL LOW (ref 8.9–10.3)
Chloride: 107 mmol/L (ref 98–111)
Creatinine, Ser: 1.23 mg/dL (ref 0.61–1.24)
GFR calc Af Amer: >60 mL/min (ref >60)
GFR calc non Af Amer: 60 mL/min — ABNORMAL LOW (ref >60)
Glucose, Bld: 96 mg/dL (ref 70–99)
Potassium: 3.7 mmol/L (ref 3.5–5.1)
Sodium: 141 mmol/L (ref 135–145)

## 2018-08-05 NOTE — Progress Notes (Signed)
PCP - Dr. Monico Blitz Cardiologist - Dr. Cristopher Peru  Chest x-ray - denies EKG - 07/04/2018 Stress Test - 2009 ECHO - 07/04/2018 Cardiac Cath - denies  Sleep Study - denies CPAP - N/a  Blood Thinner Instructions: N/A Aspirin Instructions: LD 08/04/2018  Anesthesia review: YES, heart hx  Coronavirus Screening  Have you experienced the following symptoms:  Cough yes/no: No Fever (>100.58F)  yes/no: No Runny nose yes/no: No Sore throat yes/no: No Difficulty breathing/shortness of breath  yes/no: No  Have you or a family member traveled in the last 14 days and where? yes/no: No  If the patient indicates "YES" to the above questions, their PAT will be rescheduled to limit the exposure to others and, the surgeon will be notified. THE PATIENT WILL NEED TO BE ASYMPTOMATIC FOR 14 DAYS.   If the patient is not experiencing any of these symptoms, the PAT nurse will instruct them to NOT bring anyone with them to their appointment since they may have these symptoms or traveled as well.   Please remind your patients and families that hospital visitation restrictions are in effect and the importance of the restrictions.   Patient denies shortness of breath, fever, cough and chest pain at PAT appointment  Patient verbalized understanding of instructions that were given to them at the PAT appointment. Patient was also instructed that they will need to review over the PAT instructions again at home before surgery.

## 2018-08-06 LAB — SARS CORONAVIRUS 2 (TAT 6-24 HRS): SARS Coronavirus 2: NEGATIVE

## 2018-08-06 MED ORDER — DEXTROSE 5 % IV SOLN
3.0000 g | INTRAVENOUS | Status: AC
  Administered 2018-08-07: 09:00:00 3 g via INTRAVENOUS
  Filled 2018-08-06: qty 3

## 2018-08-06 NOTE — Anesthesia Preprocedure Evaluation (Addendum)
Anesthesia Evaluation  Patient identified by MRN, date of birth, ID band Patient awake    Reviewed: Allergy & Precautions, NPO status , Patient's Chart, lab work & pertinent test results  Airway Mallampati: II  TM Distance: >3 FB     Dental  (+) Dental Advisory Given   Pulmonary former smoker,    breath sounds clear to auscultation       Cardiovascular hypertension, Pt. on medications and Pt. on home beta blockers + dysrhythmias  Rhythm:Regular Rate:Normal     Neuro/Psych negative neurological ROS     GI/Hepatic negative GI ROS, Neg liver ROS,   Endo/Other  Hypothyroidism   Renal/GU negative Renal ROS     Musculoskeletal   Abdominal   Peds  Hematology negative hematology ROS (+)   Anesthesia Other Findings   Reproductive/Obstetrics                           Lab Results  Component Value Date   WBC 6.5 08/05/2018   HGB 15.2 08/05/2018   HCT 44.2 08/05/2018   MCV 96.7 08/05/2018   PLT 133 (L) 08/05/2018   Lab Results  Component Value Date   CREATININE 1.23 08/05/2018   BUN 14 08/05/2018   NA 141 08/05/2018   K 3.7 08/05/2018   CL 107 08/05/2018   CO2 26 08/05/2018    Anesthesia Physical Anesthesia Plan  ASA: III  Anesthesia Plan: General   Post-op Pain Management:    Induction: Intravenous  PONV Risk Score and Plan: 2 and Dexamethasone, Ondansetron and Treatment may vary due to age or medical condition  Airway Management Planned: Oral ETT  Additional Equipment: None  Intra-op Plan:   Post-operative Plan: Extubation in OR  Informed Consent: I have reviewed the patients History and Physical, chart, labs and discussed the procedure including the risks, benefits and alternatives for the proposed anesthesia with the patient or authorized representative who has indicated his/her understanding and acceptance.     Dental advisory given  Plan Discussed with:  CRNA  Anesthesia Plan Comments: ( )      Anesthesia Quick Evaluation

## 2018-08-06 NOTE — Progress Notes (Signed)
Anesthesia Chart Review:  Case: [id number] Date/Time: 08/07/18 0815   Procedure: PAROTIDECTOMY (Right )   Anesthesia type: General   Pre-op diagnosis: Mass of right parotid gland   Location: [facility] OR ROOM 07 / [facility]   Surgeon: Jerrell Belfast, MD      DISCUSSION: Patient is a 68 year old male scheduled for the above procedure.  History includes former smoker, HTN, palpitations, afib/SVT (diagnosed ~ 2009), hypothyroidism, BPH (s/p implantation of UroLift devices x4 08/13/17). BMI is consistent with obesity.  Last seen by cardiologist Dr. Lovena Le on 07/04/18 with worsening palpitations and fatigue. No chest pain. Labs, echo, and Ziopatch ordered which patient has completed. TSH and H/H WNL, Cr stable at 1.22. Normal LVEF and no sustained arrhythmias. No mention of any additional pending tests.   Last ASA 08/04/18. Negative presurgical COVID test on 08/05/18. Discussed with anesthesiologist Tamela Gammon, MD. If no new/progressive symptoms then it is anticipated that he can proceed as planned.   VS: BP 115/65   Pulse 73   Temp 36.5 C   Resp 20   Ht 5\' 10"  (1.778 m)   Wt 121.7 kg   SpO2 95%   BMI 38.50 kg/m    PROVIDERS: Monico Blitz, MD is PCP Cristopher Peru, MD is EP cardiologist   LABS: Labs reviewed: Acceptable for surgery. 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 Tissue  sampling may be warranted. See discussion above.   EKG: 07/04/18: SR with first degree AV block   CV: ZioPatch monitor 07/08/18-07/22/18: Study Highlights: 1. NSR with sinus bradycardia and sinus tachycardia 2. NS atrial tachy 3. NS Ventricular tachycardia 4. Nocturnal bradycardia   Echo 07/24/18: IMPRESSIONS  1. The left ventricle has normal systolic function with an ejection fraction of 60-65%. The cavity size was normal. There is mildly increased left ventricular wall thickness. Left ventricular diastolic Doppler parameters are consistent with impaired  relaxation.  2. The right ventricle has normal systolic function. The cavity was normal.  3. The aortic valve is tricuspid. Mild thickening of the aortic valve. No stenosis of the aortic valve.  4. Technically difficult; definity used; normal LV systolic function; mild LVH; mild diastolic dysfunction.  5. The mitral valve is grossly normal.   Carotid US 07/18/17: IMPRESSION: Color duplex indicates minimal homogeneous plaque, with no hemodynamically significant stenosis by duplex criteria in the extracranial cerebrovascular circulation.   Nuclear stress test 08/25/15:  There was no ST segment deviation noted during stress.  Findings consistent with mild inferoapcal ischemia, cannot rule out slight differences in apical thinning as cause of defect. Either finding is low risk.  The left ventricular ejection fraction is hyperdynamic (>65%).  This is a low risk study. - Dr. Lovena Le reviewed and did not recommend any additional testing.   Past Medical History:  Diagnosis Date  . AF (atrial  fibrillation) (HCC)   . Anxiety and depression   . Hypertension   . Hypothyroidism   . Obesity   . Palpitations   . SVT (supraventricular tachycardia) (HCC)     Past Surgical History:  Procedure Laterality Date  . CARDIOVASCULAR STRESS TEST  09/05/2007  . CHOLECYSTECTOMY    . CYSTOSCOPY WITH INSERTION OF UROLIFT N/A 08/13/2017    Procedure: CYSTOSCOPY WITH INSERTION OF UROLIFT;  Surgeon: Malen GauzeMcKenzie, Patrick L, MD;  Location: [facility];  Service: Urology;  Laterality: N/A;  . TONSILLECTOMY      MEDICATIONS: . ALPRAZolam (XANAX) 1 MG tablet  . aspirin EC 81 MG tablet  . furosemide (LASIX) 40 MG tablet  . levothyroxine (SYNTHROID, LEVOTHROID) 125 MCG tablet  . losartan (COZAAR) 25 MG tablet  . metoprolol tartrate (LOPRESSOR) 25 MG tablet  . nitroGLYCERIN (NITROSTAT) 0.4 MG SL tablet  . Omega-3 Krill Oil 1000 MG CAPS  . PARoxetine (PAXIL) 20 MG tablet  . potassium chloride (K-DUR,KLOR-CON) 10 MEQ tablet  . tamsulosin (FLOMAX) 0.4 MG CAPS capsule   No current facility-administered medications for this encounter.    Melene Muller. [START ON 08/07/2018] ceFAZolin (ANCEF) 3 g in dextrose 5 % 50 mL IVPB    Shonna ChockAllison Chelli Yerkes, PA-C Surgical Short Stay/Anesthesiology [facility] Phone [phone] [facility] Phone [phone] 08/06/2018 10:06 AM

## 2018-08-07 ENCOUNTER — Encounter (HOSPITAL_COMMUNITY)
Admission: RE | Disposition: A | Discharge status: Home / Self Care | Payer: Self-pay | Source: Home / Self Care | Attending: Otolaryngology

## 2018-08-07 ENCOUNTER — Other Ambulatory Visit: Payer: Self-pay

## 2018-08-07 ENCOUNTER — Ambulatory Visit (HOSPITAL_COMMUNITY): Payer: Medicare Other | Admitting: Vascular Surgery

## 2018-08-07 ENCOUNTER — Ambulatory Visit (HOSPITAL_COMMUNITY): Payer: Medicare Other | Admitting: Certified Registered Nurse Anesthetist

## 2018-08-07 ENCOUNTER — Ambulatory Visit (HOSPITAL_COMMUNITY)
Admission: RE | Admit: 2018-08-07 | Discharge: 2018-08-08 | Disposition: A | Discharge status: Home / Self Care | Payer: Medicare Other | Attending: Otolaryngology | Admitting: Otolaryngology

## 2018-08-07 ENCOUNTER — Encounter (HOSPITAL_COMMUNITY): Payer: Self-pay | Admitting: *Deleted

## 2018-08-07 DIAGNOSIS — Z1159 Encounter for screening for other viral diseases: Secondary | ICD-10-CM | POA: Insufficient documentation

## 2018-08-07 DIAGNOSIS — I4891 Unspecified atrial fibrillation: Secondary | ICD-10-CM | POA: Diagnosis not present

## 2018-08-07 DIAGNOSIS — R002 Palpitations: Secondary | ICD-10-CM | POA: Insufficient documentation

## 2018-08-07 DIAGNOSIS — K116 Mucocele of salivary gland: Secondary | ICD-10-CM | POA: Insufficient documentation

## 2018-08-07 DIAGNOSIS — E669 Obesity, unspecified: Secondary | ICD-10-CM | POA: Insufficient documentation

## 2018-08-07 DIAGNOSIS — Z7982 Long term (current) use of aspirin: Secondary | ICD-10-CM | POA: Insufficient documentation

## 2018-08-07 DIAGNOSIS — Z6838 Body mass index (BMI) 38.0-38.9, adult: Secondary | ICD-10-CM | POA: Insufficient documentation

## 2018-08-07 DIAGNOSIS — Z79899 Other long term (current) drug therapy: Secondary | ICD-10-CM | POA: Insufficient documentation

## 2018-08-07 DIAGNOSIS — F329 Major depressive disorder, single episode, unspecified: Secondary | ICD-10-CM | POA: Insufficient documentation

## 2018-08-07 DIAGNOSIS — Z79890 Hormone replacement therapy (postmenopausal): Secondary | ICD-10-CM | POA: Insufficient documentation

## 2018-08-07 DIAGNOSIS — E039 Hypothyroidism, unspecified: Secondary | ICD-10-CM | POA: Insufficient documentation

## 2018-08-07 DIAGNOSIS — F419 Anxiety disorder, unspecified: Secondary | ICD-10-CM | POA: Insufficient documentation

## 2018-08-07 DIAGNOSIS — I10 Essential (primary) hypertension: Secondary | ICD-10-CM | POA: Insufficient documentation

## 2018-08-07 DIAGNOSIS — Z888 Allergy status to other drugs, medicaments and biological substances status: Secondary | ICD-10-CM | POA: Insufficient documentation

## 2018-08-07 DIAGNOSIS — I471 Supraventricular tachycardia: Secondary | ICD-10-CM | POA: Insufficient documentation

## 2018-08-07 DIAGNOSIS — K118 Other diseases of salivary glands: Secondary | ICD-10-CM | POA: Diagnosis present

## 2018-08-07 DIAGNOSIS — Z87891 Personal history of nicotine dependence: Secondary | ICD-10-CM | POA: Insufficient documentation

## 2018-08-07 HISTORY — PX: PAROTIDECTOMY: SHX2163

## 2018-08-07 HISTORY — PX: PAROTIDECTOMY: SUR1003

## 2018-08-07 LAB — CREATININE, SERUM
Creatinine, Ser: 1.34 mg/dL — ABNORMAL HIGH (ref 0.61–1.24)
GFR calc Af Amer: >60 mL/min (ref >60)
GFR calc non Af Amer: 54 mL/min — ABNORMAL LOW (ref >60)

## 2018-08-07 LAB — CBC
HCT: 43.7 % (ref 39.0–52.0)
Hemoglobin: 15 g/dL (ref 13.0–17.0)
MCH: 33.3 pg (ref 26.0–34.0)
MCHC: 34.3 g/dL (ref 30.0–36.0)
MCV: 96.9 fL (ref 80.0–100.0)
Platelets: 121 10*3/uL — ABNORMAL LOW (ref 150–400)
RBC: 4.51 MIL/uL (ref 4.22–5.81)
RDW: 12.1 % (ref 11.5–15.5)
WBC: 7.8 10*3/uL (ref 4.0–10.5)
nRBC: 0 % (ref 0.0–0.2)

## 2018-08-07 SURGERY — EXCISION, PAROTID GLAND
Anesthesia: General | Laterality: Right

## 2018-08-07 MED ORDER — DEXAMETHASONE SODIUM PHOSPHATE 10 MG/ML IJ SOLN
INTRAMUSCULAR | Status: DC | PRN
  Administered 2018-08-07: 10 mg via INTRAVENOUS

## 2018-08-07 MED ORDER — KETOROLAC TROMETHAMINE 0.5 % OP SOLN
OPHTHALMIC | Status: AC
  Filled 2018-08-07: qty 5

## 2018-08-07 MED ORDER — ONDANSETRON HCL 4 MG/2ML IJ SOLN: 4.0000 mg | Freq: Once | INTRAMUSCULAR | Status: DC | PRN

## 2018-08-07 MED ORDER — ONDANSETRON HCL 4 MG/2ML IJ SOLN
INTRAMUSCULAR | Status: AC
  Filled 2018-08-07: qty 2

## 2018-08-07 MED ORDER — DEXAMETHASONE SODIUM PHOSPHATE 10 MG/ML IJ SOLN: 10.0000 mg | Freq: Once | INTRAMUSCULAR | Status: DC

## 2018-08-07 MED ORDER — FENTANYL CITRATE (PF) 100 MCG/2ML IJ SOLN
INTRAMUSCULAR | Status: AC
  Filled 2018-08-07: qty 2

## 2018-08-07 MED ORDER — PROPOFOL 10 MG/ML IV BOLUS
INTRAVENOUS | Status: AC
  Filled 2018-08-07: qty 40

## 2018-08-07 MED ORDER — DEXTROSE IN LACTATED RINGERS 5 % IV SOLN
INTRAVENOUS | Status: DC
  Administered 2018-08-07 – 2018-08-08 (×2): via INTRAVENOUS

## 2018-08-07 MED ORDER — 0.9 % SODIUM CHLORIDE (POUR BTL) OPTIME
TOPICAL | Status: DC | PRN
  Administered 2018-08-07: 08:00:00 1000 mL

## 2018-08-07 MED ORDER — FUROSEMIDE 40 MG PO TABS
40.0000 mg | ORAL_TABLET | Freq: Two times a day (BID) | ORAL | Status: DC | PRN
  Administered 2018-08-08: 40 mg via ORAL
  Filled 2018-08-07: qty 1

## 2018-08-07 MED ORDER — NITROGLYCERIN 0.4 MG SL SUBL: 0.4000 mg | SUBLINGUAL_TABLET | SUBLINGUAL | Status: DC | PRN

## 2018-08-07 MED ORDER — LACTATED RINGERS IV SOLN
INTRAVENOUS | Status: DC | PRN
  Administered 2018-08-07 (×2): via INTRAVENOUS

## 2018-08-07 MED ORDER — MIDAZOLAM HCL 2 MG/2ML IJ SOLN
INTRAMUSCULAR | Status: DC | PRN
  Administered 2018-08-07: 2 mg via INTRAVENOUS

## 2018-08-07 MED ORDER — POLYMYXIN B-TRIMETHOPRIM 10000-0.1 UNIT/ML-% OP SOLN: 1.0000 [drp] | Freq: Three times a day (TID) | OPHTHALMIC | Status: DC

## 2018-08-07 MED ORDER — SODIUM CHLORIDE 0.9 % IV SOLN
0.0500 ug/kg/min | INTRAVENOUS | Status: DC
  Administered 2018-08-07: 09:00:00 0.15 ug/kg/min via INTRAVENOUS
  Filled 2018-08-07: qty 4000

## 2018-08-07 MED ORDER — KETOROLAC TROMETHAMINE 0.5 % OP SOLN
1.0000 [drp] | Freq: Three times a day (TID) | OPHTHALMIC | Status: DC | PRN
  Administered 2018-08-07: 1 [drp] via OPHTHALMIC

## 2018-08-07 MED ORDER — OMEGA-3 KRILL OIL 1000 MG PO CAPS: 1000.0000 mg | ORAL_CAPSULE | Freq: Every day | ORAL | Status: DC

## 2018-08-07 MED ORDER — FENTANYL CITRATE (PF) 100 MCG/2ML IJ SOLN
25.0000 ug | INTRAMUSCULAR | Status: DC | PRN
  Administered 2018-08-07: 25 ug via INTRAVENOUS

## 2018-08-07 MED ORDER — CHLORHEXIDINE GLUCONATE CLOTH 2 % EX PADS: 6.0000 | MEDICATED_PAD | Freq: Once | CUTANEOUS | Status: DC

## 2018-08-07 MED ORDER — MIDAZOLAM HCL 2 MG/2ML IJ SOLN
INTRAMUSCULAR | Status: AC
  Filled 2018-08-07: qty 2

## 2018-08-07 MED ORDER — DEXAMETHASONE SODIUM PHOSPHATE 10 MG/ML IJ SOLN
INTRAMUSCULAR | Status: AC
  Filled 2018-08-07: qty 1

## 2018-08-07 MED ORDER — OMEGA-3-ACID ETHYL ESTERS 1 G PO CAPS
1.0000 g | ORAL_CAPSULE | Freq: Every day | ORAL | Status: DC
  Administered 2018-08-08: 1 g via ORAL
  Filled 2018-08-07 (×3): qty 1

## 2018-08-07 MED ORDER — ONDANSETRON HCL 4 MG/2ML IJ SOLN
INTRAMUSCULAR | Status: DC | PRN
  Administered 2018-08-07: 4 mg via INTRAVENOUS

## 2018-08-07 MED ORDER — ONDANSETRON HCL 4 MG PO TABS: 4.0000 mg | ORAL_TABLET | ORAL | Status: DC | PRN

## 2018-08-07 MED ORDER — HYDROCODONE-ACETAMINOPHEN 7.5-325 MG/15ML PO SOLN
10.0000 mL | ORAL | Status: DC | PRN
  Administered 2018-08-07 – 2018-08-08 (×3): 15 mL via ORAL
  Filled 2018-08-07 (×3): qty 15

## 2018-08-07 MED ORDER — LIDOCAINE 2% (20 MG/ML) 5 ML SYRINGE
INTRAMUSCULAR | Status: AC
  Filled 2018-08-07: qty 5

## 2018-08-07 MED ORDER — SUCCINYLCHOLINE CHLORIDE 200 MG/10ML IV SOSY
PREFILLED_SYRINGE | INTRAVENOUS | Status: AC
  Filled 2018-08-07: qty 10

## 2018-08-07 MED ORDER — SODIUM CHLORIDE 0.9 % IV SOLN
INTRAVENOUS | Status: DC | PRN
  Administered 2018-08-07: 25 ug/min via INTRAVENOUS

## 2018-08-07 MED ORDER — BSS IO SOLN: 15.0000 mL | Freq: Once | INTRAOCULAR | Status: DC

## 2018-08-07 MED ORDER — MORPHINE SULFATE (PF) 2 MG/ML IV SOLN
2.0000 mg | INTRAVENOUS | Status: DC | PRN
  Administered 2018-08-07: 3 mg via INTRAVENOUS
  Filled 2018-08-07: qty 2

## 2018-08-07 MED ORDER — SUCCINYLCHOLINE CHLORIDE 200 MG/10ML IV SOSY
PREFILLED_SYRINGE | INTRAVENOUS | Status: DC | PRN
  Administered 2018-08-07: 120 mg via INTRAVENOUS

## 2018-08-07 MED ORDER — TAMSULOSIN HCL 0.4 MG PO CAPS
0.4000 mg | ORAL_CAPSULE | Freq: Every day | ORAL | Status: DC
  Administered 2018-08-08: 0.4 mg via ORAL
  Filled 2018-08-07: qty 1

## 2018-08-07 MED ORDER — FENTANYL CITRATE (PF) 250 MCG/5ML IJ SOLN
INTRAMUSCULAR | Status: AC
  Filled 2018-08-07: qty 5

## 2018-08-07 MED ORDER — POTASSIUM CHLORIDE CRYS ER 10 MEQ PO TBCR
10.0000 meq | EXTENDED_RELEASE_TABLET | Freq: Every day | ORAL | Status: DC | PRN
  Administered 2018-08-08: 10 meq via ORAL
  Filled 2018-08-07: qty 1

## 2018-08-07 MED ORDER — FENTANYL CITRATE (PF) 250 MCG/5ML IJ SOLN
INTRAMUSCULAR | Status: DC | PRN
  Administered 2018-08-07: 50 ug via INTRAVENOUS
  Administered 2018-08-07: 100 ug via INTRAVENOUS

## 2018-08-07 MED ORDER — LOSARTAN POTASSIUM 25 MG PO TABS: 12.5000 mg | ORAL_TABLET | Freq: Every day | ORAL | Status: DC | PRN

## 2018-08-07 MED ORDER — ONDANSETRON HCL 4 MG/2ML IJ SOLN: 4.0000 mg | INTRAMUSCULAR | Status: DC | PRN

## 2018-08-07 MED ORDER — PAROXETINE HCL 10 MG PO TABS
10.0000 mg | ORAL_TABLET | Freq: Every day | ORAL | Status: DC
  Administered 2018-08-08: 10 mg via ORAL
  Filled 2018-08-07 (×2): qty 1

## 2018-08-07 MED ORDER — LIDOCAINE-EPINEPHRINE 1 %-1:100000 IJ SOLN
INTRAMUSCULAR | Status: DC | PRN
  Administered 2018-08-07: 6 mL via INTRADERMAL

## 2018-08-07 MED ORDER — METOPROLOL TARTRATE 12.5 MG HALF TABLET: 12.5000 mg | ORAL_TABLET | Freq: Every day | ORAL | Status: DC | PRN

## 2018-08-07 MED ORDER — KETOROLAC TROMETHAMINE 0.5 % OP SOLN
1.0000 [drp] | Freq: Four times a day (QID) | OPHTHALMIC | Status: AC
  Administered 2018-08-07 – 2018-08-08 (×4): 1 [drp] via OPHTHALMIC
  Filled 2018-08-07: qty 5

## 2018-08-07 MED ORDER — IBUPROFEN 100 MG/5ML PO SUSP
400.0000 mg | Freq: Four times a day (QID) | ORAL | Status: DC | PRN
  Filled 2018-08-07: qty 20

## 2018-08-07 MED ORDER — LEVOTHYROXINE SODIUM 25 MCG PO TABS
125.0000 ug | ORAL_TABLET | Freq: Every day | ORAL | Status: DC
  Administered 2018-08-08: 125 ug via ORAL
  Filled 2018-08-07: qty 1

## 2018-08-07 MED ORDER — LIDOCAINE 2% (20 MG/ML) 5 ML SYRINGE
INTRAMUSCULAR | Status: DC | PRN
  Administered 2018-08-07: 60 mg via INTRAVENOUS

## 2018-08-07 MED ORDER — HEPARIN SODIUM (PORCINE) 5000 UNIT/ML IJ SOLN
5000.0000 [IU] | Freq: Three times a day (TID) | INTRAMUSCULAR | Status: DC
  Administered 2018-08-07 – 2018-08-08 (×3): 5000 [IU] via SUBCUTANEOUS
  Filled 2018-08-07 (×3): qty 1

## 2018-08-07 MED ORDER — ALPRAZOLAM 0.5 MG PO TABS
1.0000 mg | ORAL_TABLET | Freq: Three times a day (TID) | ORAL | Status: DC
  Administered 2018-08-07 – 2018-08-08 (×3): 1 mg via ORAL
  Filled 2018-08-07 (×3): qty 2

## 2018-08-07 MED ORDER — PROPOFOL 10 MG/ML IV BOLUS
INTRAVENOUS | Status: DC | PRN
  Administered 2018-08-07: 250 mg via INTRAVENOUS

## 2018-08-07 MED ORDER — LIDOCAINE-EPINEPHRINE 1 %-1:100000 IJ SOLN
INTRAMUSCULAR | Status: AC
  Filled 2018-08-07: qty 1

## 2018-08-07 SURGICAL SUPPLY — 53 items
ATTRACTOMAT 16X20 MAGNETIC DRP (DRAPES) IMPLANT
BLADE CLIPPER SURG (BLADE) IMPLANT
BLADE SURG 15 STRL LF DISP TIS (BLADE) IMPLANT
BLADE SURG 15 STRL SS (BLADE)
CABLE BIPOLOR RESECTION CORD (MISCELLANEOUS) ×3 IMPLANT
CANISTER SUCT 3000ML PPV (MISCELLANEOUS) ×3 IMPLANT
CLEANER TIP ELECTROSURG 2X2 (MISCELLANEOUS) ×3 IMPLANT
CONT SPEC 4OZ CLIKSEAL STRL BL (MISCELLANEOUS) ×3 IMPLANT
COVER SURGICAL LIGHT HANDLE (MISCELLANEOUS) ×3 IMPLANT
COVER WAND RF STERILE (DRAPES) ×3 IMPLANT
DERMABOND ADHESIVE PROPEN (GAUZE/BANDAGES/DRESSINGS) ×2
DERMABOND ADVANCED (GAUZE/BANDAGES/DRESSINGS) ×2
DERMABOND ADVANCED .7 DNX12 (GAUZE/BANDAGES/DRESSINGS) ×1 IMPLANT
DERMABOND ADVANCED .7 DNX6 (GAUZE/BANDAGES/DRESSINGS) IMPLANT
DRAIN JACKSON RD 7FR 3/32 (WOUND CARE) IMPLANT
DRAIN PENROSE 1/4X12 LTX STRL (WOUND CARE) IMPLANT
DRAIN SNY 10 ROU (WOUND CARE) IMPLANT
DRAPE HALF SHEET 40X57 (DRAPES) IMPLANT
DRAPE SURG 17X23 STRL (DRAPES) ×3 IMPLANT
DRSG TEGADERM 2-3/8X2-3/4 SM (GAUZE/BANDAGES/DRESSINGS) ×15 IMPLANT
ELECT COATED BLADE 2.86 ST (ELECTRODE) ×3 IMPLANT
ELECT PAIRED SUBDERMAL (MISCELLANEOUS) ×3
ELECT REM PT RETURN 9FT ADLT (ELECTROSURGICAL) ×3
ELECTRODE PAIRED SUBDERMAL (MISCELLANEOUS) IMPLANT
ELECTRODE REM PT RTRN 9FT ADLT (ELECTROSURGICAL) ×1 IMPLANT
EVACUATOR SILICONE 100CC (DRAIN) IMPLANT
FORCEPS BIPOLAR SPETZLER 8 1.0 (NEUROSURGERY SUPPLIES) ×2 IMPLANT
GAUZE 4X4 16PLY RFD (DISPOSABLE) ×3 IMPLANT
GLOVE BIOGEL M 7.0 STRL (GLOVE) ×3 IMPLANT
GOWN STRL REUS W/ TWL LRG LVL3 (GOWN DISPOSABLE) ×2 IMPLANT
GOWN STRL REUS W/TWL LRG LVL3 (GOWN DISPOSABLE) ×4
KIT BASIN OR (CUSTOM PROCEDURE TRAY) ×3 IMPLANT
KIT TURNOVER KIT B (KITS) ×3 IMPLANT
NDL HYPO 25GX1X1/2 BEV (NEEDLE) IMPLANT
NEEDLE HYPO 25GX1X1/2 BEV (NEEDLE) IMPLANT
NS IRRIG 1000ML POUR BTL (IV SOLUTION) ×3 IMPLANT
PAD ARMBOARD 7.5X6 YLW CONV (MISCELLANEOUS) ×6 IMPLANT
PENCIL BUTTON HOLSTER BLD 10FT (ELECTRODE) ×3 IMPLANT
PROBE NERVBE PRASS .33 (MISCELLANEOUS) ×3 IMPLANT
SHEARS HARMONIC 9CM CVD (BLADE) ×3 IMPLANT
SPONGE INTESTINAL PEANUT (DISPOSABLE) IMPLANT
STAPLER VISISTAT 35W (STAPLE) ×3 IMPLANT
SUT ETHILON 3 0 PS 1 (SUTURE) IMPLANT
SUT ETHILON 5 0 P 3 18 (SUTURE)
SUT ETHILON 6 0 P 1 (SUTURE) IMPLANT
SUT NYLON ETHILON 5-0 P-3 1X18 (SUTURE) IMPLANT
SUT SILK 2 0 PERMA HAND 18 BK (SUTURE) ×8 IMPLANT
SUT SILK 2 0 REEL (SUTURE) ×3 IMPLANT
SUT SILK 3 0 REEL (SUTURE) ×3 IMPLANT
SUT VIC AB 5-0 P-3 18XBRD (SUTURE) ×1 IMPLANT
SUT VIC AB 5-0 P3 18 (SUTURE) ×2
SUT VICRYL 4-0 PS2 18IN ABS (SUTURE) ×3 IMPLANT
TRAY ENT MC OR (CUSTOM PROCEDURE TRAY) ×3 IMPLANT

## 2018-08-07 NOTE — H&P (Signed)
Justin [name] is an 68 y.o. male.   Chief Complaint: Right Parotid Mass HPI:  Right Parotid Mass pain or erythema  Past Medical History:  Diagnosis Date  . AF (atrial fibrillation) ([place])   . Anxiety and depression   . Hypertension   . Hypothyroidism   . Obesity   . Palpitations   . SVT (supraventricular tachycardia) (HCC)     Past Surgical History:  Procedure Laterality Date  . CARDIOVASCULAR STRESS TEST  09/05/2007  . CHOLECYSTECTOMY    . CYSTOSCOPY WITH INSERTION OF UROLIFT N/A 08/13/2017   Procedure: CYSTOSCOPY WITH INSERTION OF UROLIFT;  Surgeon: Cleon Gustin, MD;  Location: [facility];  Service: Urology;  Laterality: N/A;  . TONSILLECTOMY      Family History  Problem Relation Age of Onset  . Heart disease Mother   . Heart disease Father    Social History:  reports that he has quit smoking. He has quit using smokeless tobacco.  His smokeless tobacco use included snuff. He reports that he does not drink alcohol or use drugs.  Allergies:  Allergies  Allergen Reactions  . Halcion [Triazolam] Other (See Comments)    Broke the hospital bed    Medications Prior to Admission  Medication Sig Dispense Refill  . ALPRAZolam (XANAX) 1 MG tablet Take 1 mg by mouth 3 (three) times daily.     Marland Kitchen aspirin EC 81 MG tablet Take 81 mg by mouth daily.    . furosemide (LASIX) 40 MG tablet Take 40 mg by mouth 2 (two) times daily as needed (swelling feet/fluid retention.).     Marland Kitchen levothyroxine (SYNTHROID, LEVOTHROID) 125 MCG tablet Take 125 mcg by mouth daily before breakfast.   7  . losartan (COZAAR) 25 MG tablet Take 12.5 mg by mouth daily as needed (for blood pressure greater 140/70).     . nitroGLYCERIN (NITROSTAT) 0.4 MG SL tablet Place 1 tablet (0.4 mg total) under the tongue every 5 (five) minutes as needed for chest pain. 25 tablet 3  . Omega-3 Krill Oil 1000 MG CAPS Take 1,000 mg by mouth daily.    Marland Kitchen PARoxetine (PAXIL) 20 MG tablet Take 10 mg by mouth daily.     . potassium  chloride (K-DUR,KLOR-CON) 10 MEQ tablet TAKE 1 TABLET BY MOUTH EVERY DAY (Patient taking differently: Take 10 mEq by mouth daily as needed (with lasix (swelling/retention)). ) 90 tablet 1  . tamsulosin (FLOMAX) 0.4 MG CAPS capsule Take 0.4 mg by mouth daily.    . metoprolol tartrate (LOPRESSOR) 25 MG tablet Take 12.5 mg by mouth daily as needed (tachycardia).      Results for orders placed or performed during the hospital encounter of 08/05/18 (from the past 48 hour(s))  SARS Coronavirus 2 (Performed in [facility] hospital lab)     Status: None   Collection Time: 08/05/18  2:14 PM   Specimen: Nasal Swab  Result Value Ref Range   SARS Coronavirus 2 NEGATIVE NEGATIVE    Comment: (NOTE) SARS-CoV-2 target nucleic acids are NOT DETECTED. The SARS-CoV-2 RNA is generally detectable in upper and lower respiratory specimens during the acute phase of infection. Negative results do not preclude SARS-CoV-2 infection, do not rule out co-infections with other pathogens, and should not be used as the sole basis for treatment or other patient management decisions. Negative results must be combined with clinical observations, patient history, and epidemiological information. The expected result is Negative. Fact Sheet for Patients: SugarRoll.be Fact Sheet for Healthcare Providers: https://www.woods-mathews.com/ This test  is not yet approved or cleared by the [place] FDA and  has been authorized for detection and/or diagnosis of SARS-CoV-2 by FDA under an Emergency Use Authorization (EUA). This EUA will remain  in effect (meaning this test can be used) for the duration of the COVID-19 declaration under Section 56 4(b)(1) of the Act, 21 U.S.C. section 360bbb-3(b)(1), unless the authorization is terminated or revoked sooner. Performed at [facility], [address] [place], [place] [address]    No results found.  Review of Systems   Constitutional: Negative.   HENT: Negative.   Respiratory: Negative.   Cardiovascular: Negative.     Blood pressure 129/61, pulse (!) 52, temperature 98.1 F (36.7 C), temperature source Oral, resp. rate 18, SpO2 93 %. Physical Exam  Constitutional: He appears well-developed and well-nourished.  Neck:  Right parotid mass  Cardiovascular: Normal rate.  Respiratory: Effort normal.     Assessment/Plan Adm for OP Rt parotidectomy with NIMS under GA with O/N obs  Osborn Cohoavid Ksean Vale, MD 08/07/2018, 8:30 AM

## 2018-08-07 NOTE — Op Note (Signed)
Operative Note: PAROTIDECTOMY  Patient: Justin [name]  Medical record number: [id number]  Date:08/07/2018  Pre-operative Indications: Right Parotid Mass  Postoperative Indications: Same  Surgical Procedure:  Right Superficial Parotidectomy with NIMS Monitoring  Anesthesia: GET  Surgeon: Delsa Bern, M.D.  Assist: Dr. Redmond Baseman  Complications: None  EBL: 100cc  Note: Surgical assistant Dr. Melida Quitter was present throughout the surgical procedure, his expert assistance was required throughout a difficult surgery and included retraction, cautery and surgical advice during the procedure.   Brief History: The patient is a 68 y.o. male with a history of right parotid mass.  The patient underwent CT scan which showed a 2 x 2 and 1/2 cm soft tissue mass within the parotid consistent with possible Warthin's tumor. Given the patient's history and findings I recommended right superficial parotidectomy with nerve monitoring under general anesthesia.  The  risks and benefits were discussed in detail with the patient and their family. They understand and agree with our plan for surgery which is scheduled at [facility] on an elective basis.  Surgical Procedure: The patient is brought to the operating room on 08/07/2018 and placed in supine position on the operating table. General endotracheal anesthesia was established without difficulty. When the patient was adequately anesthetized, surgical timeout was performed and correct identification of the patient and the surgical procedure.  The patient was injected with 6 cc of 1% lidocaine 1-100,000 dilution epinephrine.  The Xomed Nerve Integrity Monitoring System (NIMS) was placed and nerve monitoring was used throughout the facial nerve dissection component of the surgical procedure.  The patient was positioned and prepped and draped in sterile fashion.  With the patient prepped and positioned, right superficial parotidectomy was undertaken.  A  curvilinear incision was created in the preauricular skin crease and carried inferiorly around the earlobe and into the upper neck in a pre-existing skin crease using a #15 scalpel.  Subcutaneous soft tissue was then dissected and incised with Bovie electrocautery to the level of the superficial parotid fascia.  Dissection was carried along the superficial fascia from posterior to anterior elevating skin and muscle layer.  Dissection was then carried along the tragal cartilage from superficial to deep elevating the parotid gland anteriorly.  The inferior aspect of the incision was then dissected carefully, the anterior aspect of the sternocleidomastoid muscle was dissected and the parotid gland was reflected anteriorly.  The main trunk of the facial nerve was then dissected, the superior and inferior divisions of the facial nerve were then followed from proximal to distal identified preserving each of the nerve branches.  The NIMS monitor was used throughout this component of the surgical procedure.  The parotid mass was identified and dissected free from the surrounding normal-appearing parotid tissue using harmonic scalpel and bipolar cautery.  The parotid specimen was then completely resected and sent to pathology for gross microscopic evaluation.  The facial nerve was then stimulated using the NIMS probe and all branches stimulated appropriately at 0.5 mV.  The parotid bed was then irrigated, hemostasis was maintained with bipolar cautery.  A 7 French round drain was then sutured to the skin with 4-0 Ethilon suture and placed in the parotid bed through the parotid skin incision.  The patient's incision was then closed in multiple layers beginning with reapproximation of the periparotid fascia using interrupted 4-0 Vicryl.  The immediate subcutaneous tissue was closed with interrupted 5-0 Vicryl suture.  The final skin closure was obtained with Dermabond surgical glue.  An orogastric tube was  passed and stomach  contents were aspirated. Patient was awakened from anesthetic and transferred from the operating room to the recovery room in stable condition. There were no complications and blood loss was minimal.   Barbee Coughavid L Shakur Lembo, M.D. [facility]

## 2018-08-07 NOTE — Anesthesia Procedure Notes (Signed)
Procedure Name: Intubation Date/Time: 08/07/2018 8:55 AM Performed by: Kathryne Hitch, CRNA Pre-anesthesia Checklist: Patient identified, Emergency Drugs available, Suction available and Patient being monitored Patient Re-evaluated:Patient Re-evaluated prior to induction Oxygen Delivery Method: Circle system utilized Preoxygenation: Pre-oxygenation with 100% oxygen Induction Type: IV induction Ventilation: Mask ventilation without difficulty Laryngoscope Size: Miller and 2 Grade View: Grade I Tube type: Oral Tube size: 7.5 mm Number of attempts: 1 Airway Equipment and Method: Stylet and Oral airway Placement Confirmation: ETT inserted through vocal cords under direct vision,  positive ETCO2 and breath sounds checked- equal and bilateral Secured at: 22 cm Tube secured with: Tape Dental Injury: Teeth and Oropharynx as per pre-operative assessment

## 2018-08-07 NOTE — Transfer of Care (Signed)
Immediate Anesthesia Transfer of Care Note  Patient: Justin [name]  Procedure(s) Performed: PAROTIDECTOMY (Right )  Patient Location: PACU  Anesthesia Type:General  Level of Consciousness: drowsy and patient cooperative  Airway & Oxygen Therapy: Patient Spontanous Breathing and Patient connected to face mask oxygen  Post-op Assessment: Report given to RN and Post -op Vital signs reviewed and stable  Post vital signs: Reviewed and stable  Last Vitals:  Vitals Value Taken Time  BP 132/79 08/07/18 1121  Temp    Pulse 80 08/07/18 1127  Resp 11 08/07/18 1127  SpO2 97 % 08/07/18 1127  Vitals shown include unvalidated device data.  Last Pain:  Vitals:   08/07/18 0719  TempSrc:   PainSc: 0-No pain      Patients Stated Pain Goal: 5 (24/40/10 2725)  Complications: No apparent anesthesia complications

## 2018-08-07 NOTE — Anesthesia Postprocedure Evaluation (Signed)
Anesthesia Post Note  Patient: Justin [name]  Procedure(s) Performed: PAROTIDECTOMY (Right )     Patient location during evaluation: PACU Anesthesia Type: General Level of consciousness: awake and alert Pain management: pain level controlled Vital Signs Assessment: post-procedure vital signs reviewed and stable Respiratory status: spontaneous breathing, nonlabored ventilation, respiratory function stable and patient connected to nasal cannula oxygen Cardiovascular status: blood pressure returned to baseline and stable Postop Assessment: no apparent nausea or vomiting Anesthetic complications: yes Anesthetic complication details: injury of cornea   Last Vitals:  Vitals:   08/07/18 1221 08/07/18 1244  BP: 126/69 124/67  Pulse: (!) 59 (!) 56  Resp: 14 14  Temp: (!) 36.1 C 36.5 C  SpO2: 93% 94%    Last Pain:  Vitals:   08/07/18 1305  TempSrc:   PainSc: 9                  Tiajuana Amass

## 2018-08-08 ENCOUNTER — Encounter (HOSPITAL_COMMUNITY): Payer: Self-pay | Admitting: Otolaryngology

## 2018-08-08 DIAGNOSIS — K116 Mucocele of salivary gland: Secondary | ICD-10-CM | POA: Diagnosis not present

## 2018-08-08 MED ORDER — HYDROCODONE-ACETAMINOPHEN 5-325 MG PO TABS: 1.0000 | ORAL_TABLET | Freq: Four times a day (QID) | ORAL | 0 refills | Status: AC | PRN

## 2018-08-08 NOTE — Progress Notes (Signed)
   ENT Progress Note: POD #1 s/p Procedure(s): PAROTIDECTOMY   Subjective: Min pain  Objective: Vital signs in last 24 hours: Temp:  [97 F (36.1 C)-98.1 F (36.7 C)] 97.5 F (36.4 C) (07/09 0520) Pulse Rate:  [46-92] 48 (07/09 0520) Resp:  [10-18] 18 (07/09 0520) BP: (107-134)/(61-79) 107/61 (07/09 0520) SpO2:  [92 %-98 %] 96 % (07/09 0520) Weight:  [122.8 kg] 122.8 kg (07/08 1244) Weight change:  Last BM Date: 08/06/18  Intake/Output from previous day: 07/08 0701 - 07/09 0700 In: 2363.3 [P.O.:240; I.V.:2123.3] Out: 1380 [Urine:925; Drains:405; Blood:50] Intake/Output this shift: No intake/output data recorded.  Labs: Recent Labs    08/05/18 1330 08/07/18 1439  WBC 6.5 7.8  HGB 15.2 15.0  HCT 44.2 43.7  PLT 133* 121*   Recent Labs    08/05/18 1330  NA 141  K 3.7  CL 107  CO2 26  GLUCOSE 96  BUN 14  CALCIUM 8.6*    Studies/Results: No results found.   PHYSICAL EXAM: Inc intact  No swelling or erythema  JP removed  Facial N. Stable    Assessment/Plan: Pt stable POD #1 D/c to home    Jerrell Belfast 08/08/2018, 9:31 AM

## 2018-08-08 NOTE — Discharge Summary (Signed)
Physician Discharge Summary  Patient ID: Justin [name] MRN: [id number] DOB/AGE: 1950-05-26 68 y.o.  Admit date: 08/07/2018 Discharge date: 08/08/2018  Admission Diagnoses:  Active Problems:   Parotid mass   Discharge Diagnoses:  Same  Surgeries: Procedure(s): PAROTIDECTOMY on 08/07/2018   Consultants: None  Discharged Condition: Improved  Hospital Course: Justin [name] is an 68 y.o. male who was admitted 08/07/2018 with a diagnosis of Active Problems:   Parotid mass  and went to the operating room on 08/07/2018 and underwent the above named procedures.   Stable POD #1. D/C to home.  Recent vital signs:  Vitals:   08/08/18 0150 08/08/18 0520  BP: 108/61 107/61  Pulse: (!) 46 (!) 48  Resp: 18 18  Temp: 98.1 F (36.7 C) (!) 97.5 F (36.4 C)  SpO2: 96% 96%    Recent laboratory studies:  Results for orders placed or performed during the hospital encounter of 08/07/18  CBC  Result Value Ref Range   WBC 7.8 4.0 - 10.5 K/uL   RBC 4.51 4.22 - 5.81 MIL/uL   Hemoglobin 15.0 13.0 - 17.0 g/dL   HCT 40.943.7 81.139.0 - 91.452.0 %   MCV 96.9 80.0 - 100.0 fL   MCH 33.3 26.0 - 34.0 pg   MCHC 34.3 30.0 - 36.0 g/dL   RDW 78.212.1 95.611.5 - 21.315.5 %   Platelets 121 (L) 150 - 400 K/uL   nRBC 0.0 0.0 - 0.2 %  Creatinine, serum  Result Value Ref Range   Creatinine, Ser 1.34 (H) 0.61 - 1.24 mg/dL   GFR calc non Af Amer 54 (L) >60 mL/min   GFR calc Af Amer >60 >60 mL/min    Discharge Medications:   Allergies as of 08/08/2018      Reactions   Halcion [triazolam] Other (See Comments)   Broke the hospital bed      Medication List    STOP taking these medications   aspirin EC 81 MG tablet     TAKE these medications   ALPRAZolam 1 MG tablet Commonly known as: XANAX Take 1 mg by mouth 3 (three) times daily.   furosemide 40 MG tablet Commonly known as: LASIX Take 40 mg by mouth 2 (two) times daily as needed (swelling feet/fluid retention.).   HYDROcodone-acetaminophen 5-325 MG  tablet Commonly known as: Norco Take 1-2 tablets by mouth every 6 (six) hours as needed for up to 5 days for moderate pain. Alternate OTC Motrin 600mg  and Tylenol 650mg  every 6 hours as needed for pain. May substitute Norco (Tylenol/narcotic) for plain Tylenol as needed.   levothyroxine 125 MCG tablet Commonly known as: SYNTHROID Take 125 mcg by mouth daily before breakfast.   losartan 25 MG tablet Commonly known as: COZAAR Take 12.5 mg by mouth daily as needed (for blood pressure greater 140/70).   metoprolol tartrate 25 MG tablet Commonly known as: LOPRESSOR Take 12.5 mg by mouth daily as needed (tachycardia).   nitroGLYCERIN 0.4 MG SL tablet Commonly known as: NITROSTAT Place 1 tablet (0.4 mg total) under the tongue every 5 (five) minutes as needed for chest pain.   Omega-3 Krill Oil 1000 MG Caps Take 1,000 mg by mouth daily.   PARoxetine 20 MG tablet Commonly known as: PAXIL Take 10 mg by mouth daily.   potassium chloride 10 MEQ tablet Commonly known as: K-DUR TAKE 1 TABLET BY MOUTH EVERY DAY What changed:   when to take this  reasons to take this   tamsulosin 0.4 MG Caps capsule Commonly known  as: FLOMAX Take 0.4 mg by mouth daily.       Diagnostic Studies: No results found.  Disposition: Discharge disposition: 01-Home or Self Care       Discharge Instructions    Diet - low sodium heart healthy   Complete by: As directed    Discharge instructions   Complete by: As directed    1. Limited activity 2. Liquid and soft diet, advance as tolerated 3. May bathe and shower day after surgery 4. Wound care - Gentle cleaning with soap and water 5. DO NOT APPLY ANY OINTMENT 6. Elevate Head of Bed  Please contact [facility] [phone]) for any additional concerns.   Increase activity slowly   Complete by: As directed          Signed: Jerrell Belfast 08/08/2018, 9:36 AM

## 2018-08-08 NOTE — Plan of Care (Signed)
  Problem: Education: Goal: Knowledge of General Education information will improve Description: Including pain rating scale, medication(s)/side effects and non-pharmacologic comfort measures Outcome: Completed/Met   Problem: Health Behavior/Discharge Planning: Goal: Ability to manage health-related needs will improve Outcome: Completed/Met   Problem: Clinical Measurements: Goal: Ability to maintain clinical measurements within normal limits will improve Outcome: Completed/Met Goal: Will remain free from infection Outcome: Completed/Met Goal: Diagnostic test results will improve Outcome: Completed/Met Goal: Respiratory complications will improve Outcome: Completed/Met Goal: Cardiovascular complication will be avoided Outcome: Completed/Met   Problem: Activity: Goal: Risk for activity intolerance will decrease Outcome: Completed/Met   Problem: Nutrition: Goal: Adequate nutrition will be maintained Outcome: Completed/Met   Problem: Coping: Goal: Level of anxiety will decrease Outcome: Completed/Met   Problem: Elimination: Goal: Will not experience complications related to bowel motility Outcome: Completed/Met Goal: Will not experience complications related to urinary retention Outcome: Completed/Met   Problem: Pain Managment: Goal: General experience of comfort will improve Outcome: Completed/Met   Problem: Safety: Goal: Ability to remain free from injury will improve Outcome: Completed/Met   Problem: Skin Integrity: Goal: Risk for impaired skin integrity will decrease Outcome: Completed/Met   Problem: Education: Goal: Required Educational Video(s) Outcome: Completed/Met   Problem: Clinical Measurements: Goal: Postoperative complications will be avoided or minimized Outcome: Completed/Met   Problem: Skin Integrity: Goal: Demonstration of wound healing without infection will improve Outcome: Completed/Met

## 2018-08-30 ENCOUNTER — Ambulatory Visit: Payer: Medicare Other | Admitting: Internal Medicine

## 2018-09-18 ENCOUNTER — Encounter: Payer: Self-pay | Admitting: Internal Medicine

## 2018-09-18 ENCOUNTER — Ambulatory Visit (INDEPENDENT_AMBULATORY_CARE_PROVIDER_SITE_OTHER): Payer: Medicare Other | Admitting: Internal Medicine

## 2018-09-18 ENCOUNTER — Other Ambulatory Visit: Payer: Self-pay

## 2018-09-18 VITALS — BP 114/69 | HR 76 | Temp 98.1°F | Ht 72.0 in | Wt 265.0 lb

## 2018-09-18 DIAGNOSIS — R002 Palpitations: Secondary | ICD-10-CM | POA: Diagnosis not present

## 2018-09-18 DIAGNOSIS — R0600 Dyspnea, unspecified: Secondary | ICD-10-CM

## 2018-09-18 DIAGNOSIS — I10 Essential (primary) hypertension: Secondary | ICD-10-CM

## 2018-09-18 NOTE — Progress Notes (Signed)
HPI [name] returns today for followup of PAF, SVT and HTN. He is a pleasant 68 yo man who I saw 3 months ago with the above problems. At that time he was complaining of dyspnea with exertion and palpitations. He wore a cardiac monitor and this demonstrated NSVT and NS AT and occaisional PAC's and PVC"s. He had a 2D echo which demonstrated preserved LV function and concentric LVH. He has started back walking and feels better. He still feels palpitations. Allergies  Allergen Reactions  . Halcion [Triazolam] Other (See Comments)    Broke the hospital bed     Current Outpatient Medications  Medication Sig Dispense Refill  . ALPRAZolam (XANAX) 1 MG tablet Take 1 mg by mouth 3 (three) times daily.     . furosemide (LASIX) 40 MG tablet Take 40 mg by mouth 2 (two) times daily as needed (swelling feet/fluid retention.).     Marland Kitchen. levothyroxine (SYNTHROID, LEVOTHROID) 125 MCG tablet Take 125 mcg by mouth daily before breakfast.   7  . losartan (COZAAR) 25 MG tablet Take 12.5 mg by mouth daily as needed (for blood pressure greater 140/70).     . metFORMIN (GLUCOPHAGE) 500 MG tablet Take 250 mg by mouth daily.    . metoprolol tartrate (LOPRESSOR) 25 MG tablet Take 12.5 mg by mouth daily as needed (tachycardia).    . nitroGLYCERIN (NITROSTAT) 0.4 MG SL tablet Place 1 tablet (0.4 mg total) under the tongue every 5 (five) minutes as needed for chest pain. 25 tablet 3  . Omega-3 Krill Oil 1000 MG CAPS Take 1,000 mg by mouth daily.    Marland Kitchen. PARoxetine (PAXIL) 20 MG tablet Take 10 mg by mouth daily.     . potassium chloride (K-DUR,KLOR-CON) 10 MEQ tablet TAKE 1 TABLET BY MOUTH EVERY DAY (Patient taking differently: Take 10 mEq by mouth daily as needed (with lasix (swelling/retention)). ) 90 tablet 1  . tamsulosin (FLOMAX) 0.4 MG CAPS capsule Take 0.4 mg by mouth daily.     No current facility-administered medications for this visit.      Past Medical History:  Diagnosis Date  . AF (atrial  fibrillation) (HCC)   . Anxiety and depression   . Hypertension   . Hypothyroidism   . Obesity   . Palpitations   . SVT (supraventricular tachycardia) (HCC)     ROS:   All systems reviewed and negative except as noted in the HPI.   Past Surgical History:  Procedure Laterality Date  . CARDIOVASCULAR STRESS TEST  09/05/2007  . CHOLECYSTECTOMY    . CYSTOSCOPY WITH INSERTION OF UROLIFT N/A 08/13/2017   Procedure: CYSTOSCOPY WITH INSERTION OF UROLIFT;  Surgeon: Malen GauzeMcKenzie, Patrick L, MD;  Location: [facility];  Service: Urology;  Laterality: N/A;  . PAROTIDECTOMY  08/07/2018  . PAROTIDECTOMY Right 08/07/2018   Procedure: PAROTIDECTOMY;  Surgeon: Osborn CohoShoemaker, David, MD;  [facility];  Service: ENT;  Laterality: Right;  . TONSILLECTOMY       Family History  Problem Relation Age of Onset  . Heart disease Mother   . Heart disease Father      Social History   Socioeconomic History  . Marital status: Married    Spouse name: Not on file  . Number of children: Not on file  . Years of education: Not on file  . Highest education level: Not on file  Occupational History  . Not on file  Social Needs  . Financial resource strain: Not on file  .  Food insecurity    Worry: Not on file    Inability: Not on file  . Transportation needs    Medical: Not on file    Non-medical: Not on file  Tobacco Use  . Smoking status: Former Research scientist (life sciences)  . Smokeless tobacco: Former Systems developer    Types: Snuff  Substance and Sexual Activity  . Alcohol use: No  . Drug use: No  . Sexual activity: Not on file  Lifestyle  . Physical activity    Days per week: Not on file    Minutes per session: Not on file  . Stress: Not on file  Relationships  . Social Herbalist on phone: Not on file    Gets together: Not on file    Attends religious service: Not on file    Active member of club or organization: Not on file    Attends meetings of clubs or organizations: Not on file    Relationship status: Not on  file  . Intimate partner violence    Fear of current or ex partner: Not on file    Emotionally abused: Not on file    Physically abused: Not on file    Forced sexual activity: Not on file  Other Topics Concern  . Not on file  Social History Narrative  . Not on file     BP 114/69   Pulse 76   Temp 98.1 F (36.7 C)   Ht 6' (1.829 m)   Wt 265 lb (120.2 kg)   SpO2 94%   BMI 35.94 kg/m   Physical Exam:  Well appearing 68 yo man, NAD HEENT: Unremarkable Neck:  6 cm JVD, no thyromegally Lymphatics:  No adenopathy Back:  No CVA tenderness Lungs:  Clear HEART:  Regular rate rhythm, no murmurs, no rubs, no clicks Abd:  soft, positive bowel sounds, no organomegally, no rebound, no guarding Ext:  2 plus pulses, no edema, no cyanosis, no clubbing Skin:  No rashes no nodules Neuro:  CN II through XII intact, motor grossly intact  Assess/Plan: 1. PVC's and NSVT and PAC's - I discussed the benign nature of his symptoms, avoidance of caffeine and ETOH, and medical options in addition to his beta blocker. For now, he will plan on watchful waiting. 2. Dyspnea - He has evidence on the echo that his sob is multifactorial but in part due to diastolic heart failure. I have recommended he continue his beta blocker and diuretic and avoid salty foods and walk daily. 3. Obesity - he is overweight and I have encouraged him to lose weight. 4. HTN - his bp is now under control. He notes that at home it is better as well.  Mikle Bosworth.D.

## 2018-09-18 NOTE — Patient Instructions (Signed)
Medication Instructions:  Your physician recommends that you continue on your current medications as directed. Please refer to the Current Medication list given to you today.  If you need a refill on your cardiac medications before your next appointment, please call your pharmacy.   Lab work: NONE  If you have labs (blood work) drawn today and your tests are completely normal, you will receive your results only by: . MyChart Message (if you have MyChart) OR . A paper copy in the mail If you have any lab test that is abnormal or we need to change your treatment, we will call you to review the results.  Testing/Procedures: NONE   Follow-Up: At [facility], you and your health needs are our priority.  As part of our continuing mission to provide you with exceptional heart care, we have created designated Provider Care Teams.  These Care Teams include your primary Cardiologist (physician) and Advanced Practice Providers (APPs -  Physician Assistants and Nurse Practitioners) who all work together to provide you with the care you need, when you need it. You will need a follow up appointment in 1 years.  Please call our office 2 months in advance to schedule this appointment.  You may see None or one of the following Advanced Practice Providers on your designated Care Team:   Amber Seiler, NP . Renee Ursuy, PA-C  Any Other Special Instructions Will Be Listed Below (If Applicable). Thank you for choosing [facility]!     

## 2018-12-01 ENCOUNTER — Other Ambulatory Visit: Payer: Self-pay | Admitting: Adult Health

## 2019-01-06 DIAGNOSIS — M26623 Arthralgia of bilateral temporomandibular joint: Secondary | ICD-10-CM | POA: Insufficient documentation

## 2019-02-20 DIAGNOSIS — I10 Essential (primary) hypertension: Secondary | ICD-10-CM | POA: Diagnosis not present

## 2019-02-20 DIAGNOSIS — E7800 Pure hypercholesterolemia, unspecified: Secondary | ICD-10-CM | POA: Diagnosis not present

## 2019-02-20 DIAGNOSIS — E119 Type 2 diabetes mellitus without complications: Secondary | ICD-10-CM | POA: Diagnosis not present

## 2019-03-21 DIAGNOSIS — E119 Type 2 diabetes mellitus without complications: Secondary | ICD-10-CM | POA: Diagnosis not present

## 2019-03-21 DIAGNOSIS — E7800 Pure hypercholesterolemia, unspecified: Secondary | ICD-10-CM | POA: Diagnosis not present

## 2019-03-21 DIAGNOSIS — I10 Essential (primary) hypertension: Secondary | ICD-10-CM | POA: Diagnosis not present

## 2019-04-10 DIAGNOSIS — J449 Chronic obstructive pulmonary disease, unspecified: Secondary | ICD-10-CM | POA: Diagnosis not present

## 2019-04-10 DIAGNOSIS — Z87891 Personal history of nicotine dependence: Secondary | ICD-10-CM | POA: Diagnosis not present

## 2019-04-10 DIAGNOSIS — I480 Paroxysmal atrial fibrillation: Secondary | ICD-10-CM | POA: Diagnosis not present

## 2019-04-10 DIAGNOSIS — I10 Essential (primary) hypertension: Secondary | ICD-10-CM | POA: Diagnosis not present

## 2019-04-10 DIAGNOSIS — J309 Allergic rhinitis, unspecified: Secondary | ICD-10-CM | POA: Diagnosis not present

## 2019-04-10 DIAGNOSIS — E1165 Type 2 diabetes mellitus with hyperglycemia: Secondary | ICD-10-CM | POA: Diagnosis not present

## 2019-04-10 DIAGNOSIS — Z299 Encounter for prophylactic measures, unspecified: Secondary | ICD-10-CM | POA: Diagnosis not present

## 2019-04-25 DIAGNOSIS — J441 Chronic obstructive pulmonary disease with (acute) exacerbation: Secondary | ICD-10-CM | POA: Diagnosis not present

## 2019-04-25 DIAGNOSIS — Z299 Encounter for prophylactic measures, unspecified: Secondary | ICD-10-CM | POA: Diagnosis not present

## 2019-04-25 DIAGNOSIS — J449 Chronic obstructive pulmonary disease, unspecified: Secondary | ICD-10-CM | POA: Diagnosis not present

## 2019-04-25 DIAGNOSIS — E1165 Type 2 diabetes mellitus with hyperglycemia: Secondary | ICD-10-CM | POA: Diagnosis not present

## 2019-04-25 DIAGNOSIS — I10 Essential (primary) hypertension: Secondary | ICD-10-CM | POA: Diagnosis not present

## 2019-05-29 DIAGNOSIS — Z299 Encounter for prophylactic measures, unspecified: Secondary | ICD-10-CM | POA: Diagnosis not present

## 2019-05-29 DIAGNOSIS — I10 Essential (primary) hypertension: Secondary | ICD-10-CM | POA: Diagnosis not present

## 2019-05-29 DIAGNOSIS — R0602 Shortness of breath: Secondary | ICD-10-CM | POA: Diagnosis not present

## 2019-05-29 DIAGNOSIS — J441 Chronic obstructive pulmonary disease with (acute) exacerbation: Secondary | ICD-10-CM | POA: Diagnosis not present

## 2019-06-12 DIAGNOSIS — I10 Essential (primary) hypertension: Secondary | ICD-10-CM | POA: Diagnosis not present

## 2019-06-12 DIAGNOSIS — K2100 Gastro-esophageal reflux disease with esophagitis, without bleeding: Secondary | ICD-10-CM | POA: Diagnosis not present

## 2019-06-12 DIAGNOSIS — K5909 Other constipation: Secondary | ICD-10-CM | POA: Diagnosis not present

## 2019-06-12 DIAGNOSIS — R109 Unspecified abdominal pain: Secondary | ICD-10-CM | POA: Diagnosis not present

## 2019-06-12 DIAGNOSIS — Z299 Encounter for prophylactic measures, unspecified: Secondary | ICD-10-CM | POA: Diagnosis not present

## 2019-06-29 DIAGNOSIS — I10 Essential (primary) hypertension: Secondary | ICD-10-CM | POA: Diagnosis not present

## 2019-06-29 DIAGNOSIS — E7800 Pure hypercholesterolemia, unspecified: Secondary | ICD-10-CM | POA: Diagnosis not present

## 2019-06-29 DIAGNOSIS — E119 Type 2 diabetes mellitus without complications: Secondary | ICD-10-CM | POA: Diagnosis not present

## 2019-07-07 DIAGNOSIS — E1142 Type 2 diabetes mellitus with diabetic polyneuropathy: Secondary | ICD-10-CM | POA: Diagnosis not present

## 2019-07-07 DIAGNOSIS — Z299 Encounter for prophylactic measures, unspecified: Secondary | ICD-10-CM | POA: Diagnosis not present

## 2019-07-07 DIAGNOSIS — M549 Dorsalgia, unspecified: Secondary | ICD-10-CM | POA: Diagnosis not present

## 2019-07-07 DIAGNOSIS — E1165 Type 2 diabetes mellitus with hyperglycemia: Secondary | ICD-10-CM | POA: Diagnosis not present

## 2019-07-07 DIAGNOSIS — I10 Essential (primary) hypertension: Secondary | ICD-10-CM | POA: Diagnosis not present

## 2019-07-17 DIAGNOSIS — E1165 Type 2 diabetes mellitus with hyperglycemia: Secondary | ICD-10-CM | POA: Diagnosis not present

## 2019-07-17 DIAGNOSIS — Z299 Encounter for prophylactic measures, unspecified: Secondary | ICD-10-CM | POA: Diagnosis not present

## 2019-07-17 DIAGNOSIS — E1142 Type 2 diabetes mellitus with diabetic polyneuropathy: Secondary | ICD-10-CM | POA: Diagnosis not present

## 2019-07-17 DIAGNOSIS — I10 Essential (primary) hypertension: Secondary | ICD-10-CM | POA: Diagnosis not present

## 2019-07-17 DIAGNOSIS — J449 Chronic obstructive pulmonary disease, unspecified: Secondary | ICD-10-CM | POA: Diagnosis not present

## 2019-08-25 DIAGNOSIS — E7800 Pure hypercholesterolemia, unspecified: Secondary | ICD-10-CM | POA: Diagnosis not present

## 2019-08-25 DIAGNOSIS — Z299 Encounter for prophylactic measures, unspecified: Secondary | ICD-10-CM | POA: Diagnosis not present

## 2019-08-25 DIAGNOSIS — J069 Acute upper respiratory infection, unspecified: Secondary | ICD-10-CM | POA: Diagnosis not present

## 2019-08-25 DIAGNOSIS — Z79899 Other long term (current) drug therapy: Secondary | ICD-10-CM | POA: Diagnosis not present

## 2019-08-25 DIAGNOSIS — E039 Hypothyroidism, unspecified: Secondary | ICD-10-CM | POA: Diagnosis not present

## 2019-08-25 DIAGNOSIS — Z0000 Encounter for general adult medical examination without abnormal findings: Secondary | ICD-10-CM | POA: Diagnosis not present

## 2019-08-25 DIAGNOSIS — I10 Essential (primary) hypertension: Secondary | ICD-10-CM | POA: Diagnosis not present

## 2019-08-25 DIAGNOSIS — Z7189 Other specified counseling: Secondary | ICD-10-CM | POA: Diagnosis not present

## 2019-08-29 DIAGNOSIS — E7800 Pure hypercholesterolemia, unspecified: Secondary | ICD-10-CM | POA: Diagnosis not present

## 2019-08-29 DIAGNOSIS — E119 Type 2 diabetes mellitus without complications: Secondary | ICD-10-CM | POA: Diagnosis not present

## 2019-08-29 DIAGNOSIS — I10 Essential (primary) hypertension: Secondary | ICD-10-CM | POA: Diagnosis not present

## 2019-09-24 DIAGNOSIS — Z20822 Contact with and (suspected) exposure to covid-19: Secondary | ICD-10-CM | POA: Diagnosis not present

## 2019-09-24 DIAGNOSIS — J40 Bronchitis, not specified as acute or chronic: Secondary | ICD-10-CM | POA: Diagnosis not present

## 2019-09-24 DIAGNOSIS — R0602 Shortness of breath: Secondary | ICD-10-CM | POA: Diagnosis not present

## 2019-09-30 DIAGNOSIS — E7800 Pure hypercholesterolemia, unspecified: Secondary | ICD-10-CM | POA: Diagnosis not present

## 2019-09-30 DIAGNOSIS — I10 Essential (primary) hypertension: Secondary | ICD-10-CM | POA: Diagnosis not present

## 2019-09-30 DIAGNOSIS — E119 Type 2 diabetes mellitus without complications: Secondary | ICD-10-CM | POA: Diagnosis not present

## 2019-10-24 DIAGNOSIS — Z299 Encounter for prophylactic measures, unspecified: Secondary | ICD-10-CM | POA: Diagnosis not present

## 2019-10-24 DIAGNOSIS — J441 Chronic obstructive pulmonary disease with (acute) exacerbation: Secondary | ICD-10-CM | POA: Diagnosis not present

## 2019-10-24 DIAGNOSIS — E1165 Type 2 diabetes mellitus with hyperglycemia: Secondary | ICD-10-CM | POA: Diagnosis not present

## 2019-10-24 DIAGNOSIS — I10 Essential (primary) hypertension: Secondary | ICD-10-CM | POA: Diagnosis not present

## 2019-10-27 ENCOUNTER — Other Ambulatory Visit: Payer: Self-pay | Admitting: Adult Health

## 2019-10-30 DIAGNOSIS — I10 Essential (primary) hypertension: Secondary | ICD-10-CM | POA: Diagnosis not present

## 2019-10-30 DIAGNOSIS — E119 Type 2 diabetes mellitus without complications: Secondary | ICD-10-CM | POA: Diagnosis not present

## 2019-10-30 DIAGNOSIS — E7800 Pure hypercholesterolemia, unspecified: Secondary | ICD-10-CM | POA: Diagnosis not present

## 2019-11-10 ENCOUNTER — Other Ambulatory Visit: Payer: Self-pay

## 2019-11-10 ENCOUNTER — Emergency Department (HOSPITAL_COMMUNITY)
Admission: EM | Admit: 2019-11-10 | Discharge: 2019-11-10 | Disposition: A | Discharge status: Home / Self Care | Payer: Medicare Other | Attending: Emergency Medicine | Admitting: Emergency Medicine

## 2019-11-10 ENCOUNTER — Encounter (HOSPITAL_COMMUNITY): Payer: Self-pay | Admitting: *Deleted

## 2019-11-10 ENCOUNTER — Emergency Department (HOSPITAL_COMMUNITY): Payer: Medicare Other

## 2019-11-10 DIAGNOSIS — R531 Weakness: Secondary | ICD-10-CM | POA: Diagnosis not present

## 2019-11-10 DIAGNOSIS — Z7984 Long term (current) use of oral hypoglycemic drugs: Secondary | ICD-10-CM | POA: Diagnosis not present

## 2019-11-10 DIAGNOSIS — E039 Hypothyroidism, unspecified: Secondary | ICD-10-CM | POA: Insufficient documentation

## 2019-11-10 DIAGNOSIS — R079 Chest pain, unspecified: Secondary | ICD-10-CM | POA: Diagnosis present

## 2019-11-10 DIAGNOSIS — Z87891 Personal history of nicotine dependence: Secondary | ICD-10-CM | POA: Insufficient documentation

## 2019-11-10 DIAGNOSIS — I10 Essential (primary) hypertension: Secondary | ICD-10-CM | POA: Insufficient documentation

## 2019-11-10 DIAGNOSIS — E119 Type 2 diabetes mellitus without complications: Secondary | ICD-10-CM | POA: Diagnosis not present

## 2019-11-10 DIAGNOSIS — R072 Precordial pain: Secondary | ICD-10-CM | POA: Diagnosis not present

## 2019-11-10 DIAGNOSIS — J90 Pleural effusion, not elsewhere classified: Secondary | ICD-10-CM | POA: Diagnosis not present

## 2019-11-10 DIAGNOSIS — Z79899 Other long term (current) drug therapy: Secondary | ICD-10-CM | POA: Diagnosis not present

## 2019-11-10 LAB — TROPONIN I (HIGH SENSITIVITY)
Troponin I (High Sensitivity): 5 ng/L (ref <18)
Troponin I (High Sensitivity): 6 ng/L (ref <18)

## 2019-11-10 LAB — CBC
HCT: 46.3 % (ref 39.0–52.0)
Hemoglobin: 15.9 g/dL (ref 13.0–17.0)
MCH: 33.8 pg (ref 26.0–34.0)
MCHC: 34.3 g/dL (ref 30.0–36.0)
MCV: 98.3 fL (ref 80.0–100.0)
Platelets: 125 10*3/uL — ABNORMAL LOW (ref 150–400)
RBC: 4.71 MIL/uL (ref 4.22–5.81)
RDW: 12.2 % (ref 11.5–15.5)
WBC: 7.1 10*3/uL (ref 4.0–10.5)
nRBC: 0 % (ref 0.0–0.2)

## 2019-11-10 LAB — BASIC METABOLIC PANEL
Anion gap: 9 (ref 5–15)
BUN: 15 mg/dL (ref 8–23)
CO2: 26 mmol/L (ref 22–32)
Calcium: 8.7 mg/dL — ABNORMAL LOW (ref 8.9–10.3)
Chloride: 102 mmol/L (ref 98–111)
Creatinine, Ser: 1.18 mg/dL (ref 0.61–1.24)
GFR, Estimated: >60 mL/min (ref >60)
Glucose, Bld: 117 mg/dL — ABNORMAL HIGH (ref 70–99)
Potassium: 4 mmol/L (ref 3.5–5.1)
Sodium: 137 mmol/L (ref 135–145)

## 2019-11-10 NOTE — ED Triage Notes (Signed)
States he went for a walk this am and began to feel weakness in chest and shoulders

## 2019-11-10 NOTE — ED Provider Notes (Signed)
[facility] EMERGENCY DEPARTMENT Provider Note   CSN: [id number] Arrival date & time: 11/10/19  1344     History Chief Complaint  Patient presents with  . Fatigue  . Chest Pain    Justin [name] is a 69 y.o. male.  Patient followed by Dr. Sharrell Ku.  For atrial fibrillation history of SVT also history hypertension history of diabetes.  Last seen by them August 2020.  Had a 2D echo cardiogram and not the 2 remote past that showed preserved left ventricular function.  Patient is not on any blood thinners.  Other than taking a baby aspirin a day.  Patient is on a beta-blocker.  This morning at about 9 in the morning he walked a track that he normally walks but the last time he did it was about a week ago due to weather.  And during that walk he developed bilateral neck pain pain that moved to the left shoulder and then down the left arm.  Some associated with shortness of breath lasted about 30 minutes.  Patient got back to the car symptoms resolved he went home walked in the house was able to walk up stairs although's did it slowly without any recurrent pain.  Came here with walking and here no recurrent pain.  No prior history of any pain like this.  No known coronary artery disease.  Other than his risk factors already mentioned.  Patient has remained pain-free.        Past Medical History:  Diagnosis Date  . AF (atrial fibrillation) (HCC)   . Anxiety and depression   . Hypertension   . Hypothyroidism   . Obesity   . Palpitations   . SVT (supraventricular tachycardia) [facility])     Patient Active Problem List   Diagnosis Date Noted  . Parotid mass 08/07/2018  . Chest pressure 02/07/2013  . OBESITY, UNSPECIFIED 03/24/2009  . SUPRAVENTRICULAR TACHYCARDIA 03/15/2009  . HYPOTHYROIDISM 03/12/2009  . DEPRESSION/ANXIETY 03/12/2009  . ATRIAL FIBRILLATION 03/12/2009  . PALPITATIONS 03/12/2009    Past Surgical History:  Procedure Laterality Date  . CARDIOVASCULAR STRESS TEST   09/05/2007  . CHOLECYSTECTOMY    . CYSTOSCOPY WITH INSERTION OF UROLIFT N/A 08/13/2017   Procedure: CYSTOSCOPY WITH INSERTION OF UROLIFT;  Surgeon: Malen Gauze, MD;  Location: [facility];  Service: Urology;  Laterality: N/A;  . PAROTIDECTOMY  08/07/2018  . PAROTIDECTOMY Right 08/07/2018   Procedure: PAROTIDECTOMY;  Surgeon: Osborn Coho, MD;  Location: [facility] OR;  Service: ENT;  Laterality: Right;  . TONSILLECTOMY         Family History  Problem Relation Age of Onset  . Heart disease Mother   . Heart disease Father     Social History   Tobacco Use  . Smoking status: Former Games developer  . Smokeless tobacco: Former Neurosurgeon    Types: Snuff  Vaping Use  . Vaping Use: Never used  Substance Use Topics  . Alcohol use: No  . Drug use: No    Home Medications Prior to Admission medications   Medication Sig Start Date End Date Taking? Authorizing Provider  ALPRAZolam Prudy Feeler) 1 MG tablet Take 1 mg by mouth 3 (three) times daily.     [provider]  furosemide (LASIX) 40 MG tablet Take 40 mg by mouth 2 (two) times daily as needed (swelling feet/fluid retention.).  07/11/18   [provider]  levothyroxine (SYNTHROID, LEVOTHROID) 125 MCG tablet Take 125 mcg by mouth daily before breakfast.  06/19/17   [provider]  losartan (COZAAR) 25 MG tablet Take 12.5 mg by mouth daily as needed (for blood pressure greater 140/70).  07/16/17   [provider]  metFORMIN (GLUCOPHAGE) 500 MG tablet Take 250 mg by mouth daily.    [provider]  metoprolol tartrate (LOPRESSOR) 25 MG tablet Take 12.5 mg by mouth daily as needed (tachycardia).    [provider]  nitroGLYCERIN (NITROSTAT) 0.4 MG SL tablet Place 1 tablet (0.4 mg total) under the tongue every 5 (five) minutes as needed for chest pain. 08/19/15   Marinus Maw, MD  Omega-3 Krill Oil 1000 MG CAPS Take 1,000 mg by mouth daily.    [provider]  PARoxetine (PAXIL) 20 MG tablet Take 10  mg by mouth daily.     [provider]  potassium chloride (KLOR-CON) 10 MEQ tablet TAKE 1 TABLET BY MOUTH EVERY DAY 10/27/19   Marinus Maw, MD  tamsulosin (FLOMAX) 0.4 MG CAPS capsule Take 0.4 mg by mouth daily. 07/11/18   [provider]    Allergies    Halcion [triazolam]  Review of Systems   Review of Systems  Constitutional: Negative for chills and fever.  HENT: Negative for congestion, rhinorrhea and sore throat.   Eyes: Negative for visual disturbance.  Respiratory: Positive for shortness of breath. Negative for cough.   Cardiovascular: Positive for chest pain. Negative for leg swelling.  Gastrointestinal: Negative for abdominal pain, diarrhea, nausea and vomiting.  Genitourinary: Negative for dysuria.  Musculoskeletal: Negative for back pain and neck pain.  Skin: Negative for rash.  Neurological: Negative for dizziness, light-headedness and headaches.  Hematological: Does not bruise/bleed easily.  Psychiatric/Behavioral: Negative for confusion.    Physical Exam Updated Vital Signs BP 135/71   Pulse (!) 56   Temp 98.1 F (36.7 C) (Oral)   Resp 17   SpO2 99%   Physical Exam Vitals and nursing note reviewed.  Constitutional:      Appearance: Normal appearance. He is well-developed.  HENT:     Head: Normocephalic and atraumatic.  Eyes:     Extraocular Movements: Extraocular movements intact.     Conjunctiva/sclera: Conjunctivae normal.     Pupils: Pupils are equal, round, and reactive to light.  Cardiovascular:     Rate and Rhythm: Normal rate and regular rhythm.     Heart sounds: No murmur heard.   Pulmonary:     Effort: Pulmonary effort is normal. No respiratory distress.     Breath sounds: Normal breath sounds.  Chest:     Chest wall: No tenderness.  Abdominal:     Palpations: Abdomen is soft.     Tenderness: There is no abdominal tenderness.  Musculoskeletal:        General: No swelling. Normal range of motion.     Cervical back:  Normal range of motion and neck supple.  Skin:    General: Skin is warm and dry.  Neurological:     General: No focal deficit present.     Mental Status: He is alert and oriented to person, place, and time.     Cranial Nerves: No cranial nerve deficit.     Sensory: No sensory deficit.     Motor: No weakness.     ED Results / Procedures / Treatments   Labs (all labs ordered are listed, but only abnormal results are displayed) Labs Reviewed  BASIC METABOLIC PANEL - Abnormal; Notable for the following components:      Result Value   Glucose,  Bld 117 (*)    Calcium 8.7 (*)    All other components within normal limits  CBC - Abnormal; Notable for the following components:   Platelets 125 (*)    All other components within normal limits  TROPONIN I (HIGH SENSITIVITY)  TROPONIN I (HIGH SENSITIVITY)    EKG EKG Interpretation  Date/Time:  Monday November 10 2019 15:12:41 EDT Ventricular Rate:  65 PR Interval:  228 QRS Duration: 74 QT Interval:  404 QTC Calculation: 420 R Axis:   20 Text Interpretation: Sinus rhythm with 1st degree A-V block Low voltage QRS Borderline ECG Confirmed by Vanetta Mulders [phone]) on 11/10/2019 3:33:50 PM   Radiology DG Chest 2 View  Result Date: 11/10/2019 CLINICAL DATA:  Chest pain, weakness EXAM: CHEST - 2 VIEW COMPARISON:  09/24/2019, 07/18/2017 FINDINGS: The heart size and mediastinal contours are stable. Atherosclerotic calcification of the aortic knob. Chronic mildly coarsened bilateral interstitial markings. No focal airspace consolidation, pleural effusion, or pneumothorax. The visualized skeletal structures are unremarkable. IMPRESSION: 1. No active cardiopulmonary disease. 2. Findings suggestive of chronic bronchitic type lung changes. Electronically Signed   By: Duanne Guess D.O.   On: 11/10/2019 16:15    Procedures Procedures (including critical care time)  Medications Ordered in ED Medications - No data to display  ED Course    I have reviewed the triage vital signs and the nursing notes.  Pertinent labs & imaging results that were available during my care of the patient were reviewed by me and considered in my medical decision making (see chart for details).    MDM Rules/Calculators/A&P                          Patient with onset of chest pain at 9 this morning while walking this tract.  He did that last time about a week ago without any problems.  He had pain kind of in bilateral neck moved to the left shoulder and then down left arm.  Associated with shortness of breath no nausea or vomiting lasted 30 minutes.  Has had no recurrence since.  No pain with walking back in the house no pain with walking up the stairs however he did go slow.  No pain with walking and here.  Patient risk factors include diabetes hypertension does have a history of atrial fibrillation.  Not in that now.  Is on beta-blockers.  Is not on blood thinners.  But does take a baby aspirin a day.  Patient's had 2 - troponins the second 1 was at 2140.  So several hours after the event.  I think based on this being negative and no recurrence of pain patient is safe for discharge home close follow-up with cardiology and precautions and he will continue take a baby aspirin a day.  In addition patient will return for any recurrence of chest pain lasting 20 minutes or longer.  He is already followed by Dr. Sharrell Ku.    Final Clinical Impression(s) / ED Diagnoses Final diagnoses:  Precordial pain    Rx / DC Orders ED Discharge Orders    None       Vanetta Mulders, MD 11/10/19 2259

## 2019-11-10 NOTE — Discharge Instructions (Addendum)
Continue take a baby aspirin a day.  Make an appointment to follow back up with cardiology.  Avoid excessive exercise.  Return for recurrence of chest pain lasting 20 minutes or longer.  Or for any new or worse symptoms.  Your heart markers today were normal.  But the chest pain you experienced earlier today could be a warning sign from your heart.  So follow-up with cardiology is very important.

## 2019-11-28 DIAGNOSIS — I10 Essential (primary) hypertension: Secondary | ICD-10-CM | POA: Diagnosis not present

## 2019-11-28 DIAGNOSIS — E119 Type 2 diabetes mellitus without complications: Secondary | ICD-10-CM | POA: Diagnosis not present

## 2019-11-28 DIAGNOSIS — E7800 Pure hypercholesterolemia, unspecified: Secondary | ICD-10-CM | POA: Diagnosis not present

## 2019-12-04 ENCOUNTER — Ambulatory Visit (INDEPENDENT_AMBULATORY_CARE_PROVIDER_SITE_OTHER): Payer: Medicare Other | Admitting: Internal Medicine

## 2019-12-04 ENCOUNTER — Other Ambulatory Visit: Payer: Self-pay

## 2019-12-04 ENCOUNTER — Encounter: Payer: Self-pay | Admitting: Internal Medicine

## 2019-12-04 VITALS — BP 138/76 | HR 75 | Ht 70.0 in | Wt 278.0 lb

## 2019-12-04 DIAGNOSIS — R002 Palpitations: Secondary | ICD-10-CM

## 2019-12-04 MED ORDER — METOPROLOL SUCCINATE ER 25 MG PO TB24: 25.0000 mg | ORAL_TABLET | Freq: Every day | ORAL | 3 refills | Status: DC

## 2019-12-04 NOTE — Patient Instructions (Signed)
Medication Instructions:  Your physician has recommended you make the following change in your medication:   Stop Taking Metoprolol Tartrate  Start Taking Toprol XL 25 mg Daily   *If you need a refill on your cardiac medications before your next appointment, please call your pharmacy*   Lab Work: NONE   If you have labs (blood work) drawn today and your tests are completely normal, you will receive your results only by:  MyChart Message (if you have MyChart) OR  A paper copy in the mail If you have any lab test that is abnormal or we need to change your treatment, we will call you to review the results.   Testing/Procedures: NONE    Follow-Up: At [facility], you and your health needs are our priority.  As part of our continuing mission to provide you with exceptional heart care, we have created designated Provider Care Teams.  These Care Teams include your primary Cardiologist (physician) and Advanced Practice Providers (APPs -  Physician Assistants and Nurse Practitioners) who all work together to provide you with the care you need, when you need it.  We recommend signing up for the patient portal called "MyChart".  Sign up information is provided on this After Visit Summary.  MyChart is used to connect with patients for Virtual Visits (Telemedicine).  Patients are able to view lab/test results, encounter notes, upcoming appointments, etc.  Non-urgent messages can be sent to your provider as well.   To learn more about what you can do with MyChart, go to [email or web address].    Your next appointment:   4 month(s)  The format for your next appointment:   In Person  Provider:   Lewayne Bunting, MD   Other Instructions Thank you for choosing [facility]!

## 2019-12-04 NOTE — Progress Notes (Signed)
HPI Mr. Chance returns today for followup. He has a h/o atrial and ventricular arrhythmias, NSVT and NS AT. He admits to dietary indiscretion. He is not taking any of his beta blocker. He thinks his palpitations are getting worse. No chest pain. He has class 2 dyspnea. No syncope. Allergies  Allergen Reactions   Halcion [Triazolam] Other (See Comments)    Broke the hospital bed     Current Outpatient Medications  Medication Sig Dispense Refill   albuterol (VENTOLIN HFA) 108 (90 Base) MCG/ACT inhaler Inhale 2 puffs into the lungs 4 (four) times daily.     ALPRAZolam (XANAX) 1 MG tablet Take 1 mg by mouth 3 (three) times daily.      aspirin 81 MG EC tablet aspirin 81 mg tablet,delayed release  TAKE 1 TABLET BY MOUTH DAILY     cetirizine (ZYRTEC) 10 MG tablet Take 10 mg by mouth daily.     furosemide (LASIX) 40 MG tablet Take 40 mg by mouth 2 (two) times daily as needed (swelling feet/fluid retention.).      levothyroxine (SYNTHROID, LEVOTHROID) 125 MCG tablet Take 125 mcg by mouth daily before breakfast.   7   losartan (COZAAR) 25 MG tablet Take 12.5 mg by mouth daily as needed (for blood pressure greater 140/70).      metFORMIN (GLUCOPHAGE) 500 MG tablet Take 250 mg by mouth daily.     metoprolol tartrate (LOPRESSOR) 25 MG tablet Take 12.5 mg by mouth daily as needed (tachycardia).     nitroGLYCERIN (NITROSTAT) 0.4 MG SL tablet Place 1 tablet (0.4 mg total) under the tongue every 5 (five) minutes as needed for chest pain. 25 tablet 3   Omega-3 Krill Oil 1000 MG CAPS Take 1,000 mg by mouth daily.     PARoxetine (PAXIL) 20 MG tablet Take 10 mg by mouth daily.      potassium chloride (KLOR-CON) 10 MEQ tablet TAKE 1 TABLET BY MOUTH EVERY DAY 90 tablet 2   SYMBICORT 160-4.5 MCG/ACT inhaler Inhale 2 puffs into the lungs 2 (two) times daily.     tamsulosin (FLOMAX) 0.4 MG CAPS capsule Take 0.4 mg by mouth daily.     No current facility-administered medications for this  visit.     Past Medical History:  Diagnosis Date   AF (atrial fibrillation) (HCC)    Anxiety and depression    Hypertension    Hypothyroidism    Obesity    Palpitations    SVT (supraventricular tachycardia) (HCC)     ROS:   All systems reviewed and negative except as noted in the HPI.   Past Surgical History:  Procedure Laterality Date   CARDIOVASCULAR STRESS TEST  09/05/2007   CHOLECYSTECTOMY     CYSTOSCOPY WITH INSERTION OF UROLIFT N/A 08/13/2017   Procedure: CYSTOSCOPY WITH INSERTION OF UROLIFT;  Surgeon: Malen Gauze, MD;  Location: [facility];  Service: Urology;  Laterality: N/A;   PAROTIDECTOMY  08/07/2018   PAROTIDECTOMY Right 08/07/2018   Procedure: PAROTIDECTOMY;  Surgeon: Osborn Coho, MD;  Location: [facility] OR;  Service: ENT;  Laterality: Right;   TONSILLECTOMY       Family History  Problem Relation Age of Onset   Heart disease Mother    Heart disease Father      Social History   Socioeconomic History   Marital status: Married    Spouse name: Not on file   Number of children: Not on file   Years of education: Not on file  Highest education level: Not on file  Occupational History   Not on file  Tobacco Use   Smoking status: Former Smoker   Smokeless tobacco: Former Neurosurgeon    Types: Snuff  Vaping Use   Vaping Use: Never used  Substance and Sexual Activity   Alcohol use: No   Drug use: No   Sexual activity: Not on file  Other Topics Concern   Not on file  Social History Narrative   Not on file   Social Determinants of Health   Financial Resource Strain:    Difficulty of Paying Living Expenses: Not on file  Food Insecurity:    Worried About Programme researcher, broadcasting/film/video in the Last Year: Not on file   [employer] of Food in the Last Year: Not on file  Transportation Needs:    Lack of Transportation (Medical): Not on file   Lack of Transportation (Non-Medical): Not on file  Physical Activity:    Days of Exercise per  Week: Not on file   Minutes of Exercise per Session: Not on file  Stress:    Feeling of Stress : Not on file  Social Connections:    Frequency of Communication with Friends and Family: Not on file   Frequency of Social Gatherings with Friends and Family: Not on file   Attends Religious Services: Not on file   Active Member of Clubs or Organizations: Not on file   Attends Banker Meetings: Not on file   Marital Status: Not on file  Intimate Partner Violence:    Fear of Current or Ex-Partner: Not on file   Emotionally Abused: Not on file   Physically Abused: Not on file   Sexually Abused: Not on file     BP 138/76    Pulse 75    Ht 5\' 10"  (1.778 m)    Wt 278 lb (126.1 kg)    SpO2 94%    BMI 39.89 kg/m   Physical Exam:  Well appearing NAD HEENT: Unremarkable Neck:  No JVD, no thyromegally Lymphatics:  No adenopathy Back:  No CVA tenderness Lungs:  Clear HEART:  Regular rate rhythm, no murmurs, no rubs, no clicks Abd:  soft, positive bowel sounds, no organomegally, no rebound, no guarding Ext:  2 plus pulses, no edema, no cyanosis, no clubbing Skin:  No rashes no nodules Neuro:  CN II through XII intact, motor grossly intact  Assess/Plan: 1. Palpitations - his symptoms are worse. I asked him to start toprol 25 mg daily with plans to uptitrate to 50 mg as needed. 2. Obesity - he has gained 15 lbs in the last year. I strongly encouraged him to lose weight. 3. HTN - his bp is elevated. Hopefully the toprol will help. 4. Dyspnea - he will avoid salty foods and work on weight loss.  Kambria Grima,MD

## 2019-12-30 DIAGNOSIS — E7800 Pure hypercholesterolemia, unspecified: Secondary | ICD-10-CM | POA: Diagnosis not present

## 2019-12-30 DIAGNOSIS — E119 Type 2 diabetes mellitus without complications: Secondary | ICD-10-CM | POA: Diagnosis not present

## 2019-12-30 DIAGNOSIS — I10 Essential (primary) hypertension: Secondary | ICD-10-CM | POA: Diagnosis not present

## 2020-01-27 DIAGNOSIS — Z299 Encounter for prophylactic measures, unspecified: Secondary | ICD-10-CM | POA: Diagnosis not present

## 2020-01-27 DIAGNOSIS — E1165 Type 2 diabetes mellitus with hyperglycemia: Secondary | ICD-10-CM | POA: Diagnosis not present

## 2020-01-27 DIAGNOSIS — J019 Acute sinusitis, unspecified: Secondary | ICD-10-CM | POA: Diagnosis not present

## 2020-01-27 DIAGNOSIS — I10 Essential (primary) hypertension: Secondary | ICD-10-CM | POA: Diagnosis not present

## 2020-03-24 IMAGING — CT CT HEAD W/O CM
5 of 7 series · 16 of 47 positions shown, 17 images · non-contrast
Comparison: Doppler ultrasound 07/18/2017.

CLINICAL DATA: 66-year-old male with neck and bilateral arm pain
for 1 month. Shortness of breath on exertion. A fib. SVT.

EXAM:
CT HEAD WITHOUT CONTRAST
CT CERVICAL SPINE WITHOUT CONTRAST
TECHNIQUE: Multidetector CT imaging of the head and cervical spine was
performed following the standard protocol without intravenous
contrast. Multiplanar CT image reconstructions of the cervical spine
were also generated.

[Series 6: head wo · axial · 0.50mm/px · z∈[+1503,+1558]mm · 2 of 33 slices shown, 3 images]
[im 11/33  brain]
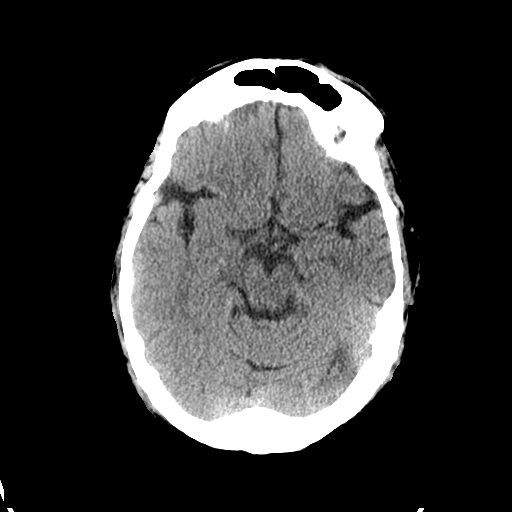
[im 11/33  bone]
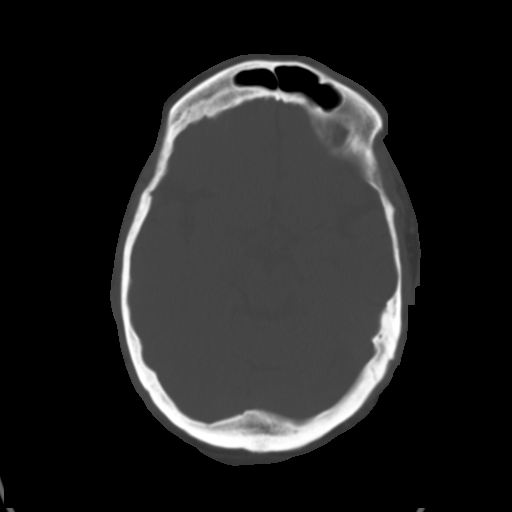
[im 22/33  brain]
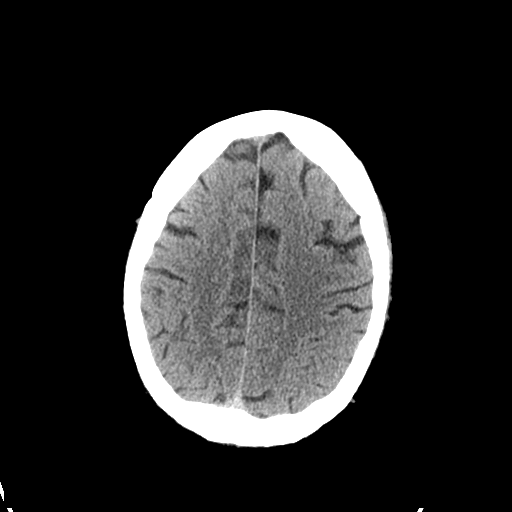

[Series 8: coronal soft tissue · coronal · 0.33mm/px · 3 of 71 slices shown]
[im 27/71  brain]
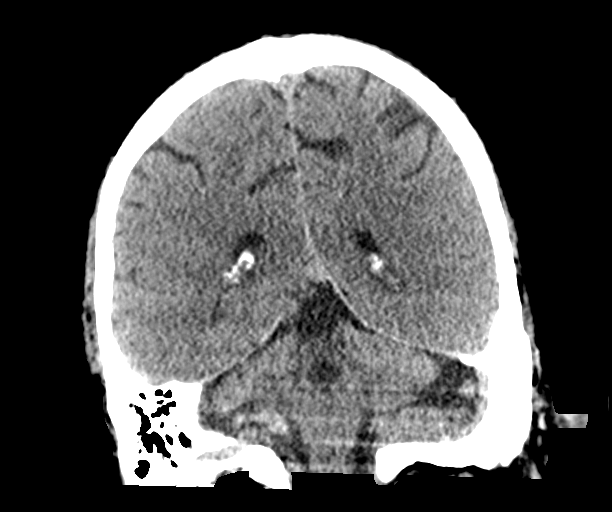
[im 36/71  brain]
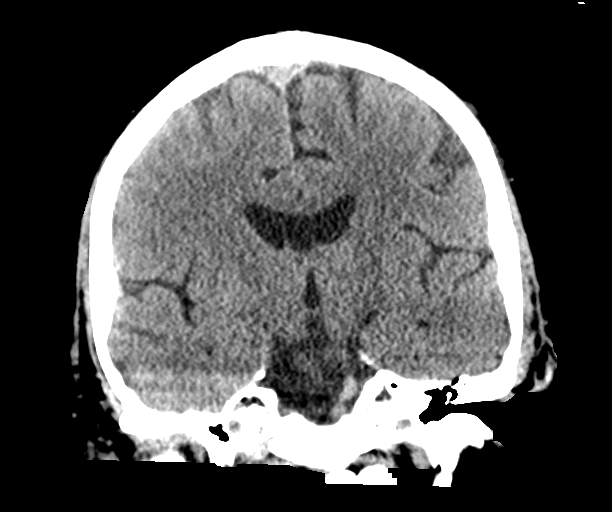
[im 44/71  brain]
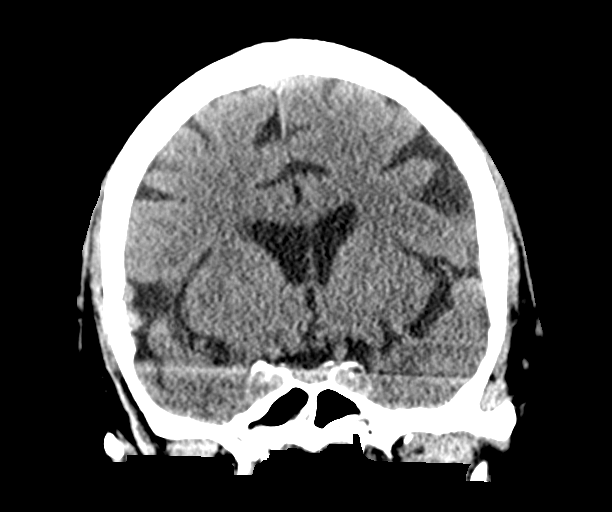

[Series 9: sagittal soft tissue · sagittal · 0.33mm/px · 1 of 59 slices shown]
[im 30/59  brain]
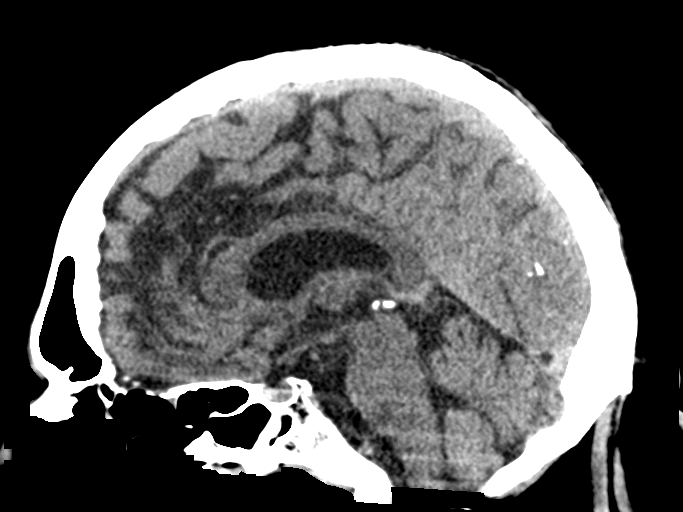

[Series 11: c spine soft · axial · 0.36mm/px · z∈[+1292,+1324]mm · 2 of 97 slices shown]
[im 9/97  brain]
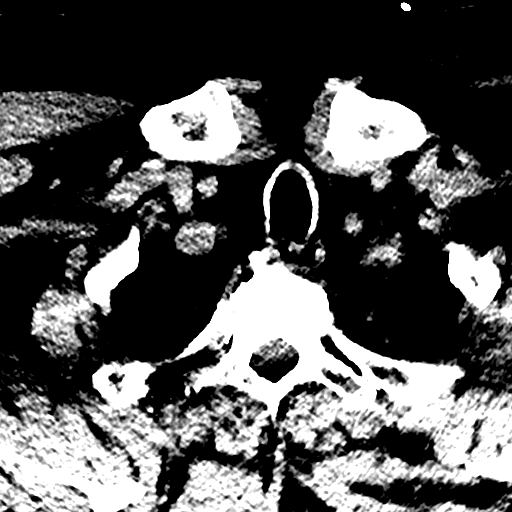
[im 25/97  brain]
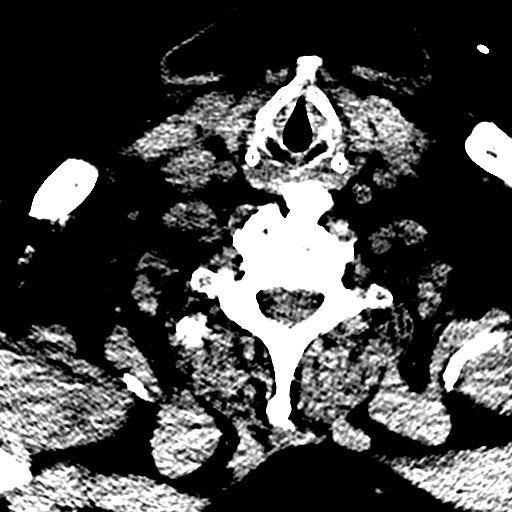

[Series 16: orthogonal bone · axial · 0.21mm/px · z∈[+1281,+1419]mm · 8 of 93 slices shown]
[im 8/93  bone]
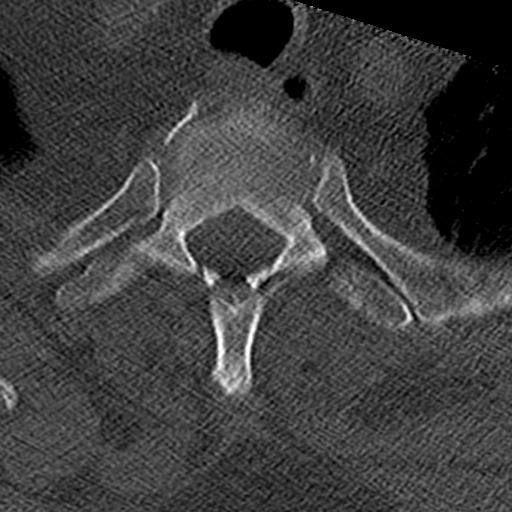
[im 24/93  bone]
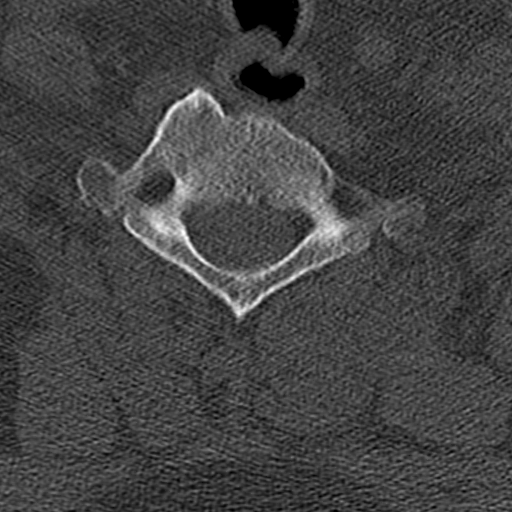
[im 31/93  bone]
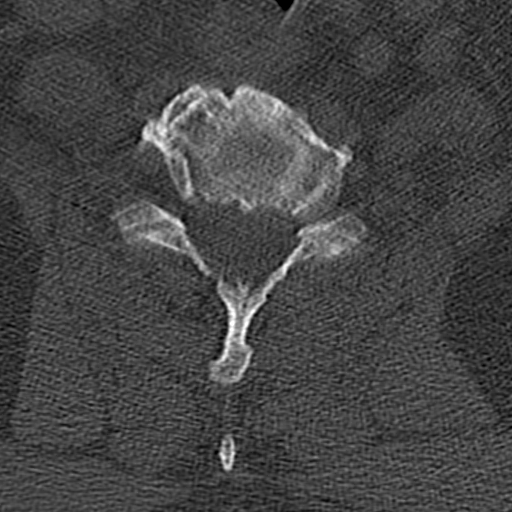
[im 39/93  bone]
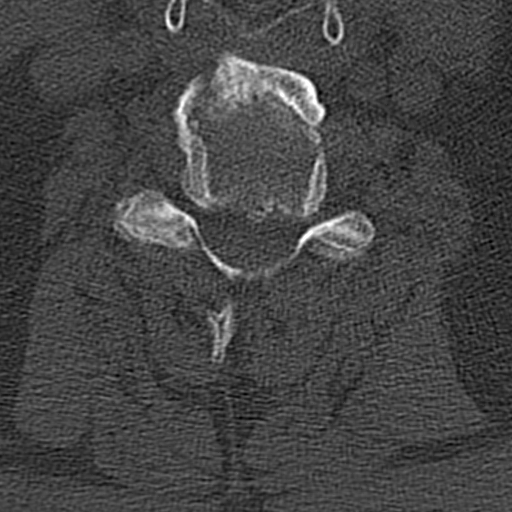
[im 54/93  bone]
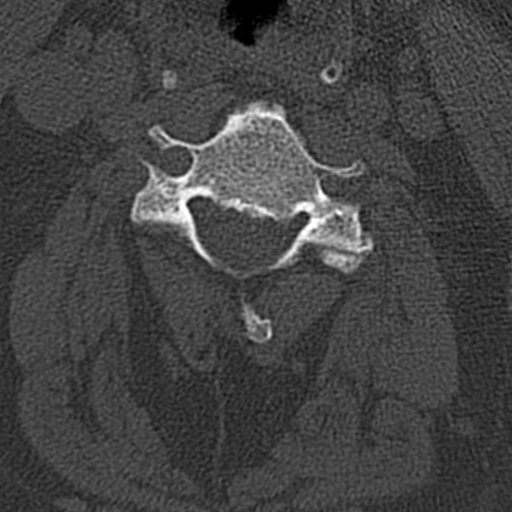
[im 62/93  bone]
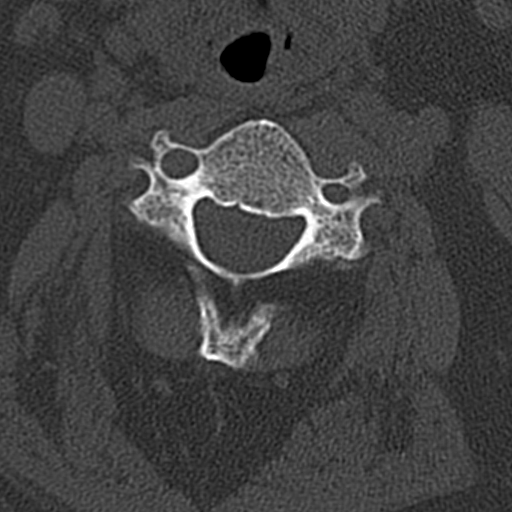
[im 70/93  bone]
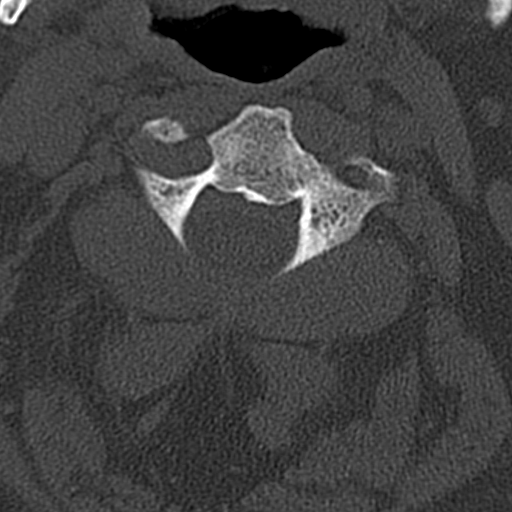
[im 85/93  bone]
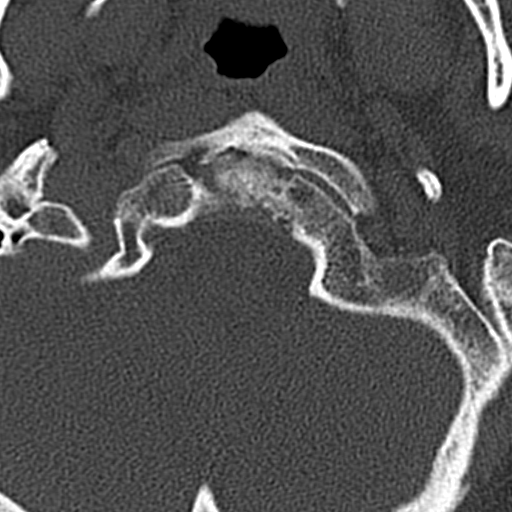

[16 of 47 positions shown; findings below may reference images not displayed]

FINDINGS: CT HEAD FINDINGS

Brain: Cerebral volume is within normal limits for age. No midline
shift, ventriculomegaly, mass effect, evidence of mass lesion,
intracranial hemorrhage or evidence of cortically based acute
infarction. Gray-white matter differentiation is within normal
limits for age. No cortical encephalomalacia identified.

Vascular: Mild Calcified atherosclerosis at the skull base. Dominant
distal right vertebral artery. No suspicious intracranial vascular
hyperdensity.

Skull: No acute osseous abnormality identified.

Sinuses/Orbits: Scattered mild to moderate bilateral paranasal sinus
mucosal thickening. Small mucous retention cyst in the right
maxillary sinus. The tympanic cavities and mastoids are clear.

Small volume retained secretions in the nasopharynx. Polypoid
posterior nasal cavity opacity (series 7, image 1).

Other: Visualized orbit soft tissues are within normal limits. No
acute scalp soft tissue finding identified. Suspected right
posterior convexity scalp soft tissue scarring on series 7, image
45. Underlying calvarium intact.

CT CERVICAL SPINE FINDINGS

Alignment: Straightening of cervical lordosis. Cervicothoracic
junction alignment is within normal limits. Bilateral posterior
element alignment is within normal limits.

Skull base and vertebrae: Visualized skull base is intact. No
atlanto-occipital dissociation. No cervical spine fracture
identified.

Soft tissues and spinal canal: No prevertebral fluid or swelling. No
visible canal hematoma. Negative noncontrast paraspinal soft
tissues.

Disc levels: Bulky osteophytosis including about the anterior
C1-odontoid, and also along the anterior vertebral endplates C4-C5
through C7-T1. Associated C6-C7 and possibly also C7-T1 interbody
ankylosis related to the bridging osteophytes. Relatively preserved
disc spaces, but superimposed circumferential endplate spurring.
Multilevel mild facet hypertrophy.

Overall there is up to mild degenerative lower cervical spinal
stenosis, maximal at C5-C6 and C6-C7.

Upper chest: The visible upper thoracic levels appear intact.
Negative lung apices. Negative noncontrast thoracic inlet.
IMPRESSION: 1. No acute traumatic injury identified in the head or cervical
spine.
2.  Normal for age non contrast CT appearance of the brain.
3. Cervical spine Diffuse idiopathic skeletal hyperostosis (DISH).
Bulky flowing anterior endplate osteophytes. Subsequent C6-C7 and
C7-T1 interbody ankylosis. Mild multifactorial lower cervical spinal
stenosis suspected.
4. Possible Sinonasal Polyposis. Mild-to-moderate sinonasal
inflammation.

## 2020-03-24 IMAGING — DX DG CHEST 2V
2 series · 2 of 2 positions shown · non-contrast
Comparison: PA and lateral chest 03/19/2017 and 11/20/2016.

CLINICAL DATA: Shortness of breath, chest pressure and right arm
pain for 8 months.

EXAM:
CHEST - 2 VIEW

[chest pa]
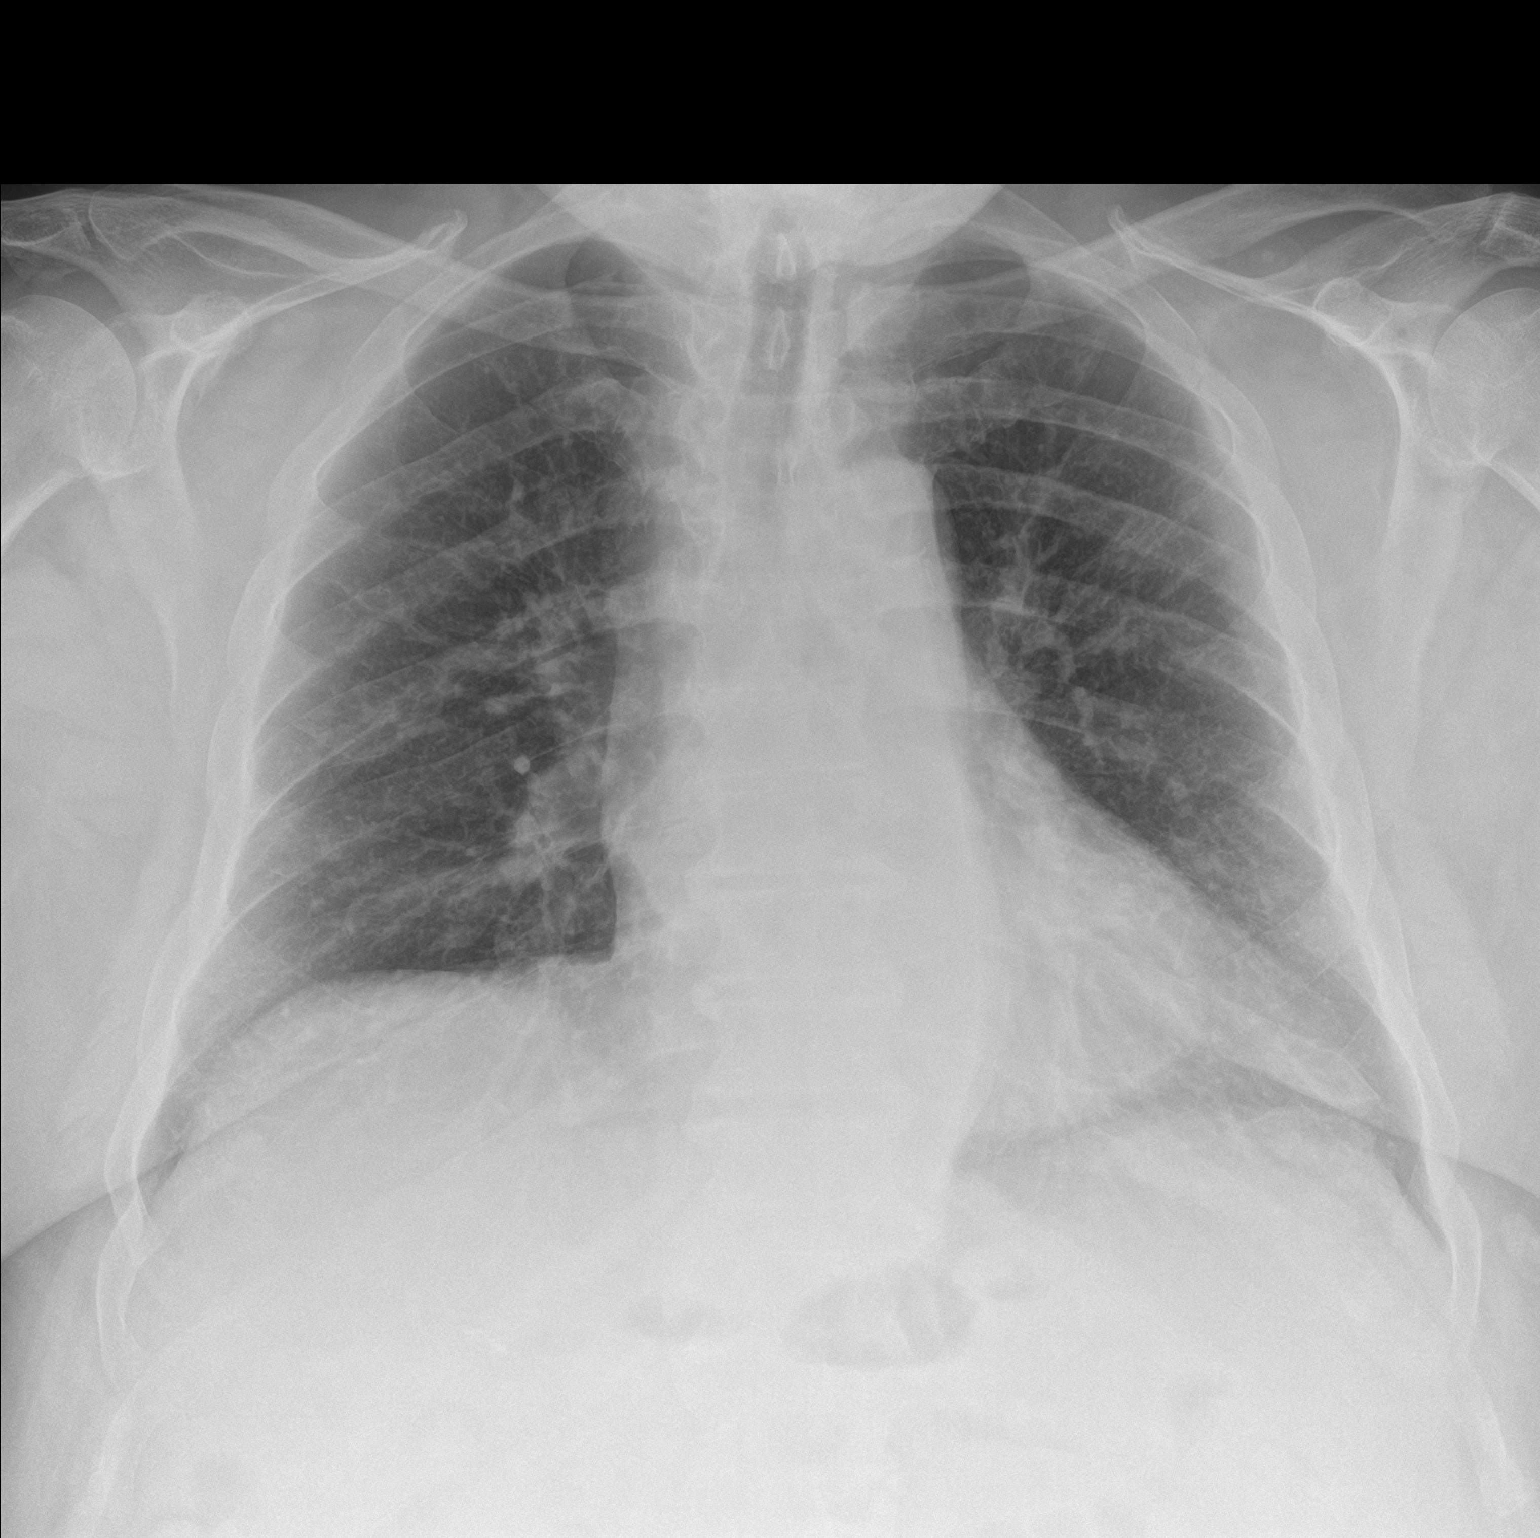

[chest lat]
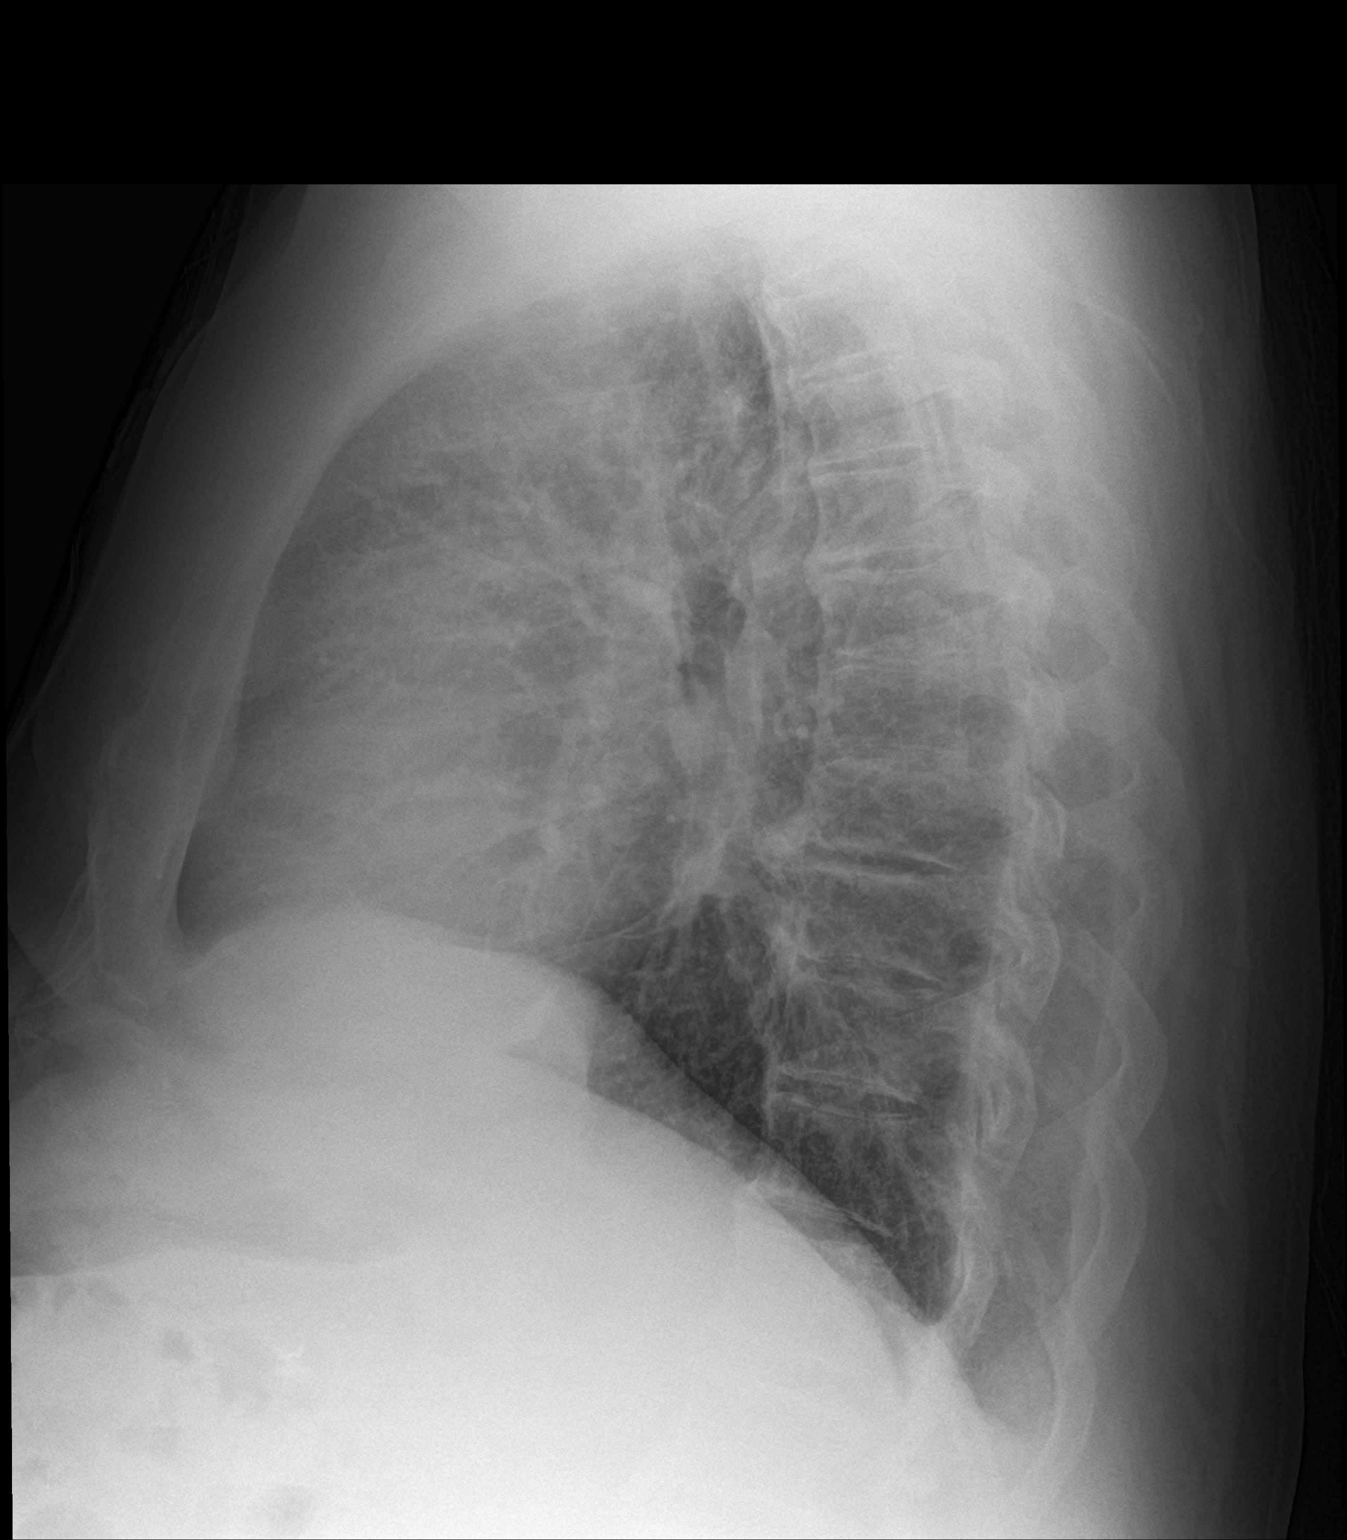

[2 of 2 positions shown; findings below may reference images not displayed]

FINDINGS: The lungs are clear. Heart size is normal. Aortic atherosclerosis is
noted. No pneumothorax or pleural effusion. No acute bony
abnormality.
IMPRESSION: No acute disease.

Atherosclerosis.

## 2020-03-30 ENCOUNTER — Ambulatory Visit (INDEPENDENT_AMBULATORY_CARE_PROVIDER_SITE_OTHER): Payer: Medicare Other | Admitting: Internal Medicine

## 2020-03-30 ENCOUNTER — Encounter: Payer: Self-pay | Admitting: Internal Medicine

## 2020-03-30 ENCOUNTER — Other Ambulatory Visit: Payer: Self-pay

## 2020-03-30 VITALS — BP 124/62 | HR 64 | Ht 70.0 in | Wt 262.4 lb

## 2020-03-30 DIAGNOSIS — I10 Essential (primary) hypertension: Secondary | ICD-10-CM | POA: Diagnosis not present

## 2020-03-30 NOTE — Patient Instructions (Signed)
Medication Instructions:  Your physician recommends that you continue on your current medications as directed. Please refer to the Current Medication list given to you today.  *If you need a refill on your cardiac medications before your next appointment, please call your pharmacy*   Lab Work: NONE   If you have labs (blood work) drawn today and your tests are completely normal, you will receive your results only by: . MyChart Message (if you have MyChart) OR . A paper copy in the mail If you have any lab test that is abnormal or we need to change your treatment, we will call you to review the results.   Testing/Procedures: NONE    Follow-Up: At [facility], you and your health needs are our priority.  As part of our continuing mission to provide you with exceptional heart care, we have created designated Provider Care Teams.  These Care Teams include your primary Cardiologist (physician) and Advanced Practice Providers (APPs -  Physician Assistants and Nurse Practitioners) who all work together to provide you with the care you need, when you need it.  We recommend signing up for the patient portal called "MyChart".  Sign up information is provided on this After Visit Summary.  MyChart is used to connect with patients for Virtual Visits (Telemedicine).  Patients are able to view lab/test results, encounter notes, upcoming appointments, etc.  Non-urgent messages can be sent to your provider as well.   To learn more about what you can do with MyChart, go to [email or web address].    Your next appointment:   1 year(s)  The format for your next appointment:   In Person  Provider:   Gregg Taylor, MD   Other Instructions Thank you for choosing [facility]!    

## 2020-03-30 NOTE — Progress Notes (Signed)
HPI Justin [name] returns today for followup. He is a pleasant 35 you man with PVC's, NSVT and NS AT. He has obesity. When I saw him last I recommended he start taking toprol. He has also started dieting and has lost 16 lbs. He has not had syncope. He still has palpitations but he does feel better. No chest pain or syncope. No edema.  Allergies  Allergen Reactions  . Halcion [Triazolam] Other (See Comments)    Broke the hospital bed     Current Outpatient Medications  Medication Sig Dispense Refill  . albuterol (VENTOLIN HFA) 108 (90 Base) MCG/ACT inhaler Inhale 2 puffs into the lungs 4 (four) times daily.    Marland Kitchen ALPRAZolam (XANAX) 1 MG tablet Take 1 mg by mouth 3 (three) times daily.     Marland Kitchen aspirin 81 MG EC tablet aspirin 81 mg tablet,delayed release  TAKE 1 TABLET BY MOUTH DAILY    . cetirizine (ZYRTEC) 10 MG tablet Take 10 mg by mouth daily.    . furosemide (LASIX) 40 MG tablet Take 40 mg by mouth 2 (two) times daily as needed (swelling feet/fluid retention.).     Marland Kitchen levothyroxine (SYNTHROID, LEVOTHROID) 125 MCG tablet Take 125 mcg by mouth daily before breakfast.   7  . losartan (COZAAR) 25 MG tablet Take 12.5 mg by mouth daily as needed (for blood pressure greater 140/70).     . metFORMIN (GLUCOPHAGE) 500 MG tablet Take 250 mg by mouth daily.    . metoprolol succinate (TOPROL XL) 25 MG 24 hr tablet Take 1 tablet (25 mg total) by mouth daily. 90 tablet 3  . nitroGLYCERIN (NITROSTAT) 0.4 MG SL tablet Place 1 tablet (0.4 mg total) under the tongue every 5 (five) minutes as needed for chest pain. 25 tablet 3  . PARoxetine (PAXIL) 20 MG tablet Take 10 mg by mouth daily.     . potassium chloride (KLOR-CON) 10 MEQ tablet TAKE 1 TABLET BY MOUTH EVERY DAY 90 tablet 2  . SYMBICORT 160-4.5 MCG/ACT inhaler Inhale 2 puffs into the lungs 2 (two) times daily.    . tamsulosin (FLOMAX) 0.4 MG CAPS capsule Take 0.4 mg by mouth daily.     No current facility-administered medications for this visit.      Past Medical History:  Diagnosis Date  . AF (atrial fibrillation) (HCC)   . Anxiety and depression   . Hypertension   . Hypothyroidism   . Obesity   . Palpitations   . SVT (supraventricular tachycardia) (HCC)     ROS:   All systems reviewed and negative except as noted in the HPI.   Past Surgical History:  Procedure Laterality Date  . CARDIOVASCULAR STRESS TEST  09/05/2007  . CHOLECYSTECTOMY    . CYSTOSCOPY WITH INSERTION OF UROLIFT N/A 08/13/2017   Procedure: CYSTOSCOPY WITH INSERTION OF UROLIFT;  Surgeon: Malen Gauze, MD;  Location: [facility];  Service: Urology;  Laterality: N/A;  . PAROTIDECTOMY  08/07/2018  . PAROTIDECTOMY Right 08/07/2018   Procedure: PAROTIDECTOMY;  Surgeon: Osborn Coho, MD;  Location: [facility] OR;  Service: ENT;  Laterality: Right;  . TONSILLECTOMY       Family History  Problem Relation Age of Onset  . Heart disease Mother   . Heart disease Father      Social History   Socioeconomic History  . Marital status: Married    Spouse name: Not on file  . Number of children: Not on file  . Years of education: Not  on file  . Highest education level: Not on file  Occupational History  . Not on file  Tobacco Use  . Smoking status: Former Games developer  . Smokeless tobacco: Former Neurosurgeon    Types: Snuff  Vaping Use  . Vaping Use: Never used  Substance and Sexual Activity  . Alcohol use: No  . Drug use: No  . Sexual activity: Not on file  Other Topics Concern  . Not on file  Social History Narrative  . Not on file   Social Determinants of Health   Financial Resource Strain: Not on file  Food Insecurity: Not on file  Transportation Needs: Not on file  Physical Activity: Not on file  Stress: Not on file  Social Connections: Not on file  Intimate Partner Violence: Not on file     BP 124/62   Pulse 64   Ht 5\' 10"  (1.778 m)   Wt 262 lb 6.4 oz (119 kg)   SpO2 97%   BMI 37.65 kg/m   Physical Exam:  Well appearing NAD HEENT:  Unremarkable Neck:  No JVD, no thyromegally Lymphatics:  No adenopathy Back:  No CVA tenderness Lungs:  Clear with no wheezes HEART:  Regular rate rhythm, no murmurs, no rubs, no clicks Abd:  soft, positive bowel sounds, no organomegally, no rebound, no guarding Ext:  2 plus pulses, no edema, no cyanosis, no clubbing Skin:  No rashes no nodules Neuro:  CN II through XII intact, motor grossly intact   Assess/Plan: 1. Atrial and ventricular ectopy - he will continue his beta blocker and I have recommended he take an extra toprol if needed for break through palpitations.  2. Obesity - he has lost 16 lbs and plans to try and to lose another 12.  3. Anxiety - he will continue his xanax 4. HTN - his bp is improved on the toprol and the weight loss. I encouraged him to avoid salty foods.  Anas Reister,MD

## 2020-04-22 DIAGNOSIS — E1165 Type 2 diabetes mellitus with hyperglycemia: Secondary | ICD-10-CM | POA: Diagnosis not present

## 2020-04-22 DIAGNOSIS — Z299 Encounter for prophylactic measures, unspecified: Secondary | ICD-10-CM | POA: Diagnosis not present

## 2020-04-22 DIAGNOSIS — R109 Unspecified abdominal pain: Secondary | ICD-10-CM | POA: Diagnosis not present

## 2020-04-22 DIAGNOSIS — I10 Essential (primary) hypertension: Secondary | ICD-10-CM | POA: Diagnosis not present

## 2020-04-22 DIAGNOSIS — R1912 Hyperactive bowel sounds: Secondary | ICD-10-CM | POA: Diagnosis not present

## 2020-04-22 DIAGNOSIS — E1142 Type 2 diabetes mellitus with diabetic polyneuropathy: Secondary | ICD-10-CM | POA: Diagnosis not present

## 2020-04-22 DIAGNOSIS — J329 Chronic sinusitis, unspecified: Secondary | ICD-10-CM | POA: Diagnosis not present

## 2020-04-29 DIAGNOSIS — G250 Essential tremor: Secondary | ICD-10-CM | POA: Diagnosis not present

## 2020-04-29 DIAGNOSIS — H9319 Tinnitus, unspecified ear: Secondary | ICD-10-CM | POA: Diagnosis not present

## 2020-04-29 DIAGNOSIS — E1165 Type 2 diabetes mellitus with hyperglycemia: Secondary | ICD-10-CM | POA: Diagnosis not present

## 2020-04-29 DIAGNOSIS — I10 Essential (primary) hypertension: Secondary | ICD-10-CM | POA: Diagnosis not present

## 2020-04-29 DIAGNOSIS — E039 Hypothyroidism, unspecified: Secondary | ICD-10-CM | POA: Diagnosis not present

## 2020-04-29 DIAGNOSIS — Z299 Encounter for prophylactic measures, unspecified: Secondary | ICD-10-CM | POA: Diagnosis not present

## 2020-05-06 DIAGNOSIS — H905 Unspecified sensorineural hearing loss: Secondary | ICD-10-CM | POA: Diagnosis not present

## 2020-07-25 ENCOUNTER — Other Ambulatory Visit: Payer: Self-pay | Admitting: Internal Medicine

## 2020-08-05 DIAGNOSIS — E1142 Type 2 diabetes mellitus with diabetic polyneuropathy: Secondary | ICD-10-CM | POA: Diagnosis not present

## 2020-08-05 DIAGNOSIS — Z299 Encounter for prophylactic measures, unspecified: Secondary | ICD-10-CM | POA: Diagnosis not present

## 2020-08-05 DIAGNOSIS — E1165 Type 2 diabetes mellitus with hyperglycemia: Secondary | ICD-10-CM | POA: Diagnosis not present

## 2020-08-05 DIAGNOSIS — J441 Chronic obstructive pulmonary disease with (acute) exacerbation: Secondary | ICD-10-CM | POA: Diagnosis not present

## 2020-08-05 DIAGNOSIS — I10 Essential (primary) hypertension: Secondary | ICD-10-CM | POA: Diagnosis not present

## 2020-08-05 DIAGNOSIS — Z87891 Personal history of nicotine dependence: Secondary | ICD-10-CM | POA: Diagnosis not present

## 2020-09-13 DIAGNOSIS — R52 Pain, unspecified: Secondary | ICD-10-CM | POA: Diagnosis not present

## 2020-09-13 DIAGNOSIS — R5383 Other fatigue: Secondary | ICD-10-CM | POA: Diagnosis not present

## 2020-09-13 DIAGNOSIS — Z7189 Other specified counseling: Secondary | ICD-10-CM | POA: Diagnosis not present

## 2020-09-13 DIAGNOSIS — G720 Drug-induced myopathy: Secondary | ICD-10-CM | POA: Diagnosis not present

## 2020-09-13 DIAGNOSIS — E039 Hypothyroidism, unspecified: Secondary | ICD-10-CM | POA: Diagnosis not present

## 2020-09-13 DIAGNOSIS — Z299 Encounter for prophylactic measures, unspecified: Secondary | ICD-10-CM | POA: Diagnosis not present

## 2020-09-13 DIAGNOSIS — Z79899 Other long term (current) drug therapy: Secondary | ICD-10-CM | POA: Diagnosis not present

## 2020-09-13 DIAGNOSIS — I10 Essential (primary) hypertension: Secondary | ICD-10-CM | POA: Diagnosis not present

## 2020-09-13 DIAGNOSIS — Z0000 Encounter for general adult medical examination without abnormal findings: Secondary | ICD-10-CM | POA: Diagnosis not present

## 2020-09-13 DIAGNOSIS — E7800 Pure hypercholesterolemia, unspecified: Secondary | ICD-10-CM | POA: Diagnosis not present

## 2020-09-13 DIAGNOSIS — T466X5A Adverse effect of antihyperlipidemic and antiarteriosclerotic drugs, initial encounter: Secondary | ICD-10-CM | POA: Diagnosis not present

## 2020-10-22 ENCOUNTER — Other Ambulatory Visit: Payer: Self-pay | Admitting: Internal Medicine

## 2020-10-26 DIAGNOSIS — E119 Type 2 diabetes mellitus without complications: Secondary | ICD-10-CM | POA: Diagnosis not present

## 2020-11-10 ENCOUNTER — Encounter: Payer: Self-pay | Admitting: Urology

## 2020-11-10 ENCOUNTER — Other Ambulatory Visit: Payer: Self-pay

## 2020-11-10 ENCOUNTER — Encounter: Payer: Self-pay | Admitting: Family Medicine

## 2020-11-10 ENCOUNTER — Ambulatory Visit (INDEPENDENT_AMBULATORY_CARE_PROVIDER_SITE_OTHER): Payer: Medicare Other | Admitting: Urology

## 2020-11-10 VITALS — BP 114/66 | HR 72 | Ht 70.0 in | Wt 260.0 lb

## 2020-11-10 DIAGNOSIS — K4090 Unilateral inguinal hernia, without obstruction or gangrene, not specified as recurrent: Secondary | ICD-10-CM | POA: Diagnosis not present

## 2020-11-10 DIAGNOSIS — R3912 Poor urinary stream: Secondary | ICD-10-CM

## 2020-11-10 DIAGNOSIS — N50819 Testicular pain, unspecified: Secondary | ICD-10-CM | POA: Diagnosis not present

## 2020-11-10 LAB — MICROSCOPIC EXAMINATION
Bacteria, UA: NONE SEEN
RBC, Urine: >30 /hpf — AB (ref 0–2)
Renal Epithel, UA: NONE SEEN /hpf
WBC, UA: NONE SEEN /hpf (ref 0–5)

## 2020-11-10 LAB — URINALYSIS, ROUTINE W REFLEX MICROSCOPIC
Bilirubin, UA: NEGATIVE
Glucose, UA: NEGATIVE
Ketones, UA: NEGATIVE
Leukocytes,UA: NEGATIVE
Nitrite, UA: NEGATIVE
Protein,UA: NEGATIVE
Specific Gravity, UA: 1.015 (ref 1.005–1.030)
Urobilinogen, Ur: 0.2 mg/dL (ref 0.2–1.0)
pH, UA: 5.5 (ref 5.0–7.5)

## 2020-11-10 MED ORDER — SULFAMETHOXAZOLE-TRIMETHOPRIM 800-160 MG PO TABS: 1.0000 | ORAL_TABLET | Freq: Two times a day (BID) | ORAL | 0 refills | Status: DC

## 2020-11-10 NOTE — Progress Notes (Signed)
11/10/2020 9:31 AM   Justin [name] [birth year] [id number]  Referring provider: Kirstie Peri, MD [address] [place],  [place] [address]  Right testis pain   HPI: Justin [name] is a 69yo here for evaluation of a right testis pain and weak urinary stream. IPSS 3 QOL 0. He has an intermittent weak stream. Nocturia 0x. For the past 6 months he has noted right testis pain with associated mass in his right scrotum. The pain is dull intermittent, moderate and nonraditing. He has right testis pain with ambulation. He has difficulty lifting objects due to the pain .   PMH: Past Medical History:  Diagnosis Date   AF (atrial fibrillation) (HCC)    Anxiety and depression    Hypertension    Hypothyroidism    Obesity    Palpitations    SVT (supraventricular tachycardia) (HCC)     Surgical History: Past Surgical History:  Procedure Laterality Date   CARDIOVASCULAR STRESS TEST  09/05/2007   CHOLECYSTECTOMY     CYSTOSCOPY WITH INSERTION OF UROLIFT N/A 08/13/2017   Procedure: CYSTOSCOPY WITH INSERTION OF UROLIFT;  Surgeon: Justin Gauze, MD;  Location: [facility];  Service: Urology;  Laterality: N/A;   PAROTIDECTOMY  08/07/2018   PAROTIDECTOMY Right 08/07/2018   Procedure: PAROTIDECTOMY;  Surgeon: Justin Coho, MD;  Location: [facility] OR;  Service: ENT;  Laterality: Right;   TONSILLECTOMY      Home Medications:  Allergies as of 11/10/2020       Reactions   Halcion [triazolam] Other (See Comments)   Broke the hospital bed        Medication List        Accurate as of November 10, 2020  9:31 AM. If you have any questions, ask your nurse or doctor.          STOP taking these medications    aspirin 81 MG EC tablet Stopped by: Justin Aye, MD   losartan 25 MG tablet Commonly known as: COZAAR Stopped by: Justin Aye, MD       TAKE these medications    albuterol 108 (90 Base) MCG/ACT inhaler Commonly known as: VENTOLIN HFA Inhale 2 puffs into the lungs 4 (four)  times daily.   ALPRAZolam 1 MG tablet Commonly known as: XANAX Take 1 mg by mouth 3 (three) times daily.   cetirizine 10 MG tablet Commonly known as: ZYRTEC Take 10 mg by mouth daily.   furosemide 40 MG tablet Commonly known as: LASIX Take 40 mg by mouth 2 (two) times daily as needed (swelling feet/fluid retention.).   levothyroxine 125 MCG tablet Commonly known as: SYNTHROID Take 125 mcg by mouth daily before breakfast.   metFORMIN 500 MG tablet Commonly known as: GLUCOPHAGE Take 250 mg by mouth daily.   metoprolol succinate 25 MG 24 hr tablet Commonly known as: TOPROL-XL TAKE 1 TABLET BY MOUTH DAILY   nitroGLYCERIN 0.4 MG SL tablet Commonly known as: NITROSTAT Place 1 tablet (0.4 mg total) under the tongue every 5 (five) minutes as needed for chest pain.   PARoxetine 20 MG tablet Commonly known as: PAXIL Take 10 mg by mouth daily.   potassium chloride 10 MEQ tablet Commonly known as: KLOR-CON TAKE 1 TABLET BY MOUTH EVERY DAY   Symbicort 160-4.5 MCG/ACT inhaler Generic drug: budesonide-formoterol Inhale 2 puffs into the lungs 2 (two) times daily.   tamsulosin 0.4 MG Caps capsule Commonly known as: FLOMAX Take 0.4 mg by mouth daily.        Allergies:  Allergies  Allergen Reactions   Halcion [Triazolam] Other (See Comments)    Broke the hospital bed    Family History: Family History  Problem Relation Age of Onset   Heart disease Mother    Heart disease Father     Social History:  reports that he has quit smoking. He has quit using smokeless tobacco.  His smokeless tobacco use included snuff. He reports that he does not drink alcohol and does not use drugs.  ROS: All other review of systems were reviewed and are negative except what is noted above in HPI  Physical Exam: BP 114/66   Pulse 72   Ht 5\' 10"  (1.778 m)   Wt 260 lb (117.9 kg)   BMI 37.31 kg/m   Constitutional:  Alert and oriented, No acute distress. HEENT: Justin [name], moist mucus  membranes.  Trachea midline, no masses. Cardiovascular: No clubbing, cyanosis, or edema. Respiratory: Normal respiratory effort, no increased work of breathing. GI: Abdomen is soft, nontender, nondistended, no abdominal masses GU: No CVA tenderness. Circumcised phallus. No masses/lesions on penis, testis, scrotum. Right 0.40mm epididymal head cyst Right inguinal hernia Lymph: No cervical or inguinal lymphadenopathy. Skin: No rashes, bruises or suspicious lesions. Neurologic: Grossly intact, no focal deficits, moving all 4 extremities. Psychiatric: Normal mood and affect.  Laboratory Data: Lab Results  Component Value Date   WBC 7.1 11/10/2019   HGB 15.9 11/10/2019   HCT 46.3 11/10/2019   MCV 98.3 11/10/2019   PLT 125 (L) 11/10/2019    Lab Results  Component Value Date   CREATININE 1.18 11/10/2019    No results found for: PSA  No results found for: TESTOSTERONE  No results found for: HGBA1C  Urinalysis    Component Value Date/Time   COLORURINE YELLOW 11/20/2016 2039   APPEARANCEUR CLEAR 11/20/2016 2039   LABSPEC 1.015 11/20/2016 2039   PHURINE 5.0 11/20/2016 2039   GLUCOSEU NEGATIVE 11/20/2016 2039   HGBUR NEGATIVE 11/20/2016 2039   BILIRUBINUR NEGATIVE 11/20/2016 2039   KETONESUR NEGATIVE 11/20/2016 2039   PROTEINUR NEGATIVE 11/20/2016 2039   NITRITE NEGATIVE 11/20/2016 2039   LEUKOCYTESUR NEGATIVE 11/20/2016 2039    No results found for: LABMICR, WBCUA, RBCUA, LABEPIT, MUCUS, BACTERIA  Pertinent Imaging:  No results found for this or any previous visit.  No results found for this or any previous visit.  No results found for this or any previous visit.  No results found for this or any previous visit.  No results found for this or any previous visit.  No results found for this or any previous visit.  No results found for this or any previous visit.  No results found for this or any previous visit.   Assessment & Plan:    1. Weak urinary  stream -improved after urolift. Patient is not bothered by his LUTS - Urinalysis, Routine w reflex microscopic  2. Pain in testicle, unspecified laterality -Likely related to right inguinal hernia. I will refer his to Dr. 2040.    No follow-ups on file.  Justin Sheehan, MD  [facility] [place]

## 2020-11-10 NOTE — Progress Notes (Signed)
Urological Symptom Review  Patient is experiencing the following symptoms: Testicle pain Weak stream at times   Review of Systems  Gastrointestinal (upper)  : Negative for upper GI symptoms  Gastrointestinal (lower) : Negative for lower GI symptoms  Constitutional : Negative for symptoms  Skin: Negative for skin symptoms  Eyes: Negative for eye symptoms  Ear/Nose/Throat : Negative for Ear/Nose/Throat symptoms  Hematologic/Lymphatic: Negative for Hematologic/Lymphatic symptoms  Cardiovascular : Negative for cardiovascular symptoms  Respiratory : Negative for respiratory symptoms  Endocrine: Negative for endocrine symptoms  Musculoskeletal: Negative for musculoskeletal symptoms  Neurological: Negative for neurological symptoms  Psychologic: Negative for psychiatric symptoms

## 2020-11-10 NOTE — Patient Instructions (Signed)
Hernia, Adult   A hernia is the bulging of an organ or tissue through a weak spot in the muscles of the abdomen (abdominal wall). Hernias develop most often near the belly button (navel) or the area where the leg meets the lower abdomen (groin). Common types of hernias include: Incisional hernia. This type bulges through a scar from an abdominal surgery. Umbilical hernia. This type develops near the navel. Inguinal hernia. This type develops in the groin or scrotum. Femoral hernia. This type develops under the groin, in the upper thigh area. Hiatal hernia. This type occurs when part of the stomach slides above the muscle that separates the abdomen from the chest (diaphragm). What are the causes? This condition may be caused by: Heavy lifting. Coughing over a long period of time. Straining to have a bowel movement. Constipation can lead to straining. An incision made during an abdominal surgery. A physical problem that is present at birth (congenital defect). Being overweight or obese. Smoking. Excess fluid in the abdomen. Undescended testicles in males. What are the signs or symptoms? The main symptom is a skin-colored, rounded bulge in the area of the hernia. However, a bulge may not always be present. It may grow bigger or be more visible when you cough or strain (such as when lifting something heavy). A hernia that can be pushed back into the area (is reducible) rarely causes pain. A hernia that cannot be pushed back into the area (is incarcerated) may lose its blood supply (become strangulated). A hernia that is incarcerated may cause: Pain. Fever. Nausea and vomiting. Swelling. Constipation. How is this diagnosed? A hernia may be diagnosed based on: Your symptoms and medical history. A physical exam. Your health care provider may ask you to cough or move in certain ways to see if the hernia becomes visible. Imaging tests, such as: X-rays. Ultrasound. CT scan. How is this  treated? A hernia that is small and painless may not need to be treated. A hernia that is large or painful may be treated with surgery. Inguinal hernias may be treated with surgery to prevent incarceration or strangulation. Strangulated hernias are always treated with surgery because a lack of blood supply to the trapped organ or tissue can cause it to die. Surgery to treat a hernia involves pushing the bulge back into place and repairing the weak area of the muscle or abdominal wall. Follow these instructions at home: Activity Avoid straining. Do not lift anything that is heavier than 10 lb (4.5 kg), or the limit that you are told, until your health care provider says that it is safe. When lifting heavy objects, lift with your leg muscles, not your back muscles. Preventing constipation Take actions to prevent constipation. Constipation leads to straining with bowel movements, which can make a hernia worse or cause a hernia repair to break down. Your health care provider may recommend that you: Drink enough fluid to keep your urine pale yellow. Eat foods that are high in fiber, such as fresh fruits and vegetables, whole grains, and beans. Limit foods that are high in fat and processed sugars, such as fried or sweet foods. Take an over-the-counter or prescription medicine for constipation. General instructions When coughing, try to cough gently. You may try to push the hernia back in place by very gently pressing on it while lying down. Do not try to force the bulge back in if it will not push in easily. If you are overweight, work with your health care provider to lose   weight safely. Do not use any products that contain nicotine or tobacco, such as cigarettes and e-cigarettes. If you need help quitting, ask your health care provider. If you are scheduled for hernia repair, watch your hernia for any changes in shape, size, or color. Tell your health care provider about any changes or new  symptoms. Take over-the-counter and prescription medicines only as told by your health care provider. Keep all follow-up visits as told by your health care provider. This is important. Contact a health care provider if: You develop new pain, swelling, or redness around your hernia. You have signs of constipation, such as: Fewer bowel movements in a week than normal. Difficulty having a bowel movement. Stools that are dry, hard, or larger than normal. Get help right away if: You have a fever. You have abdomen pain that gets worse. You feel nauseous or you vomit. You cannot push the hernia back in place by very gently pressing on it while lying down. Do not try to force the bulge back in if it will not push in easily. The hernia: Changes in shape, size, or color. Feels hard or tender. These symptoms may represent a serious problem that is an emergency. Do not wait to see if the symptoms will go away. Get medical help right away. Call your local emergency services (911 in the [place] Summary A hernia is the bulging of an organ or tissue through a weak spot in the muscles of the abdomen (abdominal wall). The main symptom is a skin-colored, rounded lump (bulge) in the hernia area. However, a bulge may not always be present. It may grow bigger or more visible when you cough or strain (such as when having a bowel movement). A hernia that is small and painless may not need to be treated. A hernia that is large or painful may be treated with surgery. Surgery to treat a hernia involves pushing the bulge back into place and repairing the weak part of the abdomen. This information is not intended to replace advice given to you by your health care provider. Make sure you discuss any questions you have with your health care provider. Document Revised: 05/09/2018 Document Reviewed: 10/18/2016 Elsevier Patient Education  2021 [employer]  

## 2020-11-30 ENCOUNTER — Ambulatory Visit: Payer: Medicare Other | Admitting: General Surgery

## 2020-12-16 ENCOUNTER — Ambulatory Visit (INDEPENDENT_AMBULATORY_CARE_PROVIDER_SITE_OTHER): Payer: Medicare Other | Admitting: General Surgery

## 2020-12-16 ENCOUNTER — Other Ambulatory Visit: Payer: Self-pay

## 2020-12-16 ENCOUNTER — Encounter: Payer: Self-pay | Admitting: General Surgery

## 2020-12-16 VITALS — BP 128/77 | HR 70 | Temp 98.2°F | Resp 18 | Ht 70.0 in | Wt 256.0 lb

## 2020-12-16 DIAGNOSIS — K4090 Unilateral inguinal hernia, without obstruction or gangrene, not specified as recurrent: Secondary | ICD-10-CM

## 2020-12-17 NOTE — Progress Notes (Signed)
Justin [name]; [id number]; 1950/12/05   HPI Is a 70 year old white male who was referred to my care by Drs. Sherryll Burger and McKenzie for evaluation treatment of a right inguinal hernia.  This was found on work-up of right testicular pain.  Patient states he has had this for many months but seems to be worsening when standing.  It is also made worse with straining.  No nausea or vomiting have been noted.  The pain radiates from the right groin region down to the testicle.  Patient currently has bronchitis and thinks he is a little bit more short of breath than usual. Past Medical History:  Diagnosis Date   AF (atrial fibrillation) (HCC)    Anxiety and depression    Hypertension    Hypothyroidism    Obesity    Palpitations    SVT (supraventricular tachycardia) (HCC)     Past Surgical History:  Procedure Laterality Date   CARDIOVASCULAR STRESS TEST  09/05/2007   CHOLECYSTECTOMY     CYSTOSCOPY WITH INSERTION OF UROLIFT N/A 08/13/2017   Procedure: CYSTOSCOPY WITH INSERTION OF UROLIFT;  Surgeon: Malen Gauze, MD;  Location: [facility];  Service: Urology;  Laterality: N/A;   PAROTIDECTOMY  08/07/2018   PAROTIDECTOMY Right 08/07/2018   Procedure: PAROTIDECTOMY;  Surgeon: Osborn Coho, MD;  Location: [facility] OR;  Service: ENT;  Laterality: Right;   TONSILLECTOMY      Family History  Problem Relation Age of Onset   Heart disease Mother    Heart disease Father     Current Outpatient Medications on File Prior to Visit  Medication Sig Dispense Refill   albuterol (VENTOLIN HFA) 108 (90 Base) MCG/ACT inhaler Inhale 2 puffs into the lungs 4 (four) times daily.     ALPRAZolam (XANAX) 1 MG tablet Take 1 mg by mouth 3 (three) times daily.      cetirizine (ZYRTEC) 10 MG tablet Take 10 mg by mouth daily.     furosemide (LASIX) 40 MG tablet Take 40 mg by mouth 2 (two) times daily as needed (swelling feet/fluid retention.).      levothyroxine (SYNTHROID, LEVOTHROID) 125 MCG tablet Take 125 mcg by mouth  daily before breakfast.   7   metFORMIN (GLUCOPHAGE) 500 MG tablet Take 250 mg by mouth daily.     metoprolol succinate (TOPROL-XL) 25 MG 24 hr tablet TAKE 1 TABLET BY MOUTH DAILY 90 tablet 1   nitroGLYCERIN (NITROSTAT) 0.4 MG SL tablet Place 1 tablet (0.4 mg total) under the tongue every 5 (five) minutes as needed for chest pain. 25 tablet 3   PARoxetine (PAXIL) 20 MG tablet Take 10 mg by mouth daily.      potassium chloride (KLOR-CON) 10 MEQ tablet TAKE 1 TABLET BY MOUTH EVERY DAY 90 tablet 2   SYMBICORT 160-4.5 MCG/ACT inhaler Inhale 2 puffs into the lungs 2 (two) times daily.     tamsulosin (FLOMAX) 0.4 MG CAPS capsule Take 0.4 mg by mouth daily.     No current facility-administered medications on file prior to visit.    Allergies  Allergen Reactions   Halcion [Triazolam] Other (See Comments)    Broke the hospital bed    Social History   Substance and Sexual Activity  Alcohol Use No    Social History   Tobacco Use  Smoking Status Former  Smokeless Tobacco Former   Types: Snuff    Review of Systems  Constitutional:  Positive for malaise/fatigue.  HENT:  Positive for ear pain and sinus pain.   Eyes:  Negative.   Respiratory:  Positive for cough, shortness of breath and wheezing.   Cardiovascular: Negative.   Gastrointestinal:  Positive for abdominal pain.  Genitourinary:  Positive for frequency.  Musculoskeletal:  Positive for back pain, joint pain and neck pain.  Skin: Negative.   Neurological:  Positive for tremors and sensory change.  Endo/Heme/Allergies: Negative.   Psychiatric/Behavioral: Negative.     Objective   Vitals:   12/16/20 1135  BP: 128/77  Pulse: 70  Resp: 18  Temp: 98.2 F (36.8 C)  SpO2: 94%    Physical Exam Vitals reviewed.  Constitutional:      Appearance: Normal appearance. He is not ill-appearing.  HENT:     Head: Normocephalic and atraumatic.  Cardiovascular:     Rate and Rhythm: Normal rate and regular rhythm.     Heart  sounds: Normal heart sounds. No murmur heard.   No friction rub. No gallop.  Pulmonary:     Effort: No respiratory distress.     Breath sounds: No stridor. No wheezing, rhonchi or rales.     Comments: Decreased breath sounds noted bilaterally. Abdominal:     General: There is no distension.     Palpations: Abdomen is soft. There is no mass.     Tenderness: There is no abdominal tenderness. There is no guarding or rebound.     Hernia: A hernia is present.     Comments: Pain over the internal ring on the right side.  A reducible hernia is present.  Skin:    General: Skin is warm and dry.  Neurological:     Mental Status: He is alert and oriented to person, place, and time.   Urology notes reviewed Assessment  Right inguinal hernia Plan  Patient will be scheduled for right inguinal herniorrhaphy with mesh once he has been cleared by his primary care physician for his apparent acute on chronic shortness of breath secondary to bronchitis.  The risks and benefits of the procedure including bleeding, infection, mesh use, and the possibility of recurrence of the hernia were fully explained to the patient, who gave informed consent.  He will be contacting his primary care physician for work-up of his pulmonary status.

## 2021-01-04 DIAGNOSIS — R052 Subacute cough: Secondary | ICD-10-CM | POA: Diagnosis not present

## 2021-01-04 DIAGNOSIS — I440 Atrioventricular block, first degree: Secondary | ICD-10-CM | POA: Diagnosis not present

## 2021-01-04 DIAGNOSIS — Z7984 Long term (current) use of oral hypoglycemic drugs: Secondary | ICD-10-CM | POA: Diagnosis not present

## 2021-01-04 DIAGNOSIS — R059 Cough, unspecified: Secondary | ICD-10-CM | POA: Diagnosis not present

## 2021-01-04 DIAGNOSIS — Z87891 Personal history of nicotine dependence: Secondary | ICD-10-CM | POA: Diagnosis not present

## 2021-01-04 DIAGNOSIS — R0602 Shortness of breath: Secondary | ICD-10-CM | POA: Diagnosis not present

## 2021-01-04 DIAGNOSIS — Z20822 Contact with and (suspected) exposure to covid-19: Secondary | ICD-10-CM | POA: Diagnosis not present

## 2021-01-04 DIAGNOSIS — E119 Type 2 diabetes mellitus without complications: Secondary | ICD-10-CM | POA: Diagnosis not present

## 2021-01-05 ENCOUNTER — Ambulatory Visit: Payer: Medicare Other | Admitting: Urology

## 2021-01-25 DIAGNOSIS — E1142 Type 2 diabetes mellitus with diabetic polyneuropathy: Secondary | ICD-10-CM | POA: Diagnosis not present

## 2021-01-25 DIAGNOSIS — J449 Chronic obstructive pulmonary disease, unspecified: Secondary | ICD-10-CM | POA: Diagnosis not present

## 2021-01-25 DIAGNOSIS — Z299 Encounter for prophylactic measures, unspecified: Secondary | ICD-10-CM | POA: Diagnosis not present

## 2021-01-25 DIAGNOSIS — E1165 Type 2 diabetes mellitus with hyperglycemia: Secondary | ICD-10-CM | POA: Diagnosis not present

## 2021-01-25 DIAGNOSIS — I10 Essential (primary) hypertension: Secondary | ICD-10-CM | POA: Diagnosis not present

## 2021-02-01 DIAGNOSIS — I10 Essential (primary) hypertension: Secondary | ICD-10-CM | POA: Diagnosis not present

## 2021-02-01 DIAGNOSIS — Z87891 Personal history of nicotine dependence: Secondary | ICD-10-CM | POA: Diagnosis not present

## 2021-02-01 DIAGNOSIS — E1165 Type 2 diabetes mellitus with hyperglycemia: Secondary | ICD-10-CM | POA: Diagnosis not present

## 2021-02-01 DIAGNOSIS — Z299 Encounter for prophylactic measures, unspecified: Secondary | ICD-10-CM | POA: Diagnosis not present

## 2021-02-01 DIAGNOSIS — E1142 Type 2 diabetes mellitus with diabetic polyneuropathy: Secondary | ICD-10-CM | POA: Diagnosis not present

## 2021-03-29 DIAGNOSIS — Z87891 Personal history of nicotine dependence: Secondary | ICD-10-CM | POA: Diagnosis not present

## 2021-03-29 DIAGNOSIS — R062 Wheezing: Secondary | ICD-10-CM | POA: Diagnosis not present

## 2021-03-29 DIAGNOSIS — I10 Essential (primary) hypertension: Secondary | ICD-10-CM | POA: Diagnosis not present

## 2021-03-29 DIAGNOSIS — J449 Chronic obstructive pulmonary disease, unspecified: Secondary | ICD-10-CM | POA: Diagnosis not present

## 2021-03-29 DIAGNOSIS — R059 Cough, unspecified: Secondary | ICD-10-CM | POA: Diagnosis not present

## 2021-03-29 DIAGNOSIS — U071 COVID-19: Secondary | ICD-10-CM | POA: Diagnosis not present

## 2021-03-29 DIAGNOSIS — Z299 Encounter for prophylactic measures, unspecified: Secondary | ICD-10-CM | POA: Diagnosis not present

## 2021-03-29 DIAGNOSIS — E1165 Type 2 diabetes mellitus with hyperglycemia: Secondary | ICD-10-CM | POA: Diagnosis not present

## 2021-03-29 DIAGNOSIS — I480 Paroxysmal atrial fibrillation: Secondary | ICD-10-CM | POA: Diagnosis not present

## 2021-03-29 DIAGNOSIS — R058 Other specified cough: Secondary | ICD-10-CM | POA: Diagnosis not present

## 2021-04-13 ENCOUNTER — Other Ambulatory Visit: Payer: Self-pay | Admitting: Internal Medicine

## 2021-05-05 DIAGNOSIS — I10 Essential (primary) hypertension: Secondary | ICD-10-CM | POA: Diagnosis not present

## 2021-05-05 DIAGNOSIS — J449 Chronic obstructive pulmonary disease, unspecified: Secondary | ICD-10-CM | POA: Diagnosis not present

## 2021-05-05 DIAGNOSIS — M545 Low back pain, unspecified: Secondary | ICD-10-CM | POA: Diagnosis not present

## 2021-05-05 DIAGNOSIS — Z299 Encounter for prophylactic measures, unspecified: Secondary | ICD-10-CM | POA: Diagnosis not present

## 2021-05-05 DIAGNOSIS — Z87891 Personal history of nicotine dependence: Secondary | ICD-10-CM | POA: Diagnosis not present

## 2021-05-05 DIAGNOSIS — E1142 Type 2 diabetes mellitus with diabetic polyneuropathy: Secondary | ICD-10-CM | POA: Diagnosis not present

## 2021-05-05 DIAGNOSIS — E1165 Type 2 diabetes mellitus with hyperglycemia: Secondary | ICD-10-CM | POA: Diagnosis not present

## 2021-05-16 ENCOUNTER — Other Ambulatory Visit: Payer: Self-pay | Admitting: Internal Medicine

## 2021-05-23 DIAGNOSIS — H905 Unspecified sensorineural hearing loss: Secondary | ICD-10-CM | POA: Diagnosis not present

## 2021-06-06 DIAGNOSIS — Z713 Dietary counseling and surveillance: Secondary | ICD-10-CM | POA: Diagnosis not present

## 2021-06-06 DIAGNOSIS — Z299 Encounter for prophylactic measures, unspecified: Secondary | ICD-10-CM | POA: Diagnosis not present

## 2021-06-06 DIAGNOSIS — I10 Essential (primary) hypertension: Secondary | ICD-10-CM | POA: Diagnosis not present

## 2021-06-12 ENCOUNTER — Other Ambulatory Visit: Payer: Self-pay | Admitting: Internal Medicine

## 2021-06-22 ENCOUNTER — Ambulatory Visit (INDEPENDENT_AMBULATORY_CARE_PROVIDER_SITE_OTHER): Payer: Medicare Other | Admitting: Urology

## 2021-06-22 ENCOUNTER — Encounter: Payer: Self-pay | Admitting: Urology

## 2021-06-22 VITALS — BP 109/67 | HR 71

## 2021-06-22 DIAGNOSIS — N50819 Testicular pain, unspecified: Secondary | ICD-10-CM

## 2021-06-22 DIAGNOSIS — R3912 Poor urinary stream: Secondary | ICD-10-CM | POA: Diagnosis not present

## 2021-06-22 DIAGNOSIS — N50811 Right testicular pain: Secondary | ICD-10-CM | POA: Diagnosis not present

## 2021-06-22 DIAGNOSIS — Z125 Encounter for screening for malignant neoplasm of prostate: Secondary | ICD-10-CM

## 2021-06-22 MED ORDER — SULFAMETHOXAZOLE-TRIMETHOPRIM 800-160 MG PO TABS: 1.0000 | ORAL_TABLET | Freq: Two times a day (BID) | ORAL | 0 refills | Status: DC

## 2021-06-22 NOTE — Progress Notes (Signed)
06/22/2021 1:38 PM   Justin [name] 1950/10/30 [id number]  Referring provider: Kirstie Peri, MD [address] [place],  [place] [address]  Right testicular pain  HPI: Justin [name] is a 70yo here for followup for right testis pain. He was doing well until 2 weeks ago when he developed a weaker urinary stream, dysuria, straining to urinate, and worsening right testicular pain. IPSS 25 QOL 6. He is currently on flomax 0.4mg  daily. UA normal. No other concerns today   PMH: Past Medical History:  Diagnosis Date   AF (atrial fibrillation) (HCC)    Anxiety and depression    Hypertension    Hypothyroidism    Obesity    Palpitations    SVT (supraventricular tachycardia) (HCC)     Surgical History: Past Surgical History:  Procedure Laterality Date   CARDIOVASCULAR STRESS TEST  09/05/2007   CHOLECYSTECTOMY     CYSTOSCOPY WITH INSERTION OF UROLIFT N/A 08/13/2017   Procedure: CYSTOSCOPY WITH INSERTION OF UROLIFT;  Surgeon: Malen Gauze, MD;  Location: [facility];  Service: Urology;  Laterality: N/A;   PAROTIDECTOMY  08/07/2018   PAROTIDECTOMY Right 08/07/2018   Procedure: PAROTIDECTOMY;  Surgeon: Osborn Coho, MD;  Location: [facility] OR;  Service: ENT;  Laterality: Right;   TONSILLECTOMY      Home Medications:  Allergies as of 06/22/2021       Reactions   Halcion [triazolam] Other (See Comments)   Broke the hospital bed        Medication List        Accurate as of Jun 22, 2021  1:38 PM. If you have any questions, ask your nurse or doctor.          albuterol 108 (90 Base) MCG/ACT inhaler Commonly known as: VENTOLIN HFA Inhale 2 puffs into the lungs 4 (four) times daily.   ALPRAZolam 1 MG tablet Commonly known as: XANAX Take 1 mg by mouth 3 (three) times daily.   cetirizine 10 MG tablet Commonly known as: ZYRTEC Take 10 mg by mouth daily.   furosemide 40 MG tablet Commonly known as: LASIX Take 40 mg by mouth 2 (two) times daily as needed (swelling feet/fluid  retention.).   levothyroxine 125 MCG tablet Commonly known as: SYNTHROID Take 125 mcg by mouth daily before breakfast.   levothyroxine 137 MCG tablet Commonly known as: SYNTHROID Take 137 mcg by mouth daily.   metFORMIN 500 MG tablet Commonly known as: GLUCOPHAGE Take 250 mg by mouth daily.   metoprolol succinate 25 MG 24 hr tablet Commonly known as: TOPROL-XL TAKE 1 TABLET BY MOUTH DAILY   nitroGLYCERIN 0.4 MG SL tablet Commonly known as: NITROSTAT Place 1 tablet (0.4 mg total) under the tongue every 5 (five) minutes as needed for chest pain.   PARoxetine 20 MG tablet Commonly known as: PAXIL Take 10 mg by mouth daily.   potassium chloride 10 MEQ tablet Commonly known as: KLOR-CON M TAKE 1 TABLET BY MOUTH EVERY DAY (NEEDS APPOINTMENT SCHEDULED)   Symbicort 160-4.5 MCG/ACT inhaler Generic drug: budesonide-formoterol Inhale 2 puffs into the lungs 2 (two) times daily.   tamsulosin 0.4 MG Caps capsule Commonly known as: FLOMAX Take 0.4 mg by mouth daily.        Allergies:  Allergies  Allergen Reactions   Halcion [Triazolam] Other (See Comments)    Broke the hospital bed    Family History: Family History  Problem Relation Age of Onset   Heart disease Mother    Heart disease Father  Social History:  reports that he has quit smoking. He has quit using smokeless tobacco.  His smokeless tobacco use included snuff. He reports that he does not drink alcohol and does not use drugs.  ROS: All other review of systems were reviewed and are negative except what is noted above in HPI  Physical Exam: BP 109/67   Pulse 71   Constitutional:  Alert and oriented, No acute distress. HEENT: [place] AT, moist mucus membranes.  Trachea midline, no masses. Cardiovascular: No clubbing, cyanosis, or edema. Respiratory: Normal respiratory effort, no increased work of breathing. GI: Abdomen is soft, nontender, nondistended, no abdominal masses GU: No CVA tenderness. Circumcised  phallus. No masses/lesions on penis, testis, scrotum. Prostate 40g smooth no nodules no induration.  Lymph: No cervical or inguinal lymphadenopathy. Skin: No rashes, bruises or suspicious lesions. Neurologic: Grossly intact, no focal deficits, moving all 4 extremities. Psychiatric: Normal mood and affect.  Laboratory Data: Lab Results  Component Value Date   WBC 7.1 11/10/2019   HGB 15.9 11/10/2019   HCT 46.3 11/10/2019   MCV 98.3 11/10/2019   PLT 125 (L) 11/10/2019    Lab Results  Component Value Date   CREATININE 1.18 11/10/2019    No results found for: PSA  No results found for: TESTOSTERONE  No results found for: HGBA1C  Urinalysis    Component Value Date/Time   COLORURINE YELLOW 11/20/2016 2039   APPEARANCEUR Clear 11/10/2020 0918   LABSPEC 1.015 11/20/2016 2039   PHURINE 5.0 11/20/2016 2039   GLUCOSEU Negative 11/10/2020 0918   HGBUR NEGATIVE 11/20/2016 2039   BILIRUBINUR Negative 11/10/2020 0918   KETONESUR NEGATIVE 11/20/2016 2039   PROTEINUR Negative 11/10/2020 0918   PROTEINUR NEGATIVE 11/20/2016 2039   NITRITE Negative 11/10/2020 0918   NITRITE NEGATIVE 11/20/2016 2039   LEUKOCYTESUR Negative 11/10/2020 0918    Lab Results  Component Value Date   LABMICR See below: 11/10/2020   WBCUA None seen 11/10/2020   LABEPIT 0-10 11/10/2020   MUCUS Present 11/10/2020   BACTERIA None seen 11/10/2020    Pertinent Imaging:  No results found for this or any previous visit.  No results found for this or any previous visit.  No results found for this or any previous visit.  No results found for this or any previous visit.  No results found for this or any previous visit.  No results found for this or any previous visit.  No results found for this or any previous visit.  No results found for this or any previous visit.   Assessment & Plan:    1. Pain in testicle, unspecified laterality -bactrim DS BID for 28 days  - Urinalysis, Routine w reflex  microscopic  2. Weak urinary stream -continue flomax 0.4mg  daily   No follow-ups on file.  [name], MD  [facility] [place]

## 2021-06-22 NOTE — Patient Instructions (Signed)
Epididymitis  Epididymitis is inflammation or swelling of the epididymis. This is caused by an infection. The epididymis is a cord-like structure that is located along the top and back part of the testicle. It collects and stores sperm from the testicle. This condition can also cause pain and swelling of the testicle and scrotum. Symptoms usually start suddenly (acute epididymitis). Sometimes epididymitis starts gradually and lasts for a while (chronic epididymitis). Chronic epididymitis may be harder to treat. What are the causes? In men ages 20-40, this condition is usually caused by a bacterial infection or a sexually transmitted infection (STI), such as gonorrhea or chlamydia. In men 40 and older, this condition is usually caused by bacteria from a urinary blockage or from abnormalities in the urinary system. These can result from: Having a tube placed into the bladder (urinary catheter). Having an enlarged or inflamed prostate gland. Having recently had urinary tract surgery. Having a problem with a backward flow of urine (retrograde). In men who have a condition that weakens the body's defense system (immune system), such as human immunodeficiency virus (HIV), this condition can be caused by: Other bacteria, including tuberculosis and syphilis. Viruses. Fungi. Sometimes this condition occurs without infection. This may happen because of trauma or repetitive activities such as sports. What increases the risk? You are more likely to develop this condition if you have: Unprotected sex with more than one partner. Anal sex. Had recent surgery. A urinary catheter. Urinary problems. A suppressed immune system. What are the signs or symptoms? This condition usually begins suddenly with chills, fever, and pain behind the scrotum and in the testicle. Other symptoms include: Swelling of the scrotum, testicle, or both. Pain when ejaculating or urinating. Pain in the back or  abdomen. Nausea. Itching and discharge from the penis. A frequent need to pass urine. Redness, increased warmth, and tenderness of the scrotum. How is this diagnosed? Your health care provider can diagnose this condition based on your symptoms and medical history. Your health care provider will also do a physical exam to check your scrotum and testicle for swelling, pain, and redness. You may also have other tests, including: Testing of discharge from the penis. Testing your urine for infections, such as STIs. Ultrasound to check for blood flow and inflammation. Your health care provider may test you for other STIs, including HIV. How is this treated? Treatment for this condition depends on the cause. If your condition is caused by a bacterial infection, oral antibiotic medicine may be prescribed. If the bacterial infection has spread to your blood, you may need to receive IV antibiotics. For both bacterial and nonbacterial epididymitis, you may be treated with: Rest. Elevation of the scrotum. Pain medicines. Anti-inflammatory medicines. Surgery may be needed if: You have pus buildup in the scrotum (abscess). You have epididymitis that has not responded to other treatments. Follow these instructions at home: Medicines Take over-the-counter and prescription medicines only as told by your health care provider. If you were prescribed an antibiotic medicine, take it as told by your health care provider. Do not stop taking the antibiotic even if your condition improves. Sexual activity If your epididymitis was caused by an STI, avoid sexual activity until your treatment is complete. Inform your sexual partner or partners if you test positive for an STI. They may need to be treated. Do not engage in sexual activity with your partner or partners until their treatment is completed. Managing pain and swelling  If directed, raise (elevate) your scrotum and apply ice.   To do this: Put ice in a  plastic bag. Place a small towel or pillow between your legs. Rest your scrotum on the pillow or towel. Place another towel between your skin and the plastic bag. Leave the ice on for 20 minutes, 2-3 times a day. Remove the ice if your skin turns bright red. This is very important. If you cannot feel pain, heat, or cold, you have a greater risk of damage to the area. Keep your scrotum elevated and supported while resting. Ask your health care provider if you should wear a scrotal support, such as a jockstrap. Wear it as told by your health care provider. Try taking a sitz bath to help with discomfort. This is a warm water bath that is taken while you are sitting down. The water should come up to your hips and should cover your buttocks. Do this 3-4 times per day or as told by your health care provider. General instructions Drink enough fluid to keep your urine pale yellow. Return to your normal activities as told by your health care provider. Ask your health care provider what activities are safe for you. Keep all follow-up visits. This is important. Contact a health care provider if: You have a fever. Your pain medicine is not helping. Your pain is getting worse. Your symptoms do not improve within 3 days. Summary Epididymitis is inflammation or swelling of the epididymis. This is caused by an infection. This condition can also cause pain and swelling of the testicle and scrotum. Treatment for this condition depends on the cause. If your condition is caused by a bacterial infection, oral antibiotic medicine may be prescribed. Inform your sexual partner or partners if you test positive for an STI. They may need to be treated. Do not engage in sexual activity with your partner or partners until their treatment is completed. Contact a health care provider if your symptoms do not improve within 3 days. This information is not intended to replace advice given to you by your health care provider.  Make sure you discuss any questions you have with your health care provider. Document Revised: 08/25/2020 Document Reviewed: 08/25/2020 Elsevier Patient Education  2023 [employer]  

## 2021-06-23 LAB — URINALYSIS, ROUTINE W REFLEX MICROSCOPIC
Bilirubin, UA: NEGATIVE
Glucose, UA: NEGATIVE
Ketones, UA: NEGATIVE
Leukocytes,UA: NEGATIVE
Nitrite, UA: NEGATIVE
Protein,UA: NEGATIVE
RBC, UA: NEGATIVE
Specific Gravity, UA: 1.02 (ref 1.005–1.030)
Urobilinogen, Ur: 0.2 mg/dL (ref 0.2–1.0)
pH, UA: 6 (ref 5.0–7.5)

## 2021-06-23 LAB — PSA: Prostate Specific Ag, Serum: 0.4 ng/mL (ref 0.0–4.0)

## 2021-06-29 NOTE — Progress Notes (Unsigned)
Cardiology Office Note Date:  06/30/2021  Patient ID:  Justin [name], DOB 09-04-1950, MRN [id number] PCP:  Kirstie PeriShah, Ashish, MD  Electrophysiologist: Dr. Ladona Ridgelaylor    Chief Complaint: annual visit  History of Present Illness: Justin [name] is a 71 y.o. male with history of HTN, hypothyroidism, (pre?) DM, PVCs/NSVT and some NS AT  AFib is listed on problem list but not discussed specifically by Dr. Ladona Ridgelaylor.  He comes in today to be seen for Dr. Ladona Ridgelaylor, last seen by him March 2022, he had recently been started on BB, also had lost some weight, still having some palpitations, but better.  No changes were made.  TODAY He is doing well. 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  Likes to golf, drives the cart but walks quite a bit on the course as well.  Takes care of his house, yard, no exertional intolerances. He does have some palpitations, generally infrequent and brief, rarely last a minute or two.  The longer episodes are little anxiety provoking but ot particularly symptomatic otherwise. The behavior largely the same as it always has been. No clear trigger NO SOB, DOE He does have some CP, random stinging that is brief to the left chest and a more epigastric heaviness.  Also brief, random, no positional or exertional, no associated symptoms. Has never really been concerned, doesn't stop him from doing anything. Says he has had multiple stress tests. Not a new symptoms, and nothing he would want to pursue any testing for.   Past Medical History:  Diagnosis Date   AF (atrial fibrillation) (HCC)    Anxiety and depression    Hypertension    Hypothyroidism    Obesity    Palpitations    SVT (supraventricular tachycardia) (HCC)     Past Surgical History:  Procedure Laterality Date   CARDIOVASCULAR STRESS TEST  09/05/2007   CHOLECYSTECTOMY     CYSTOSCOPY WITH INSERTION OF UROLIFT N/A 08/13/2017   Procedure: CYSTOSCOPY WITH INSERTION OF UROLIFT;   Surgeon: Malen GauzeMcKenzie, Patrick L, MD;  Location: [facility];  Service: Urology;  Laterality: N/A;   PAROTIDECTOMY  08/07/2018   PAROTIDECTOMY Right 08/07/2018   Procedure: PAROTIDECTOMY;  Surgeon: Osborn CohoShoemaker, David, MD;  [facility];  Service: ENT;  Laterality: Right;   TONSILLECTOMY      Current Outpatient Medications  Medication Sig Dispense Refill   albuterol (VENTOLIN HFA) 108 (90 Base) MCG/ACT inhaler Inhale 2 puffs into the lungs 4 (four) times daily.     ALPRAZolam (XANAX) 1 MG tablet Take 1 mg by mouth 3 (three) times daily.      cetirizine (ZYRTEC) 10 MG tablet Take 10 mg by mouth daily.     furosemide (LASIX) 40 MG tablet Take 40 mg by mouth 2 (two) times daily as needed (swelling feet/fluid retention.).      levothyroxine (SYNTHROID) 137 MCG tablet Take 137 mcg by mouth daily.     levothyroxine (SYNTHROID, LEVOTHROID) 125 MCG tablet Take 125 mcg by mouth daily before breakfast.   7   metFORMIN (GLUCOPHAGE) 500 MG tablet Take 250 mg by mouth daily.     metoprolol succinate (TOPROL-XL) 25 MG 24 hr tablet TAKE 1 TABLET BY MOUTH DAILY 90 tablet 1   nitroGLYCERIN (NITROSTAT) 0.4 MG SL tablet Place 1 tablet (0.4 mg total) under the tongue every 5 (five) minutes as needed for chest pain. 25 tablet 3   PARoxetine (PAXIL) 20 MG tablet Take 10 mg by mouth daily.  potassium chloride (KLOR-CON M) 10 MEQ tablet TAKE 1 TABLET BY MOUTH EVERY DAY (NEEDS APPOINTMENT SCHEDULED) 15 tablet 0   sulfamethoxazole-trimethoprim (BACTRIM DS) 800-160 MG tablet Take 1 tablet by mouth every 12 (twelve) hours. 56 tablet 0   SYMBICORT 160-4.5 MCG/ACT inhaler Inhale 2 puffs into the lungs 2 (two) times daily.     tamsulosin (FLOMAX) 0.4 MG CAPS capsule Take 0.4 mg by mouth daily.     No current facility-administered medications for this visit.    Allergies:   Halcion [triazolam]   Social History:  The patient  reports that he has quit smoking. He has quit using smokeless tobacco.  His smokeless tobacco use  included snuff. He reports that he does not drink alcohol and does not use drugs.   Family History:  The patient's family history includes Heart disease in his father and mother.  ROS:  Please see the history of present illness.    All other systems are reviewed and otherwise negative.   PHYSICAL EXAM:  VS:  BP 104/62   Pulse (!) 53   Ht 5' 10.5" (1.791 m)   Wt 253 lb 12.8 oz (115.1 kg)   SpO2 96%   BMI 35.90 kg/m  BMI: Body mass index is 35.9 kg/m. Well nourished, well developed, in no acute distress HEENT: normocephalic, atraumatic Neck: no JVD, carotid bruits or masses Cardiac:  RRR; no significant murmurs, no rubs, or gallops Lungs:  CTA b/l, no wheezing, rhonchi or rales Abd: soft, nontender, obese MS: no deformity or atrophy Ext: no edema Skin: warm and dry, no rash Neuro:  No gross deficits appreciated Psych: euthymic mood, full affect    EKG:  Done today and reviewed by myself shows  SB 53bpm, no ST/T changes  07/2018: Monitor 1. NSR with sinus bradycardia and sinus tachycardia 2. NS atrial tachy 3. NS Ventricular tachycardia 4. Nocturnal bradycardia  07/24/2018: TTE  1. The left ventricle has normal systolic function with an ejection  fraction of 60-65%. The cavity size was normal. There is mildly increased  left ventricular wall thickness. Left ventricular diastolic Doppler  parameters are consistent with impaired  relaxation.   2. The right ventricle has normal systolic function. The cavity was  normal.   3. The aortic valve is tricuspid. Mild thickening of the aortic valve. No  stenosis of the aortic valve.   4. Technically difficult; definity used; normal LV systolic function;  mild LVH; mild diastolic dysfunction.   5. The mitral valve is grossly normal.    Stress Echo 02/28/2013 Stress results:   Maximal heart rate during stress was 134bpm (85% of maximal predicted heart rate). The maximal predicted heart rate was 157bpm.The target heart rate  was achieved. The heart rate response to stress was normal. There was a normal resting blood pressure. Normal blood pressure response to dobutamine. The rate-pressure product for the peak heart rate and blood pressure was Hg/min.  The patient experienced no chest pain during Stress.   NM Stress Test 08/25/2015  There was no ST segment deviation noted during stress. Findings consistent with mild inferoapcal ischemia, cannot rule out slight differences in apical thinning as cause of defect. Either finding is low risk. The left ventricular ejection fraction is hyperdynamic (>65%). This is a low risk study.  Recent Labs: No results found for requested labs within last 8760 hours.  No results found for requested labs within last 8760 hours.   CrCl cannot be calculated (Patient's most recent lab result is older  than the maximum 21 days allowed.).   Wt Readings from Last 3 Encounters:  06/30/21 253 lb 12.8 oz (115.1 kg)  12/16/20 256 lb (116.1 kg)  11/10/20 260 lb (117.9 kg)     Other studies reviewed: Additional studies/records reviewed today include: summarized above  ASSESSMENT AND PLAN:  NSVT, PVCs ATach No excalation in behavior  3.  HTN Lowish today, no symptoms, follow  4. CP Sounds atypical Not new, not changing  Discussed heart healthy life style, he is actively and slowly/steadily losing weight, PMD monitors his lipids  Disposition: F/u with [place] annually sooner if needed  Current medicines are reviewed at length with the patient today.  The patient did not have any concerns regarding medicines.  Norma Fredrickson, PA-C 06/30/2021 10:57 AM     [facility] [address] [place] [address] [phone] (office)  [phone] (fax)

## 2021-06-30 ENCOUNTER — Telehealth: Payer: Self-pay | Admitting: Physician Assistant

## 2021-06-30 ENCOUNTER — Ambulatory Visit (INDEPENDENT_AMBULATORY_CARE_PROVIDER_SITE_OTHER): Payer: Medicare Other | Admitting: Physician Assistant

## 2021-06-30 ENCOUNTER — Telehealth: Payer: Self-pay

## 2021-06-30 ENCOUNTER — Encounter: Payer: Self-pay | Admitting: Physician Assistant

## 2021-06-30 VITALS — BP 104/62 | HR 53 | Ht 70.5 in | Wt 253.8 lb

## 2021-06-30 DIAGNOSIS — R0789 Other chest pain: Secondary | ICD-10-CM | POA: Diagnosis not present

## 2021-06-30 DIAGNOSIS — I10 Essential (primary) hypertension: Secondary | ICD-10-CM

## 2021-06-30 DIAGNOSIS — I471 Supraventricular tachycardia: Secondary | ICD-10-CM

## 2021-06-30 DIAGNOSIS — R002 Palpitations: Secondary | ICD-10-CM

## 2021-06-30 MED ORDER — METOPROLOL SUCCINATE ER 25 MG PO TB24: 25.0000 mg | ORAL_TABLET | Freq: Every day | ORAL | 3 refills | Status: AC

## 2021-06-30 NOTE — Addendum Note (Signed)
Addended by: Oleta Mouse on: 06/30/2021 01:45 PM   Modules accepted: Orders

## 2021-06-30 NOTE — Patient Instructions (Signed)
Medication Instructions:    Your physician recommends that you continue on your current medications as directed. Please refer to the Current Medication list given to you today.  *If you need a refill on your cardiac medications before your next appointment, please call your pharmacy*   Lab Work: NONE ORDERED  TODAY    If you have labs (blood work) drawn today and your tests are completely normal, you will receive your results only by: MyChart Message (if you have MyChart) OR A paper copy in the mail If you have any lab test that is abnormal or we need to change your treatment, we will call you to review the results.   Testing/Procedures: NONE ORDERED  TODAY   Follow-Up: At [facility], you and your health needs are our priority.  As part of our continuing mission to provide you with exceptional heart care, we have created designated Provider Care Teams.  These Care Teams include your primary Cardiologist (physician) and Advanced Practice Providers (APPs -  Physician Assistants and Nurse Practitioners) who all work together to provide you with the care you need, when you need it.  We recommend signing up for the patient portal called "MyChart".  Sign up information is provided on this After Visit Summary.  MyChart is used to connect with patients for Virtual Visits (Telemedicine).  Patients are able to view lab/test results, encounter notes, upcoming appointments, etc.  Non-urgent messages can be sent to your provider as well.   To learn more about what you can do with MyChart, go to [email or web address].    Your next appointment:   1 year(s)  The format for your next appointment:   In Person  Provider:   You may see Dr.Taylor  or one of the following Advanced Practice Providers on your designated Care Team:   Renee Ursuy, PA-C Michael "Andy" Tillery, PA-C{      Other Instructions   Important Information About Sugar       

## 2021-06-30 NOTE — Telephone Encounter (Signed)
Thank you :)

## 2021-06-30 NOTE — Telephone Encounter (Signed)
Patient left a voice message 06-30-2021.  Wanting to know PSA level result. Has been waiting for a call back for 2 weeks.  Call back:  [phone] (H)    Thanks, Rosey Bath

## 2021-06-30 NOTE — Telephone Encounter (Signed)
*  STAT* If patient is at the pharmacy, call can be transferred to refill team.   1. Which medications need to be refilled? (please list name of each medication and dose if known) metoprolol succinate (TOPROL-XL) 25 MG 24 hr tablet  2. Which pharmacy/location (including street and city if local pharmacy) is medication to be sent to? [facility] - [place], [place] - [address]  3. Do they need a 30 day or 90 day supply? 30  Pt also states that he was told by Francis Dowse, PA to call to let them know when metoprolol he is on and he states it is the XL 25 mg .

## 2021-06-30 NOTE — Telephone Encounter (Signed)
Pt stated that he needed to let [facility] know what pt's medication metoprolol was. Pt stated he takes metoprolol XL 25 mg tablet. Can we refill pt medication? Please address

## 2021-07-02 NOTE — Telephone Encounter (Signed)
Patient called and given PSA result

## 2021-07-19 ENCOUNTER — Other Ambulatory Visit: Payer: Self-pay | Admitting: Physician Assistant

## 2021-08-05 ENCOUNTER — Ambulatory Visit: Payer: Medicare Other | Admitting: Urology

## 2021-08-26 ENCOUNTER — Ambulatory Visit (INDEPENDENT_AMBULATORY_CARE_PROVIDER_SITE_OTHER): Payer: Medicare Other | Admitting: Urology

## 2021-08-26 ENCOUNTER — Encounter: Payer: Self-pay | Admitting: Urology

## 2021-08-26 VITALS — BP 135/78 | HR 64 | Ht 70.5 in | Wt 255.0 lb

## 2021-08-26 DIAGNOSIS — R3912 Poor urinary stream: Secondary | ICD-10-CM | POA: Diagnosis not present

## 2021-08-26 DIAGNOSIS — N50819 Testicular pain, unspecified: Secondary | ICD-10-CM

## 2021-08-26 DIAGNOSIS — N50811 Right testicular pain: Secondary | ICD-10-CM

## 2021-08-26 LAB — MICROSCOPIC EXAMINATION
Bacteria, UA: NONE SEEN
Epithelial Cells (non renal): NONE SEEN /hpf (ref 0–10)
RBC, Urine: NONE SEEN /hpf (ref 0–2)
Renal Epithel, UA: NONE SEEN /hpf
WBC, UA: NONE SEEN /hpf (ref 0–5)

## 2021-08-26 LAB — URINALYSIS, ROUTINE W REFLEX MICROSCOPIC
Bilirubin, UA: NEGATIVE
Glucose, UA: NEGATIVE
Ketones, UA: NEGATIVE
Leukocytes,UA: NEGATIVE
Nitrite, UA: NEGATIVE
Protein,UA: NEGATIVE
Specific Gravity, UA: 1.015 (ref 1.005–1.030)
Urobilinogen, Ur: 0.2 mg/dL (ref 0.2–1.0)
pH, UA: 5 (ref 5.0–7.5)

## 2021-08-26 MED ORDER — SILODOSIN 8 MG PO CAPS: 8.0000 mg | ORAL_CAPSULE | Freq: Every day | ORAL | 11 refills | Status: DC

## 2021-08-26 MED ORDER — DOXYCYCLINE HYCLATE 100 MG PO CAPS: 100.0000 mg | ORAL_CAPSULE | Freq: Two times a day (BID) | ORAL | 0 refills | Status: DC

## 2021-08-26 NOTE — Patient Instructions (Signed)
Epididymitis  Epididymitis is inflammation or swelling of the epididymis. This is caused by an infection. The epididymis is a cord-like structure that is located along the top and back part of the testicle. It collects and stores sperm from the testicle. This condition can also cause pain and swelling of the testicle and scrotum. Symptoms usually start suddenly (acute epididymitis). Sometimes epididymitis starts gradually and lasts for a while (chronic epididymitis). Chronic epididymitis may be harder to treat. What are the causes? In men ages 20-40, this condition is usually caused by a bacterial infection or a sexually transmitted infection (STI), such as gonorrhea or chlamydia. In men 40 and older, this condition is usually caused by bacteria from a urinary blockage or from abnormalities in the urinary system. These can result from: Having a tube placed into the bladder (urinary catheter). Having an enlarged or inflamed prostate gland. Having recently had urinary tract surgery. Having a problem with a backward flow of urine (retrograde). In men who have a condition that weakens the body's defense system (immune system), such as human immunodeficiency virus (HIV), this condition can be caused by: Other bacteria, including tuberculosis and syphilis. Viruses. Fungi. Sometimes this condition occurs without infection. This may happen because of trauma or repetitive activities such as sports. What increases the risk? You are more likely to develop this condition if you have: Unprotected sex with more than one partner. Anal sex. Had recent surgery. A urinary catheter. Urinary problems. A suppressed immune system. What are the signs or symptoms? This condition usually begins suddenly with chills, fever, and pain behind the scrotum and in the testicle. Other symptoms include: Swelling of the scrotum, testicle, or both. Pain when ejaculating or urinating. Pain in the back or  abdomen. Nausea. Itching and discharge from the penis. A frequent need to pass urine. Redness, increased warmth, and tenderness of the scrotum. How is this diagnosed? Your health care provider can diagnose this condition based on your symptoms and medical history. Your health care provider will also do a physical exam to check your scrotum and testicle for swelling, pain, and redness. You may also have other tests, including: Testing of discharge from the penis. Testing your urine for infections, such as STIs. Ultrasound to check for blood flow and inflammation. Your health care provider may test you for other STIs, including HIV. How is this treated? Treatment for this condition depends on the cause. If your condition is caused by a bacterial infection, oral antibiotic medicine may be prescribed. If the bacterial infection has spread to your blood, you may need to receive IV antibiotics. For both bacterial and nonbacterial epididymitis, you may be treated with: Rest. Elevation of the scrotum. Pain medicines. Anti-inflammatory medicines. Surgery may be needed if: You have pus buildup in the scrotum (abscess). You have epididymitis that has not responded to other treatments. Follow these instructions at home: Medicines Take over-the-counter and prescription medicines only as told by your health care provider. If you were prescribed an antibiotic medicine, take it as told by your health care provider. Do not stop taking the antibiotic even if your condition improves. Sexual activity If your epididymitis was caused by an STI, avoid sexual activity until your treatment is complete. Inform your sexual partner or partners if you test positive for an STI. They may need to be treated. Do not engage in sexual activity with your partner or partners until their treatment is completed. Managing pain and swelling  If directed, raise (elevate) your scrotum and apply ice.   To do this: Put ice in a  plastic bag. Place a small towel or pillow between your legs. Rest your scrotum on the pillow or towel. Place another towel between your skin and the plastic bag. Leave the ice on for 20 minutes, 2-3 times a day. Remove the ice if your skin turns bright red. This is very important. If you cannot feel pain, heat, or cold, you have a greater risk of damage to the area. Keep your scrotum elevated and supported while resting. Ask your health care provider if you should wear a scrotal support, such as a jockstrap. Wear it as told by your health care provider. Try taking a sitz bath to help with discomfort. This is a warm water bath that is taken while you are sitting down. The water should come up to your hips and should cover your buttocks. Do this 3-4 times per day or as told by your health care provider. General instructions Drink enough fluid to keep your urine pale yellow. Return to your normal activities as told by your health care provider. Ask your health care provider what activities are safe for you. Keep all follow-up visits. This is important. Contact a health care provider if: You have a fever. Your pain medicine is not helping. Your pain is getting worse. Your symptoms do not improve within 3 days. Summary Epididymitis is inflammation or swelling of the epididymis. This is caused by an infection. This condition can also cause pain and swelling of the testicle and scrotum. Treatment for this condition depends on the cause. If your condition is caused by a bacterial infection, oral antibiotic medicine may be prescribed. Inform your sexual partner or partners if you test positive for an STI. They may need to be treated. Do not engage in sexual activity with your partner or partners until their treatment is completed. Contact a health care provider if your symptoms do not improve within 3 days. This information is not intended to replace advice given to you by your health care provider.  Make sure you discuss any questions you have with your health care provider. Document Revised: 08/25/2020 Document Reviewed: 08/25/2020 Elsevier Patient Education  2023 [employer]  

## 2021-08-26 NOTE — Progress Notes (Unsigned)
08/26/2021 11:42 AM   Justin [name] [birth year] [id number]  Referring provider: Kirstie Peri, MD [address] [place],  [place] [address]  Right testicular pain   HPI: Mr Attar is a 71yo here for followup for epididymis and weak urinary stream. IPSS 17 QOL 3. His urine stream is fair, he has occasional straining to urinate. He is bothered by his urinary hesitancy and post void dribbling. He has right testicular pain which has worsened over the past 2 weeks. No dysuria or hematuria. No other complaints today   PMH: Past Medical History:  Diagnosis Date   AF (atrial fibrillation) (HCC)    Anxiety and depression    Hypertension    Hypothyroidism    Obesity    Palpitations    SVT (supraventricular tachycardia) (HCC)     Surgical History: Past Surgical History:  Procedure Laterality Date   CARDIOVASCULAR STRESS TEST  09/05/2007   CHOLECYSTECTOMY     CYSTOSCOPY WITH INSERTION OF UROLIFT N/A 08/13/2017   Procedure: CYSTOSCOPY WITH INSERTION OF UROLIFT;  Surgeon: Malen Gauze, MD;  Location: [facility];  Service: Urology;  Laterality: N/A;   PAROTIDECTOMY  08/07/2018   PAROTIDECTOMY Right 08/07/2018   Procedure: PAROTIDECTOMY;  Surgeon: Osborn Coho, MD;  Location: [facility] OR;  Service: ENT;  Laterality: Right;   TONSILLECTOMY      Home Medications:  Allergies as of 08/26/2021       Reactions   Halcion [triazolam] Other (See Comments)   Broke the hospital bed        Medication List        Accurate as of August 26, 2021 11:42 AM. If you have any questions, ask your nurse or doctor.          albuterol 108 (90 Base) MCG/ACT inhaler Commonly known as: VENTOLIN HFA Inhale 2 puffs into the lungs 4 (four) times daily.   ALPRAZolam 1 MG tablet Commonly known as: XANAX Take 1 mg by mouth 3 (three) times daily.   cetirizine 10 MG tablet Commonly known as: ZYRTEC Take 10 mg by mouth daily.   furosemide 40 MG tablet Commonly known as: LASIX Take 40 mg by mouth 2  (two) times daily as needed (swelling feet/fluid retention.).   levothyroxine 137 MCG tablet Commonly known as: SYNTHROID Take 137 mcg by mouth daily.   metFORMIN 500 MG tablet Commonly known as: GLUCOPHAGE Take 250 mg by mouth daily.   metoprolol succinate 25 MG 24 hr tablet Commonly known as: TOPROL-XL Take 1 tablet (25 mg total) by mouth daily.   nitroGLYCERIN 0.4 MG SL tablet Commonly known as: NITROSTAT Place 1 tablet (0.4 mg total) under the tongue every 5 (five) minutes as needed for chest pain.   PARoxetine 20 MG tablet Commonly known as: PAXIL Take 10 mg by mouth daily.   potassium chloride 10 MEQ tablet Commonly known as: KLOR-CON M Take 1 tablet (10 mEq total) by mouth daily.   sulfamethoxazole-trimethoprim 800-160 MG tablet Commonly known as: BACTRIM DS Take 1 tablet by mouth every 12 (twelve) hours.   Symbicort 160-4.5 MCG/ACT inhaler Generic drug: budesonide-formoterol Inhale 2 puffs into the lungs 2 (two) times daily.   tamsulosin 0.4 MG Caps capsule Commonly known as: FLOMAX Take 0.4 mg by mouth daily.        Allergies:  Allergies  Allergen Reactions   Halcion [Triazolam] Other (See Comments)    Broke the hospital bed    Family History: Family History  Problem Relation Age of Onset  Heart disease Mother    Heart disease Father     Social History:  reports that he has quit smoking. He has quit using smokeless tobacco.  His smokeless tobacco use included snuff. He reports that he does not drink alcohol and does not use drugs.  ROS: All other review of systems were reviewed and are negative except what is noted above in HPI  Physical Exam: BP 135/78   Pulse 64   Ht 5' 10.5" (1.791 m)   Wt 255 lb (115.7 kg)   BMI 36.07 kg/m   Constitutional:  Alert and oriented, No acute distress. HEENT: [place] AT, moist mucus membranes.  Trachea midline, no masses. Cardiovascular: No clubbing, cyanosis, or edema. Respiratory: Normal respiratory effort,  no increased work of breathing. GI: Abdomen is soft, nontender, nondistended, no abdominal masses GU: No CVA tenderness.  Lymph: No cervical or inguinal lymphadenopathy. Skin: No rashes, bruises or suspicious lesions. Neurologic: Grossly intact, no focal deficits, moving all 4 extremities. Psychiatric: Normal mood and affect.  Laboratory Data: Lab Results  Component Value Date   WBC 7.1 11/10/2019   HGB 15.9 11/10/2019   HCT 46.3 11/10/2019   MCV 98.3 11/10/2019   PLT 125 (L) 11/10/2019    Lab Results  Component Value Date   CREATININE 1.18 11/10/2019    No results found for: "PSA"  No results found for: "TESTOSTERONE"  No results found for: "HGBA1C"  Urinalysis    Component Value Date/Time   COLORURINE YELLOW 11/20/2016 2039   APPEARANCEUR Clear 06/23/2021 0842   LABSPEC 1.015 11/20/2016 2039   PHURINE 5.0 11/20/2016 2039   GLUCOSEU Negative 06/23/2021 0842   HGBUR NEGATIVE 11/20/2016 2039   BILIRUBINUR Negative 06/23/2021 0842   KETONESUR NEGATIVE 11/20/2016 2039   PROTEINUR Negative 06/23/2021 0842   PROTEINUR NEGATIVE 11/20/2016 2039   NITRITE Negative 06/23/2021 0842   NITRITE NEGATIVE 11/20/2016 2039   LEUKOCYTESUR Negative 06/23/2021 0842    Lab Results  Component Value Date   LABMICR Comment 06/23/2021   WBCUA None seen 11/10/2020   LABEPIT 0-10 11/10/2020   MUCUS Present 11/10/2020   BACTERIA None seen 11/10/2020    Pertinent Imaging:  No results found for this or any previous visit.  No results found for this or any previous visit.  No results found for this or any previous visit.  No results found for this or any previous visit.  No results found for this or any previous visit.  No results found for this or any previous visit.  No results found for this or any previous visit.  No results found for this or any previous visit.   Assessment & Plan:    1. Pain in testicle, unspecified laterality -doxycycline 100mg  BID for 28  days  2. Weak urinary stream -rapaflo 8mg  daily   No follow-ups on file.  , MD  [facility] [place]

## 2021-09-14 DIAGNOSIS — Z299 Encounter for prophylactic measures, unspecified: Secondary | ICD-10-CM | POA: Diagnosis not present

## 2021-09-14 DIAGNOSIS — Z7189 Other specified counseling: Secondary | ICD-10-CM | POA: Diagnosis not present

## 2021-09-14 DIAGNOSIS — R5383 Other fatigue: Secondary | ICD-10-CM | POA: Diagnosis not present

## 2021-09-14 DIAGNOSIS — E1165 Type 2 diabetes mellitus with hyperglycemia: Secondary | ICD-10-CM | POA: Diagnosis not present

## 2021-09-14 DIAGNOSIS — I10 Essential (primary) hypertension: Secondary | ICD-10-CM | POA: Diagnosis not present

## 2021-09-14 DIAGNOSIS — Z79899 Other long term (current) drug therapy: Secondary | ICD-10-CM | POA: Diagnosis not present

## 2021-09-14 DIAGNOSIS — Z0000 Encounter for general adult medical examination without abnormal findings: Secondary | ICD-10-CM | POA: Diagnosis not present

## 2021-09-26 ENCOUNTER — Ambulatory Visit: Payer: Medicare Other | Admitting: Urology

## 2021-09-26 DIAGNOSIS — N50819 Testicular pain, unspecified: Secondary | ICD-10-CM

## 2021-09-27 ENCOUNTER — Ambulatory Visit (INDEPENDENT_AMBULATORY_CARE_PROVIDER_SITE_OTHER): Payer: Medicare Other | Admitting: Urology

## 2021-09-27 VITALS — BP 109/62 | HR 71

## 2021-09-27 DIAGNOSIS — N50811 Right testicular pain: Secondary | ICD-10-CM

## 2021-09-27 DIAGNOSIS — R3912 Poor urinary stream: Secondary | ICD-10-CM

## 2021-09-27 DIAGNOSIS — N401 Enlarged prostate with lower urinary tract symptoms: Secondary | ICD-10-CM | POA: Diagnosis not present

## 2021-09-27 DIAGNOSIS — N50819 Testicular pain, unspecified: Secondary | ICD-10-CM

## 2021-09-27 DIAGNOSIS — N138 Other obstructive and reflux uropathy: Secondary | ICD-10-CM

## 2021-09-27 LAB — MICROSCOPIC EXAMINATION
Bacteria, UA: NONE SEEN
Epithelial Cells (non renal): NONE SEEN /hpf (ref 0–10)
Renal Epithel, UA: NONE SEEN /hpf
WBC, UA: NONE SEEN /hpf (ref 0–5)

## 2021-09-27 LAB — URINALYSIS, ROUTINE W REFLEX MICROSCOPIC
Bilirubin, UA: NEGATIVE
Glucose, UA: NEGATIVE
Ketones, UA: NEGATIVE
Leukocytes,UA: NEGATIVE
Nitrite, UA: NEGATIVE
Protein,UA: NEGATIVE
Specific Gravity, UA: 1.02 (ref 1.005–1.030)
Urobilinogen, Ur: 0.2 mg/dL (ref 0.2–1.0)
pH, UA: 5.5 (ref 5.0–7.5)

## 2021-09-27 MED ORDER — SILODOSIN 8 MG PO CAPS: 8.0000 mg | ORAL_CAPSULE | Freq: Every day | ORAL | 3 refills | Status: DC

## 2021-09-27 NOTE — Patient Instructions (Signed)

## 2021-09-27 NOTE — Progress Notes (Unsigned)
09/27/2021 9:48 AM   Justin [name] [birth year] [id number]  Referring provider: Kirstie Peri, MD [address] [place],  [place] [address]  No chief complaint on file.   HPI:    PMH: Past Medical History:  Diagnosis Date   AF (atrial fibrillation) (HCC)    Anxiety and depression    Hypertension    Hypothyroidism    Obesity    Palpitations    SVT (supraventricular tachycardia) (HCC)     Surgical History: Past Surgical History:  Procedure Laterality Date   CARDIOVASCULAR STRESS TEST  09/05/2007   CHOLECYSTECTOMY     CYSTOSCOPY WITH INSERTION OF UROLIFT N/A 08/13/2017   Procedure: CYSTOSCOPY WITH INSERTION OF UROLIFT;  Surgeon: Malen Gauze, MD;  Location: [facility];  Service: Urology;  Laterality: N/A;   PAROTIDECTOMY  08/07/2018   PAROTIDECTOMY Right 08/07/2018   Procedure: PAROTIDECTOMY;  Surgeon: Osborn Coho, MD;  Location: [facility] OR;  Service: ENT;  Laterality: Right;   TONSILLECTOMY      Home Medications:  Allergies as of 09/27/2021       Reactions   Halcion [triazolam] Other (See Comments)   Broke the hospital bed        Medication List        Accurate as of September 27, 2021  9:48 AM. If you have any questions, ask your nurse or doctor.          albuterol 108 (90 Base) MCG/ACT inhaler Commonly known as: VENTOLIN HFA Inhale 2 puffs into the lungs 4 (four) times daily.   ALPRAZolam 1 MG tablet Commonly known as: XANAX Take 1 mg by mouth 3 (three) times daily.   cetirizine 10 MG tablet Commonly known as: ZYRTEC Take 10 mg by mouth daily.   doxycycline 100 MG capsule Commonly known as: VIBRAMYCIN Take 1 capsule (100 mg total) by mouth 2 (two) times daily.   furosemide 40 MG tablet Commonly known as: LASIX Take 40 mg by mouth 2 (two) times daily as needed (swelling feet/fluid retention.).   levothyroxine 137 MCG tablet Commonly known as: SYNTHROID Take 137 mcg by mouth daily.   metFORMIN 500 MG tablet Commonly known as: GLUCOPHAGE Take  250 mg by mouth daily.   metoprolol succinate 25 MG 24 hr tablet Commonly known as: TOPROL-XL Take 1 tablet (25 mg total) by mouth daily.   nitroGLYCERIN 0.4 MG SL tablet Commonly known as: NITROSTAT Place 1 tablet (0.4 mg total) under the tongue every 5 (five) minutes as needed for chest pain.   PARoxetine 20 MG tablet Commonly known as: PAXIL Take 10 mg by mouth daily.   potassium chloride 10 MEQ tablet Commonly known as: KLOR-CON M Take 1 tablet (10 mEq total) by mouth daily.   silodosin 8 MG Caps capsule Commonly known as: RAPAFLO Take 1 capsule (8 mg total) by mouth at bedtime.   sulfamethoxazole-trimethoprim 800-160 MG tablet Commonly known as: BACTRIM DS Take 1 tablet by mouth every 12 (twelve) hours.   Symbicort 160-4.5 MCG/ACT inhaler Generic drug: budesonide-formoterol Inhale 2 puffs into the lungs 2 (two) times daily.   tamsulosin 0.4 MG Caps capsule Commonly known as: FLOMAX Take 0.4 mg by mouth daily.        Allergies:  Allergies  Allergen Reactions   Halcion [Triazolam] Other (See Comments)    Broke the hospital bed    Family History: Family History  Problem Relation Age of Onset   Heart disease Mother    Heart disease Father     Social  History:  reports that he has quit smoking. He has quit using smokeless tobacco.  His smokeless tobacco use included snuff. He reports that he does not drink alcohol and does not use drugs.  ROS: All other review of systems were reviewed and are negative except what is noted above in HPI  Physical Exam: BP 109/62   Pulse 71   Constitutional:  Alert and oriented, No acute distress. HEENT: [place] AT, moist mucus membranes.  Trachea midline, no masses. Cardiovascular: No clubbing, cyanosis, or edema. Respiratory: Normal respiratory effort, no increased work of breathing. GI: Abdomen is soft, nontender, nondistended, no abdominal masses GU: No CVA tenderness.  Lymph: No cervical or inguinal  lymphadenopathy. Skin: No rashes, bruises or suspicious lesions. Neurologic: Grossly intact, no focal deficits, moving all 4 extremities. Psychiatric: Normal mood and affect.  Laboratory Data: Lab Results  Component Value Date   WBC 7.1 11/10/2019   HGB 15.9 11/10/2019   HCT 46.3 11/10/2019   MCV 98.3 11/10/2019   PLT 125 (L) 11/10/2019    Lab Results  Component Value Date   CREATININE 1.18 11/10/2019    No results found for: "PSA"  No results found for: "TESTOSTERONE"  No results found for: "HGBA1C"  Urinalysis    Component Value Date/Time   COLORURINE YELLOW 11/20/2016 2039   APPEARANCEUR Clear 08/26/2021 1315   LABSPEC 1.015 11/20/2016 2039   PHURINE 5.0 11/20/2016 2039   GLUCOSEU Negative 08/26/2021 1315   HGBUR NEGATIVE 11/20/2016 2039   BILIRUBINUR Negative 08/26/2021 1315   KETONESUR NEGATIVE 11/20/2016 2039   PROTEINUR Negative 08/26/2021 1315   PROTEINUR NEGATIVE 11/20/2016 2039   NITRITE Negative 08/26/2021 1315   NITRITE NEGATIVE 11/20/2016 2039   LEUKOCYTESUR Negative 08/26/2021 1315    Lab Results  Component Value Date   LABMICR See below: 08/26/2021   WBCUA None seen 08/26/2021   LABEPIT None seen 08/26/2021   MUCUS Present 11/10/2020   BACTERIA None seen 08/26/2021    Pertinent Imaging: *** No results found for this or any previous visit.  No results found for this or any previous visit.  No results found for this or any previous visit.  No results found for this or any previous visit.  No results found for this or any previous visit.  No results found for this or any previous visit.  No results found for this or any previous visit.  No results found for this or any previous visit.   Assessment & Plan:    1. Pain in testicle, unspecified laterality -resolved - Urinalysis, Routine w reflex microscopic  2. BPH with weak stream -Rapaflo 8mg  daily    No follow-ups on file.  , MD  [facility]  [place]

## 2021-09-29 ENCOUNTER — Encounter: Payer: Self-pay | Admitting: Urology

## 2021-12-12 DIAGNOSIS — E1165 Type 2 diabetes mellitus with hyperglycemia: Secondary | ICD-10-CM | POA: Diagnosis not present

## 2021-12-12 DIAGNOSIS — Z299 Encounter for prophylactic measures, unspecified: Secondary | ICD-10-CM | POA: Diagnosis not present

## 2021-12-12 DIAGNOSIS — J449 Chronic obstructive pulmonary disease, unspecified: Secondary | ICD-10-CM | POA: Diagnosis not present

## 2021-12-12 DIAGNOSIS — H6692 Otitis media, unspecified, left ear: Secondary | ICD-10-CM | POA: Diagnosis not present

## 2021-12-12 DIAGNOSIS — M791 Myalgia, unspecified site: Secondary | ICD-10-CM | POA: Diagnosis not present

## 2021-12-29 DIAGNOSIS — I10 Essential (primary) hypertension: Secondary | ICD-10-CM | POA: Diagnosis not present

## 2021-12-29 DIAGNOSIS — E1142 Type 2 diabetes mellitus with diabetic polyneuropathy: Secondary | ICD-10-CM | POA: Diagnosis not present

## 2021-12-29 DIAGNOSIS — Z299 Encounter for prophylactic measures, unspecified: Secondary | ICD-10-CM | POA: Diagnosis not present

## 2021-12-29 DIAGNOSIS — Z0000 Encounter for general adult medical examination without abnormal findings: Secondary | ICD-10-CM | POA: Diagnosis not present

## 2021-12-29 DIAGNOSIS — J302 Other seasonal allergic rhinitis: Secondary | ICD-10-CM | POA: Diagnosis not present

## 2022-03-06 DIAGNOSIS — J069 Acute upper respiratory infection, unspecified: Secondary | ICD-10-CM | POA: Diagnosis not present

## 2022-03-06 DIAGNOSIS — I480 Paroxysmal atrial fibrillation: Secondary | ICD-10-CM | POA: Diagnosis not present

## 2022-03-06 DIAGNOSIS — E1165 Type 2 diabetes mellitus with hyperglycemia: Secondary | ICD-10-CM | POA: Diagnosis not present

## 2022-03-06 DIAGNOSIS — Z299 Encounter for prophylactic measures, unspecified: Secondary | ICD-10-CM | POA: Diagnosis not present

## 2022-03-06 DIAGNOSIS — J441 Chronic obstructive pulmonary disease with (acute) exacerbation: Secondary | ICD-10-CM | POA: Diagnosis not present

## 2022-03-07 DIAGNOSIS — J069 Acute upper respiratory infection, unspecified: Secondary | ICD-10-CM | POA: Diagnosis not present

## 2022-03-07 DIAGNOSIS — R519 Headache, unspecified: Secondary | ICD-10-CM | POA: Diagnosis not present

## 2022-03-07 DIAGNOSIS — Z1152 Encounter for screening for COVID-19: Secondary | ICD-10-CM | POA: Diagnosis not present

## 2022-03-07 DIAGNOSIS — Z87891 Personal history of nicotine dependence: Secondary | ICD-10-CM | POA: Diagnosis not present

## 2022-03-07 DIAGNOSIS — Z20822 Contact with and (suspected) exposure to covid-19: Secondary | ICD-10-CM | POA: Diagnosis not present

## 2022-03-07 DIAGNOSIS — R059 Cough, unspecified: Secondary | ICD-10-CM | POA: Diagnosis not present

## 2022-03-07 DIAGNOSIS — I700 Atherosclerosis of aorta: Secondary | ICD-10-CM | POA: Diagnosis not present

## 2022-03-10 DIAGNOSIS — J441 Chronic obstructive pulmonary disease with (acute) exacerbation: Secondary | ICD-10-CM | POA: Diagnosis not present

## 2022-03-10 DIAGNOSIS — I10 Essential (primary) hypertension: Secondary | ICD-10-CM | POA: Diagnosis not present

## 2022-03-10 DIAGNOSIS — Z299 Encounter for prophylactic measures, unspecified: Secondary | ICD-10-CM | POA: Diagnosis not present

## 2022-03-27 ENCOUNTER — Ambulatory Visit (INDEPENDENT_AMBULATORY_CARE_PROVIDER_SITE_OTHER): Payer: 59 | Admitting: Urology

## 2022-03-27 ENCOUNTER — Encounter: Payer: Self-pay | Admitting: Urology

## 2022-03-27 VITALS — BP 97/61 | HR 80

## 2022-03-27 DIAGNOSIS — R3912 Poor urinary stream: Secondary | ICD-10-CM

## 2022-03-27 DIAGNOSIS — N401 Enlarged prostate with lower urinary tract symptoms: Secondary | ICD-10-CM | POA: Diagnosis not present

## 2022-03-27 DIAGNOSIS — N138 Other obstructive and reflux uropathy: Secondary | ICD-10-CM

## 2022-03-27 DIAGNOSIS — N50819 Testicular pain, unspecified: Secondary | ICD-10-CM

## 2022-03-27 LAB — BLADDER SCAN AMB NON-IMAGING: Scan Result: 141

## 2022-03-27 MED ORDER — SILODOSIN 8 MG PO CAPS: 8.0000 mg | ORAL_CAPSULE | Freq: Every day | ORAL | 3 refills | Status: DC

## 2022-03-27 NOTE — Patient Instructions (Signed)

## 2022-03-27 NOTE — Progress Notes (Unsigned)
post void residual=141

## 2022-03-27 NOTE — Progress Notes (Unsigned)
03/27/2022 11:35 AM   Justin [name] [birth year] [id number]  Referring provider: Monico Blitz, MD [address] [place],  [place] [address]  No chief complaint on file.   HPI:    PMH: Past Medical History:  Diagnosis Date   AF (atrial fibrillation) (HCC)    Anxiety and depression    Hypertension    Hypothyroidism    Obesity    Palpitations    SVT (supraventricular tachycardia) ([facility] [facility])     Surgical History: Past Surgical History:  Procedure Laterality Date   CARDIOVASCULAR STRESS TEST  09/05/2007   CHOLECYSTECTOMY     CYSTOSCOPY WITH INSERTION OF UROLIFT N/A 08/13/2017   Procedure: CYSTOSCOPY WITH INSERTION OF UROLIFT;  Surgeon: Cleon Gustin, MD;  Location: [facility];  Service: Urology;  Laterality: N/A;   PAROTIDECTOMY  08/07/2018   PAROTIDECTOMY Right 08/07/2018   Procedure: PAROTIDECTOMY;  Surgeon: Jerrell Belfast, MD;  Location: [facility];  Service: ENT;  Laterality: Right;   TONSILLECTOMY      Home Medications:  Allergies as of 03/27/2022       Reactions   Halcion [triazolam] Other (See Comments)   Broke the hospital bed        Medication List        Accurate as of March 27, 2022 11:35 AM. If you have any questions, ask your nurse or doctor.          albuterol 108 (90 Base) MCG/ACT inhaler Commonly known as: VENTOLIN HFA Inhale 2 puffs into the lungs 4 (four) times daily.   ALPRAZolam 1 MG tablet Commonly known as: XANAX Take 1 mg by mouth 3 (three) times daily.   cetirizine 10 MG tablet Commonly known as: ZYRTEC Take 10 mg by mouth daily.   doxycycline 100 MG capsule Commonly known as: VIBRAMYCIN Take 1 capsule (100 mg total) by mouth 2 (two) times daily.   furosemide 40 MG tablet Commonly known as: LASIX Take 40 mg by mouth 2 (two) times daily as needed (swelling feet/fluid retention.).   levothyroxine 137 MCG tablet Commonly known as: SYNTHROID Take 137 mcg by mouth daily.   metFORMIN 500 MG tablet Commonly known as:  GLUCOPHAGE Take 250 mg by mouth daily.   metoprolol succinate 25 MG 24 hr tablet Commonly known as: TOPROL-XL Take 1 tablet (25 mg total) by mouth daily.   nitroGLYCERIN 0.4 MG SL tablet Commonly known as: NITROSTAT Place 1 tablet (0.4 mg total) under the tongue every 5 (five) minutes as needed for chest pain.   PARoxetine 20 MG tablet Commonly known as: PAXIL Take 10 mg by mouth daily.   potassium chloride 10 MEQ tablet Commonly known as: KLOR-CON M Take 1 tablet (10 mEq total) by mouth daily.   silodosin 8 MG Caps capsule Commonly known as: RAPAFLO Take 1 capsule (8 mg total) by mouth at bedtime.   sulfamethoxazole-trimethoprim 800-160 MG tablet Commonly known as: BACTRIM DS Take 1 tablet by mouth every 12 (twelve) hours.   Symbicort 160-4.5 MCG/ACT inhaler Generic drug: budesonide-formoterol Inhale 2 puffs into the lungs 2 (two) times daily.        Allergies:  Allergies  Allergen Reactions   Halcion [Triazolam] Other (See Comments)    Broke the hospital bed    Family History: Family History  Problem Relation Age of Onset   Heart disease Mother    Heart disease Father     Social History:  reports that he has quit smoking. He has quit using smokeless tobacco.  His smokeless tobacco  use included snuff. He reports that he does not drink alcohol and does not use drugs.  ROS: All other review of systems were reviewed and are negative except what is noted above in HPI  Physical Exam: BP 97/61   Pulse 80   Constitutional:  Alert and oriented, No acute distress. HEENT: [place] AT, moist mucus membranes.  Trachea midline, no masses. Cardiovascular: No clubbing, cyanosis, or edema. Respiratory: Normal respiratory effort, no increased work of breathing. GI: Abdomen is soft, nontender, nondistended, no abdominal masses GU: No CVA tenderness.  Lymph: No cervical or inguinal lymphadenopathy. Skin: No rashes, bruises or suspicious lesions. Neurologic: Grossly intact, no  focal deficits, moving all 4 extremities. Psychiatric: Normal mood and affect.  Laboratory Data: Lab Results  Component Value Date   WBC 7.1 11/10/2019   HGB 15.9 11/10/2019   HCT 46.3 11/10/2019   MCV 98.3 11/10/2019   PLT 125 (L) 11/10/2019    Lab Results  Component Value Date   CREATININE 1.18 11/10/2019    No results found for: "PSA"  No results found for: "TESTOSTERONE"  No results found for: "HGBA1C"  Urinalysis    Component Value Date/Time   COLORURINE YELLOW 11/20/2016 2039   APPEARANCEUR Clear 09/27/2021 0935   LABSPEC 1.015 11/20/2016 2039   PHURINE 5.0 11/20/2016 2039   GLUCOSEU Negative 09/27/2021 0935   HGBUR NEGATIVE 11/20/2016 2039   BILIRUBINUR Negative 09/27/2021 0935   KETONESUR NEGATIVE 11/20/2016 2039   PROTEINUR Negative 09/27/2021 0935   PROTEINUR NEGATIVE 11/20/2016 2039   NITRITE Negative 09/27/2021 0935   NITRITE NEGATIVE 11/20/2016 2039   LEUKOCYTESUR Negative 09/27/2021 0935    Lab Results  Component Value Date   LABMICR See below: 09/27/2021   WBCUA None seen 09/27/2021   LABEPIT None seen 09/27/2021   MUCUS Present 11/10/2020   BACTERIA None seen 09/27/2021    Pertinent Imaging: *** No results found for this or any previous visit.  No results found for this or any previous visit.  No results found for this or any previous visit.  No results found for this or any previous visit.  No results found for this or any previous visit.  No valid procedures specified. No results found for this or any previous visit.  No results found for this or any previous visit.   Assessment & Plan:    1. Benign prostatic hyperplasia with urinary obstruction -continue rapaflo '8mg'$  - Urinalysis, Routine w reflex microscopic - BLADDER SCAN AMB NON-IMAGING  2. Weak urinary stream -continue rapaflo '8mg'$  daily  3. Pain in testicle, unspecified laterality ***   No follow-ups on file.  Nicolette Bang, MD  [facility]  [place]

## 2022-03-28 ENCOUNTER — Encounter: Payer: Self-pay | Admitting: Urology

## 2022-03-28 LAB — URINALYSIS, ROUTINE W REFLEX MICROSCOPIC
Bilirubin, UA: NEGATIVE
Glucose, UA: NEGATIVE
Ketones, UA: NEGATIVE
Leukocytes,UA: NEGATIVE
Nitrite, UA: NEGATIVE
Protein,UA: NEGATIVE
RBC, UA: NEGATIVE
Specific Gravity, UA: 1.01 (ref 1.005–1.030)
Urobilinogen, Ur: 0.2 mg/dL (ref 0.2–1.0)
pH, UA: 5.5 (ref 5.0–7.5)

## 2022-05-10 DIAGNOSIS — I10 Essential (primary) hypertension: Secondary | ICD-10-CM | POA: Diagnosis not present

## 2022-05-10 DIAGNOSIS — J302 Other seasonal allergic rhinitis: Secondary | ICD-10-CM | POA: Diagnosis not present

## 2022-05-10 DIAGNOSIS — E1142 Type 2 diabetes mellitus with diabetic polyneuropathy: Secondary | ICD-10-CM | POA: Diagnosis not present

## 2022-05-10 DIAGNOSIS — Z299 Encounter for prophylactic measures, unspecified: Secondary | ICD-10-CM | POA: Diagnosis not present

## 2022-05-10 DIAGNOSIS — E1165 Type 2 diabetes mellitus with hyperglycemia: Secondary | ICD-10-CM | POA: Diagnosis not present

## 2022-06-02 DIAGNOSIS — R030 Elevated blood-pressure reading, without diagnosis of hypertension: Secondary | ICD-10-CM | POA: Diagnosis not present

## 2022-06-02 DIAGNOSIS — J019 Acute sinusitis, unspecified: Secondary | ICD-10-CM | POA: Diagnosis not present

## 2022-06-15 DIAGNOSIS — H6991 Unspecified Eustachian tube disorder, right ear: Secondary | ICD-10-CM | POA: Diagnosis not present

## 2022-06-15 DIAGNOSIS — H938X1 Other specified disorders of right ear: Secondary | ICD-10-CM | POA: Diagnosis not present

## 2022-07-06 ENCOUNTER — Other Ambulatory Visit: Payer: Self-pay | Admitting: Internal Medicine

## 2022-07-31 DIAGNOSIS — I700 Atherosclerosis of aorta: Secondary | ICD-10-CM | POA: Diagnosis not present

## 2022-07-31 DIAGNOSIS — R6889 Other general symptoms and signs: Secondary | ICD-10-CM | POA: Diagnosis not present

## 2022-07-31 DIAGNOSIS — R0981 Nasal congestion: Secondary | ICD-10-CM | POA: Diagnosis not present

## 2022-07-31 DIAGNOSIS — M25562 Pain in left knee: Secondary | ICD-10-CM | POA: Diagnosis not present

## 2022-07-31 DIAGNOSIS — M1990 Unspecified osteoarthritis, unspecified site: Secondary | ICD-10-CM | POA: Diagnosis not present

## 2022-07-31 DIAGNOSIS — M25552 Pain in left hip: Secondary | ICD-10-CM | POA: Diagnosis not present

## 2022-07-31 DIAGNOSIS — M25551 Pain in right hip: Secondary | ICD-10-CM | POA: Diagnosis not present

## 2022-07-31 DIAGNOSIS — Z299 Encounter for prophylactic measures, unspecified: Secondary | ICD-10-CM | POA: Diagnosis not present

## 2022-07-31 DIAGNOSIS — M7651 Patellar tendinitis, right knee: Secondary | ICD-10-CM | POA: Diagnosis not present

## 2022-07-31 DIAGNOSIS — M160 Bilateral primary osteoarthritis of hip: Secondary | ICD-10-CM | POA: Diagnosis not present

## 2022-07-31 DIAGNOSIS — M25561 Pain in right knee: Secondary | ICD-10-CM | POA: Diagnosis not present

## 2022-07-31 DIAGNOSIS — M170 Bilateral primary osteoarthritis of knee: Secondary | ICD-10-CM | POA: Diagnosis not present

## 2022-07-31 DIAGNOSIS — M545 Low back pain, unspecified: Secondary | ICD-10-CM | POA: Diagnosis not present

## 2022-07-31 DIAGNOSIS — M7652 Patellar tendinitis, left knee: Secondary | ICD-10-CM | POA: Diagnosis not present

## 2022-08-03 ENCOUNTER — Other Ambulatory Visit: Payer: Self-pay | Admitting: Internal Medicine

## 2022-09-20 DIAGNOSIS — Z7189 Other specified counseling: Secondary | ICD-10-CM | POA: Diagnosis not present

## 2022-09-20 DIAGNOSIS — R5383 Other fatigue: Secondary | ICD-10-CM | POA: Diagnosis not present

## 2022-09-20 DIAGNOSIS — I10 Essential (primary) hypertension: Secondary | ICD-10-CM | POA: Diagnosis not present

## 2022-09-20 DIAGNOSIS — E039 Hypothyroidism, unspecified: Secondary | ICD-10-CM | POA: Diagnosis not present

## 2022-09-20 DIAGNOSIS — E7800 Pure hypercholesterolemia, unspecified: Secondary | ICD-10-CM | POA: Diagnosis not present

## 2022-09-20 DIAGNOSIS — Z299 Encounter for prophylactic measures, unspecified: Secondary | ICD-10-CM | POA: Diagnosis not present

## 2022-09-20 DIAGNOSIS — E1165 Type 2 diabetes mellitus with hyperglycemia: Secondary | ICD-10-CM | POA: Diagnosis not present

## 2022-09-20 DIAGNOSIS — Z0000 Encounter for general adult medical examination without abnormal findings: Secondary | ICD-10-CM | POA: Diagnosis not present

## 2022-09-21 DIAGNOSIS — Z79899 Other long term (current) drug therapy: Secondary | ICD-10-CM | POA: Diagnosis not present

## 2022-09-21 DIAGNOSIS — R5383 Other fatigue: Secondary | ICD-10-CM | POA: Diagnosis not present

## 2022-09-21 DIAGNOSIS — E7800 Pure hypercholesterolemia, unspecified: Secondary | ICD-10-CM | POA: Diagnosis not present

## 2022-09-21 DIAGNOSIS — E039 Hypothyroidism, unspecified: Secondary | ICD-10-CM | POA: Diagnosis not present

## 2022-10-03 ENCOUNTER — Ambulatory Visit (INDEPENDENT_AMBULATORY_CARE_PROVIDER_SITE_OTHER): Payer: 59 | Admitting: General Surgery

## 2022-10-03 ENCOUNTER — Encounter: Payer: Self-pay | Admitting: General Surgery

## 2022-10-03 VITALS — BP 118/74 | HR 100 | Temp 98.4°F | Resp 16 | Ht 70.5 in | Wt 255.0 lb

## 2022-10-03 DIAGNOSIS — K4090 Unilateral inguinal hernia, without obstruction or gangrene, not specified as recurrent: Secondary | ICD-10-CM | POA: Diagnosis not present

## 2022-10-04 NOTE — Progress Notes (Signed)
Justin [name]; [id number]; 09-09-1950   HPI Patient is a 72 year old white male who returns back to my care for evaluation and treatment of a right inguinal hernia.  I last saw him in my office in 2022.  At that time, he had an acute exacerbation of chronic bronchitis.  He states this has since resolved.  He continues to have pain in the right groin region extending down to the scrotum.  He also describes a right buttock pain going posteriorly along the right leg.  He has not noticed a lump.  He states the pain is made worse with straining or extended movement. Past Medical History:  Diagnosis Date   AF (atrial fibrillation) (HCC)    Anxiety and depression    Hypertension    Hypothyroidism    Obesity    Palpitations    SVT (supraventricular tachycardia)     Past Surgical History:  Procedure Laterality Date   CARDIOVASCULAR STRESS TEST  09/05/2007   CHOLECYSTECTOMY     CYSTOSCOPY WITH INSERTION OF UROLIFT N/A 08/13/2017   Procedure: CYSTOSCOPY WITH INSERTION OF UROLIFT;  Surgeon: Malen Gauze, MD;  Location: [facility];  Service: Urology;  Laterality: N/A;   PAROTIDECTOMY  08/07/2018   PAROTIDECTOMY Right 08/07/2018   Procedure: PAROTIDECTOMY;  Surgeon: Osborn Coho, MD;  Location: [facility];  Service: ENT;  Laterality: Right;   TONSILLECTOMY      Family History  Problem Relation Age of Onset   Heart disease Mother    Heart disease Father     Current Outpatient Medications on File Prior to Visit  Medication Sig Dispense Refill   albuterol (VENTOLIN HFA) 108 (90 Base) MCG/ACT inhaler Inhale 2 puffs into the lungs 4 (four) times daily.     ALPRAZolam (XANAX) 1 MG tablet Take 1 mg by mouth 3 (three) times daily.      cetirizine (ZYRTEC) 10 MG tablet Take 10 mg by mouth daily.     furosemide (LASIX) 40 MG tablet Take 40 mg by mouth 2 (two) times daily as needed (swelling feet/fluid retention.).      levothyroxine (SYNTHROID) 137 MCG tablet Take 137 mcg by mouth daily.      metFORMIN (GLUCOPHAGE) 500 MG tablet Take 250 mg by mouth daily.     metoprolol succinate (TOPROL-XL) 25 MG 24 hr tablet Take 1 tablet (25 mg total) by mouth daily. 90 tablet 3   nitroGLYCERIN (NITROSTAT) 0.4 MG SL tablet Place 1 tablet (0.4 mg total) under the tongue every 5 (five) minutes as needed for chest pain. 25 tablet 3   PARoxetine (PAXIL) 20 MG tablet Take 10 mg by mouth daily.      potassium chloride (KLOR-CON M) 10 MEQ tablet TAKE 1 TABLET BY MOUTH EVERY DAY 30 tablet 2   silodosin (RAPAFLO) 8 MG CAPS capsule Take 1 capsule (8 mg total) by mouth at bedtime. 90 capsule 3   SYMBICORT 160-4.5 MCG/ACT inhaler Inhale 2 puffs into the lungs 2 (two) times daily.     No current facility-administered medications on file prior to visit.    Allergies  Allergen Reactions   Halcion [Triazolam] Other (See Comments)    Broke the hospital bed    Social History   Substance and Sexual Activity  Alcohol Use No    Social History   Tobacco Use  Smoking Status Former  Smokeless Tobacco Former   Types: Snuff    Review of Systems  Constitutional:  Positive for malaise/fatigue.  HENT:  Positive for sinus pain.  Eyes: Negative.   Respiratory: Negative.    Cardiovascular: Negative.   Gastrointestinal:  Positive for abdominal pain.  Genitourinary:  Positive for frequency.  Musculoskeletal:  Positive for back pain, joint pain and neck pain.  Skin: Negative.   Neurological:  Positive for tremors and sensory change.  Endo/Heme/Allergies: Negative.   Psychiatric/Behavioral: Negative.      Objective   Vitals:   10/03/22 1351  BP: 118/74  Pulse: 100  Resp: 16  Temp: 98.4 F (36.9 C)  SpO2: 95%    Physical Exam Vitals reviewed.  Constitutional:      Appearance: Normal appearance. He is obese. He is not ill-appearing.  HENT:     Head: Normocephalic and atraumatic.  Cardiovascular:     Rate and Rhythm: Normal rate and regular rhythm.     Heart sounds: Normal heart sounds. No  murmur heard.    No friction rub. No gallop.  Pulmonary:     Effort: Pulmonary effort is normal. No respiratory distress.     Breath sounds: Normal breath sounds. No stridor. No wheezing, rhonchi or rales.  Abdominal:     General: Bowel sounds are normal. There is no distension.     Palpations: Abdomen is soft. There is no mass.     Tenderness: There is no abdominal tenderness. There is no guarding or rebound.     Hernia: A hernia is present.     Comments: Small right inguinal hernia with tenderness over the internal ring.  No left inguinal hernia appreciated.  Genitourinary:    Testes: Normal.  Skin:    General: Skin is warm and dry.  Neurological:     Mental Status: He is alert and oriented to person, place, and time.   Previous office notes reviewed  Assessment  Right inguinal hernia Plan  Patient is scheduled for robotic assisted laparoscopic right inguinal herniorrhaphy with mesh on 11/06/2022.  The risks and benefits of the procedure including bleeding, infection, mesh use, and the possibility of recurrence of the hernia were fully explained to the patient, who gave informed consent.

## 2022-10-06 NOTE — Addendum Note (Signed)
Addended by: Phillips Odor on: 10/06/2022 11:28 AM   Modules accepted: Orders

## 2022-10-12 NOTE — H&P (Signed)
Justin [name]; [id number]; 09-09-1950   HPI Patient is a 72 year old white male who returns back to my care for evaluation and treatment of a right inguinal hernia.  I last saw him in my office in 2022.  At that time, he had an acute exacerbation of chronic bronchitis.  He states this has since resolved.  He continues to have pain in the right groin region extending down to the scrotum.  He also describes a right buttock pain going posteriorly along the right leg.  He has not noticed a lump.  He states the pain is made worse with straining or extended movement. Past Medical History:  Diagnosis Date   AF (atrial fibrillation) (HCC)    Anxiety and depression    Hypertension    Hypothyroidism    Obesity    Palpitations    SVT (supraventricular tachycardia)     Past Surgical History:  Procedure Laterality Date   CARDIOVASCULAR STRESS TEST  09/05/2007   CHOLECYSTECTOMY     CYSTOSCOPY WITH INSERTION OF UROLIFT N/A 08/13/2017   Procedure: CYSTOSCOPY WITH INSERTION OF UROLIFT;  Surgeon: Malen Gauze, MD;  Location: [facility];  Service: Urology;  Laterality: N/A;   PAROTIDECTOMY  08/07/2018   PAROTIDECTOMY Right 08/07/2018   Procedure: PAROTIDECTOMY;  Surgeon: Osborn Coho, MD;  Location: [facility] OR;  Service: ENT;  Laterality: Right;   TONSILLECTOMY      Family History  Problem Relation Age of Onset   Heart disease Mother    Heart disease Father     Current Outpatient Medications on File Prior to Visit  Medication Sig Dispense Refill   albuterol (VENTOLIN HFA) 108 (90 Base) MCG/ACT inhaler Inhale 2 puffs into the lungs 4 (four) times daily.     ALPRAZolam (XANAX) 1 MG tablet Take 1 mg by mouth 3 (three) times daily.      cetirizine (ZYRTEC) 10 MG tablet Take 10 mg by mouth daily.     furosemide (LASIX) 40 MG tablet Take 40 mg by mouth 2 (two) times daily as needed (swelling feet/fluid retention.).      levothyroxine (SYNTHROID) 137 MCG tablet Take 137 mcg by mouth daily.      metFORMIN (GLUCOPHAGE) 500 MG tablet Take 250 mg by mouth daily.     metoprolol succinate (TOPROL-XL) 25 MG 24 hr tablet Take 1 tablet (25 mg total) by mouth daily. 90 tablet 3   nitroGLYCERIN (NITROSTAT) 0.4 MG SL tablet Place 1 tablet (0.4 mg total) under the tongue every 5 (five) minutes as needed for chest pain. 25 tablet 3   PARoxetine (PAXIL) 20 MG tablet Take 10 mg by mouth daily.      potassium chloride (KLOR-CON M) 10 MEQ tablet TAKE 1 TABLET BY MOUTH EVERY DAY 30 tablet 2   silodosin (RAPAFLO) 8 MG CAPS capsule Take 1 capsule (8 mg total) by mouth at bedtime. 90 capsule 3   SYMBICORT 160-4.5 MCG/ACT inhaler Inhale 2 puffs into the lungs 2 (two) times daily.     No current facility-administered medications on file prior to visit.    Allergies  Allergen Reactions   Halcion [Triazolam] Other (See Comments)    Broke the hospital bed    Social History   Substance and Sexual Activity  Alcohol Use No    Social History   Tobacco Use  Smoking Status Former  Smokeless Tobacco Former   Types: Snuff    Review of Systems  Constitutional:  Positive for malaise/fatigue.  HENT:  Positive for sinus pain.  Eyes: Negative.   Respiratory: Negative.    Cardiovascular: Negative.   Gastrointestinal:  Positive for abdominal pain.  Genitourinary:  Positive for frequency.  Musculoskeletal:  Positive for back pain, joint pain and neck pain.  Skin: Negative.   Neurological:  Positive for tremors and sensory change.  Endo/Heme/Allergies: Negative.   Psychiatric/Behavioral: Negative.      Objective   Vitals:   10/03/22 1351  BP: 118/74  Pulse: 100  Resp: 16  Temp: 98.4 F (36.9 C)  SpO2: 95%    Physical Exam Vitals reviewed.  Constitutional:      Appearance: Normal appearance. He is obese. He is not ill-appearing.  HENT:     Head: Normocephalic and atraumatic.  Cardiovascular:     Rate and Rhythm: Normal rate and regular rhythm.     Heart sounds: Normal heart sounds. No  murmur heard.    No friction rub. No gallop.  Pulmonary:     Effort: Pulmonary effort is normal. No respiratory distress.     Breath sounds: Normal breath sounds. No stridor. No wheezing, rhonchi or rales.  Abdominal:     General: Bowel sounds are normal. There is no distension.     Palpations: Abdomen is soft. There is no mass.     Tenderness: There is no abdominal tenderness. There is no guarding or rebound.     Hernia: A hernia is present.     Comments: Small right inguinal hernia with tenderness over the internal ring.  No left inguinal hernia appreciated.  Genitourinary:    Testes: Normal.  Skin:    General: Skin is warm and dry.  Neurological:     Mental Status: He is alert and oriented to person, place, and time.   Previous office notes reviewed  Assessment  Right inguinal hernia Plan  Patient is scheduled for robotic assisted laparoscopic right inguinal herniorrhaphy with mesh on 11/06/2022.  The risks and benefits of the procedure including bleeding, infection, mesh use, and the possibility of recurrence of the hernia were fully explained to the patient, who gave informed consent.

## 2022-10-31 ENCOUNTER — Other Ambulatory Visit: Payer: Self-pay | Admitting: Internal Medicine

## 2022-11-01 NOTE — Patient Instructions (Signed)
Your procedure is scheduled on: 11/06/2022  Report to [facility] Main Entrance at   7:45  AM.  Call this number if you have problems the morning of surgery: [phone]   Remember:   Do not Eat or Drink after midnight         No Smoking the morning of surgery  :  Take these medicines the morning of surgery with A SIP OF WATER: Levothyroxine, metoprolol, Paxil, and xanax if needed  Use symbicort inhaler   Do not wear jewelry, make-up or nail polish.  Do not wear lotions, powders, or perfumes. You may wear deodorant.  Do not shave 48 hours prior to surgery. Men may shave face and neck.  Do not bring valuables to the hospital.  Contacts, dentures or bridgework may not be worn into surgery.  Leave suitcase in the car. After surgery it may be brought to your room.  For patients admitted to the hospital, checkout time is 11:00 AM the day of discharge.   Patients discharged the day of surgery will not be allowed to drive home.    Special Instructions: Shower using CHG night before surgery and shower the day of surgery use CHG.  Use special wash - you have one bottle of CHG for all showers.  You should use approximately 1/2 of the bottle for each shower. How to Use Chlorhexidine Before Surgery Chlorhexidine gluconate (CHG) is a germ-killing (antiseptic) solution that is used to clean the skin. It can get rid of the bacteria that normally live on the skin and can keep them away for about 24 hours. To clean your skin with CHG, you may be given: A CHG solution to use in the shower or as part of a sponge bath. A prepackaged cloth that contains CHG. Cleaning your skin with CHG may help lower the risk for infection: While you are staying in the intensive care unit of the hospital. If you have a vascular access, such as a central line, to provide short-term or long-term access to your veins. If you have a catheter to drain urine from your bladder. If you are on a ventilator. A ventilator is a  machine that helps you breathe by moving air in and out of your lungs. After surgery. What are the risks? Risks of using CHG include: A skin reaction. Hearing loss, if CHG gets in your ears and you have a perforated eardrum. Eye injury, if CHG gets in your eyes and is not rinsed out. The CHG product catching fire. Make sure that you avoid smoking and flames after applying CHG to your skin. Do not use CHG: If you have a chlorhexidine allergy or have previously reacted to chlorhexidine. On babies younger than 1 months of age. How to use CHG solution Use CHG only as told by your health care provider, and follow the instructions on the label. Use the full amount of CHG as directed. Usually, this is one bottle. During a shower Follow these steps when using CHG solution during a shower (unless your health care provider gives you different instructions): Start the shower. Use your normal soap and shampoo to wash your face and hair. Turn off the shower or move out of the shower stream. Pour the CHG onto a clean washcloth. Do not use any type of brush or rough-edged sponge. Starting at your neck, lather your body down to your toes. Make sure you follow these instructions: If you will be having surgery, pay special attention to the part of  your body where you will be having surgery. Scrub this area for at least 1 minute. Do not use CHG on your head or face. If the solution gets into your ears or eyes, rinse them well with water. Avoid your genital area. Avoid any areas of skin that have broken skin, cuts, or scrapes. Scrub your back and under your arms. Make sure to wash skin folds. Let the lather sit on your skin for 1-2 minutes or as long as told by your health care provider. Thoroughly rinse your entire body in the shower. Make sure that all body creases and crevices are rinsed well. Dry off with a clean towel. Do not put any substances on your body afterward--such as powder, lotion, or  perfume--unless you are told to do so by your health care provider. Only use lotions that are recommended by the manufacturer. Put on clean clothes or pajamas. If it is the night before your surgery, sleep in clean sheets.  During a sponge bath Follow these steps when using CHG solution during a sponge bath (unless your health care provider gives you different instructions): Use your normal soap and shampoo to wash your face and hair. Pour the CHG onto a clean washcloth. Starting at your neck, lather your body down to your toes. Make sure you follow these instructions: If you will be having surgery, pay special attention to the part of your body where you will be having surgery. Scrub this area for at least 1 minute. Do not use CHG on your head or face. If the solution gets into your ears or eyes, rinse them well with water. Avoid your genital area. Avoid any areas of skin that have broken skin, cuts, or scrapes. Scrub your back and under your arms. Make sure to wash skin folds. Let the lather sit on your skin for 1-2 minutes or as long as told by your health care provider. Using a different clean, wet washcloth, thoroughly rinse your entire body. Make sure that all body creases and crevices are rinsed well. Dry off with a clean towel. Do not put any substances on your body afterward--such as powder, lotion, or perfume--unless you are told to do so by your health care provider. Only use lotions that are recommended by the manufacturer. Put on clean clothes or pajamas. If it is the night before your surgery, sleep in clean sheets. How to use CHG prepackaged cloths Only use CHG cloths as told by your health care provider, and follow the instructions on the label. Use the CHG cloth on clean, dry skin. Do not use the CHG cloth on your head or face unless your health care provider tells you to. When washing with the CHG cloth: Avoid your genital area. Avoid any areas of skin that have broken  skin, cuts, or scrapes. Before surgery Follow these steps when using a CHG cloth to clean before surgery (unless your health care provider gives you different instructions): Using the CHG cloth, vigorously scrub the part of your body where you will be having surgery. Scrub using a back-and-forth motion for 3 minutes. The area on your body should be completely wet with CHG when you are done scrubbing. Do not rinse. Discard the cloth and let the area air-dry. Do not put any substances on the area afterward, such as powder, lotion, or perfume. Put on clean clothes or pajamas. If it is the night before your surgery, sleep in clean sheets.  For general bathing Follow these steps when using CHG  cloths for general bathing (unless your health care provider gives you different instructions). Use a separate CHG cloth for each area of your body. Make sure you wash between any folds of skin and between your fingers and toes. Wash your body in the following order, switching to a new cloth after each step: The front of your neck, shoulders, and chest. Both of your arms, under your arms, and your hands. Your stomach and groin area, avoiding the genitals. Your right leg and foot. Your left leg and foot. The back of your neck, your back, and your buttocks. Do not rinse. Discard the cloth and let the area air-dry. Do not put any substances on your body afterward--such as powder, lotion, or perfume--unless you are told to do so by your health care provider. Only use lotions that are recommended by the manufacturer. Put on clean clothes or pajamas. Contact a health care provider if: Your skin gets irritated after scrubbing. You have questions about using your solution or cloth. You swallow any chlorhexidine. Call your local poison control center ([phone] in the [place] Get help right away if: Your eyes itch badly, or they become very red or swollen. Your skin itches badly and is red or swollen. Your  hearing changes. You have trouble seeing. You have swelling or tingling in your mouth or throat. You have trouble breathing. These symptoms may represent a serious problem that is an emergency. Do not wait to see if the symptoms will go away. Get medical help right away. Call your local emergency services (911 in the [place] Do not drive yourself to the hospital. Summary Chlorhexidine gluconate (CHG) is a germ-killing (antiseptic) solution that is used to clean the skin. Cleaning your skin with CHG may help to lower your risk for infection. You may be given CHG to use for bathing. It may be in a bottle or in a prepackaged cloth to use on your skin. Carefully follow your health care provider's instructions and the instructions on the product label. Do not use CHG if you have a chlorhexidine allergy. Contact your health care provider if your skin gets irritated after scrubbing. This information is not intended to replace advice given to you by your health care provider. Make sure you discuss any questions you have with your health care provider. Document Revised: 05/16/2021 Document Reviewed: 03/29/2020 Elsevier Patient Education  2023 [employer]  Laparoscopic Inguinal Hernia Repair, Adult, Care After The following information offers guidance on how to care for yourself after your procedure. Your health care provider may also give you more specific instructions. If you have problems or questions, contact your health care provider. What can I expect after the procedure? After the procedure, it is common to have: Pain. Swelling and bruising around the incision area. Scrotal swelling, in males. Some fluid or blood draining from your incisions. Follow these instructions at home: Medicines Take over-the-counter and prescription medicines only as told by your health care provider. Ask your health care provider if the medicine prescribed to you: Requires you to avoid driving or using  machinery. Can cause constipation. You may need to take these actions to prevent or treat constipation: Drink enough fluid to keep your urine pale yellow. Take over-the-counter or prescription medicines. Eat foods that are high in fiber, such as beans, whole grains, and fresh fruits and vegetables. Limit foods that are high in fat and processed sugars, such as fried or sweet foods. Incision care  Follow instructions from your health care provider about how  to take care of your incisions. Make sure you: Wash your hands with soap and water for at least 20 seconds before and after you change your bandage (dressing). If soap and water are not available, use hand sanitizer. Change your dressing as told by your health care provider. Leave stitches (sutures), skin glue, or adhesive strips in place. These skin closures may need to stay in place for 2 weeks or longer. If adhesive strip edges start to loosen and curl up, you may trim the loose edges. Do not remove adhesive strips completely unless your health care provider tells you to do that. Check your incision area every day for signs of infection. Check for: More redness, swelling, or pain. More fluid or blood. Warmth. Pus or a bad smell. Wear loose, soft clothing while your incisions heal. Managing pain and swelling If directed, put ice on the painful or swollen areas. To do this: Put ice in a plastic bag. Place a towel between your skin and the bag. Leave the ice on for 20 minutes, 2-3 times a day. Remove the ice if your skin turns bright red. This is very important. If you cannot feel pain, heat, or cold, you have a greater risk of damage to the area.  Activity Do not lift anything that is heavier than 10 lb (4.5 kg), or the limit that you are told, until your health care provider says that it is safe. Ask your health care provider what activities are safe for you. A lot of activity during the first week after surgery can increase pain and  swelling. For 1 week after your procedure: Avoid activities that take a lot of effort, such as exercise or sports. You may walk and climb stairs as needed for daily activity, but avoid long walks or climbing stairs for exercise. General instructions If you were given a sedative during the procedure, it can affect you for several hours. Do not drive or operate machinery until your health care provider says that it is safe. Do not take baths, swim, or use a hot tub until your health care provider approves. Ask your health care provider if you may take showers. You may only be allowed to take sponge baths. Do not use any products that contain nicotine or tobacco. These products include cigarettes, chewing tobacco, and vaping devices, such as e-cigarettes. If you need help quitting, ask your health care provider. Keep all follow-up visits. This is important. Contact a health care provider if: You have any of these signs of infection: More redness, swelling, or pain around your incisions or your groin area. More fluid or blood coming from an incision. Warmth coming from an incision. Pus or a bad smell coming from an incision. A fever or chills. You have more swelling in your scrotum, if you are male. You have severe pain and medicines do not help. You have abdominal pain or swelling. You cannot urinate or have a bowel movement. You faint or feel dizzy. You have nausea and vomiting. Get help right away if: You have redness, warmth, or pain in your leg. You have chest pain. You have problems breathing. These symptoms may represent a serious problem that is an emergency. Do not wait to see if the symptoms will go away. Get medical help right away. Call your local emergency services (911 in the [place] Do not drive yourself to the hospital. Summary Pain, swelling, and bruising are common after the procedure. Check your incision area every day for signs of infection,  such as more redness, swelling,  or pain. Put ice on painful or swollen areas for 20 minutes, 2-3 times a day. This information is not intended to replace advice given to you by your health care provider. Make sure you discuss any questions you have with your health care provider. Document Revised: 09/16/2019 Document Reviewed: 09/16/2019 Elsevier Patient Education  2024 Else[employer] Inc.  General Anesthesia, Adult, Care After The following information offers guidance on how to care for yourself after your procedure. Your health care provider may also give you more specific instructions. If you have problems or questions, contact your health care provider. What can I expect after the procedure? After the procedure, it is common for people to: Have pain or discomfort at the IV site. Have nausea or vomiting. Have a sore throat or hoarseness. Have trouble concentrating. Feel cold or chills. Feel weak, sleepy, or tired (fatigue). Have soreness and body aches. These can affect parts of the body that were not involved in surgery. Follow these instructions at home: For the time period you were told by your health care provider:  Rest. Do not participate in activities where you could fall or become injured. Do not drive or use machinery. Do not drink alcohol. Do not take sleeping pills or medicines that cause drowsiness. Do not make important decisions or sign legal documents. Do not take care of children on your own. General instructions Drink enough fluid to keep your urine pale yellow. If you have sleep apnea, surgery and certain medicines can increase your risk for breathing problems. Follow instructions from your health care provider about wearing your sleep device: Anytime you are sleeping, including during daytime naps. While taking prescription pain medicines, sleeping medicines, or medicines that make you drowsy. Return to your normal activities as told by your health care provider. Ask your health care provider what  activities are safe for you. Take over-the-counter and prescription medicines only as told by your health care provider. Do not use any products that contain nicotine or tobacco. These products include cigarettes, chewing tobacco, and vaping devices, such as e-cigarettes. These can delay incision healing after surgery. If you need help quitting, ask your health care provider. Contact a health care provider if: You have nausea or vomiting that does not get better with medicine. You vomit every time you eat or drink. You have pain that does not get better with medicine. You cannot urinate or have bloody urine. You develop a skin rash. You have a fever. Get help right away if: You have trouble breathing. You have chest pain. You vomit blood. These symptoms may be an emergency. Get help right away. Call 911. Do not wait to see if the symptoms will go away. Do not drive yourself to the hospital. Summary After the procedure, it is common to have a sore throat, hoarseness, nausea, vomiting, or to feel weak, sleepy, or fatigue. For the time period you were told by your health care provider, do not drive or use machinery. Get help right away if you have difficulty breathing, have chest pain, or vomit blood. These symptoms may be an emergency. This information is not intended to replace advice given to you by your health care provider. Make sure you discuss any questions you have with your health care provider. Document Revised: 04/15/2021 Document Reviewed: 04/15/2021 Elsevier Patient Education  2024 [employer].

## 2022-11-02 ENCOUNTER — Encounter (HOSPITAL_COMMUNITY)
Admission: RE | Admit: 2022-11-02 | Discharge: 2022-11-02 | Disposition: A | Discharge status: Home / Self Care | Payer: 59 | Source: Ambulatory Visit | Attending: General Surgery | Admitting: General Surgery

## 2022-11-02 VITALS — BP 119/62 | HR 59 | Temp 97.8°F | Resp 18 | Ht 70.5 in | Wt 255.1 lb

## 2022-11-02 DIAGNOSIS — I4891 Unspecified atrial fibrillation: Secondary | ICD-10-CM | POA: Insufficient documentation

## 2022-11-02 DIAGNOSIS — Z79899 Other long term (current) drug therapy: Secondary | ICD-10-CM | POA: Diagnosis not present

## 2022-11-02 DIAGNOSIS — Z01810 Encounter for preprocedural cardiovascular examination: Secondary | ICD-10-CM | POA: Insufficient documentation

## 2022-11-02 DIAGNOSIS — Z01818 Encounter for other preprocedural examination: Secondary | ICD-10-CM | POA: Diagnosis present

## 2022-11-02 DIAGNOSIS — Z01812 Encounter for preprocedural laboratory examination: Secondary | ICD-10-CM | POA: Insufficient documentation

## 2022-11-02 DIAGNOSIS — R9431 Abnormal electrocardiogram [ECG] [EKG]: Secondary | ICD-10-CM | POA: Insufficient documentation

## 2022-11-02 LAB — CBC WITH DIFFERENTIAL/PLATELET
Abs Immature Granulocytes: 0.03 10*3/uL (ref 0.00–0.07)
Basophils Absolute: 0.1 10*3/uL (ref 0.0–0.1)
Basophils Relative: 1 %
Eosinophils Absolute: 0.4 10*3/uL (ref 0.0–0.5)
Eosinophils Relative: 8 %
HCT: 41.8 % (ref 39.0–52.0)
Hemoglobin: 14.4 g/dL (ref 13.0–17.0)
Immature Granulocytes: 1 %
Lymphocytes Relative: 23 %
Lymphs Abs: 1.3 10*3/uL (ref 0.7–4.0)
MCH: 34.2 pg — ABNORMAL HIGH (ref 26.0–34.0)
MCHC: 34.4 g/dL (ref 30.0–36.0)
MCV: 99.3 fL (ref 80.0–100.0)
Monocytes Absolute: 0.3 10*3/uL (ref 0.1–1.0)
Monocytes Relative: 5 %
Neutro Abs: 3.5 10*3/uL (ref 1.7–7.7)
Neutrophils Relative %: 62 %
Platelets: 115 10*3/uL — ABNORMAL LOW (ref 150–400)
RBC: 4.21 MIL/uL — ABNORMAL LOW (ref 4.22–5.81)
RDW: 12.8 % (ref 11.5–15.5)
WBC: 5.6 10*3/uL (ref 4.0–10.5)
nRBC: 0 % (ref 0.0–0.2)

## 2022-11-02 LAB — BASIC METABOLIC PANEL
Anion gap: 10 (ref 5–15)
BUN: 21 mg/dL (ref 8–23)
CO2: 23 mmol/L (ref 22–32)
Calcium: 8.2 mg/dL — ABNORMAL LOW (ref 8.9–10.3)
Chloride: 104 mmol/L (ref 98–111)
Creatinine, Ser: 1.27 mg/dL — ABNORMAL HIGH (ref 0.61–1.24)
GFR, Estimated: >60 mL/min (ref >60)
Glucose, Bld: 161 mg/dL — ABNORMAL HIGH (ref 70–99)
Potassium: 3.7 mmol/L (ref 3.5–5.1)
Sodium: 137 mmol/L (ref 135–145)

## 2022-11-06 ENCOUNTER — Encounter (HOSPITAL_COMMUNITY): Payer: Self-pay | Admitting: General Surgery

## 2022-11-06 ENCOUNTER — Ambulatory Visit (HOSPITAL_BASED_OUTPATIENT_CLINIC_OR_DEPARTMENT_OTHER): Payer: 59 | Admitting: Certified Registered"

## 2022-11-06 ENCOUNTER — Ambulatory Visit (HOSPITAL_COMMUNITY)
Admission: RE | Admit: 2022-11-06 | Discharge: 2022-11-06 | Disposition: A | Discharge status: Home / Self Care | Payer: 59 | Attending: General Surgery | Admitting: General Surgery

## 2022-11-06 ENCOUNTER — Ambulatory Visit (HOSPITAL_COMMUNITY): Payer: 59 | Admitting: Certified Registered"

## 2022-11-06 ENCOUNTER — Encounter (HOSPITAL_COMMUNITY)
Admission: RE | Disposition: A | Discharge status: Home / Self Care | Payer: Self-pay | Source: Home / Self Care | Attending: General Surgery

## 2022-11-06 DIAGNOSIS — I4891 Unspecified atrial fibrillation: Secondary | ICD-10-CM | POA: Diagnosis not present

## 2022-11-06 DIAGNOSIS — Z9049 Acquired absence of other specified parts of digestive tract: Secondary | ICD-10-CM | POA: Insufficient documentation

## 2022-11-06 DIAGNOSIS — K4090 Unilateral inguinal hernia, without obstruction or gangrene, not specified as recurrent: Secondary | ICD-10-CM | POA: Diagnosis not present

## 2022-11-06 DIAGNOSIS — E039 Hypothyroidism, unspecified: Secondary | ICD-10-CM | POA: Diagnosis not present

## 2022-11-06 DIAGNOSIS — I10 Essential (primary) hypertension: Secondary | ICD-10-CM | POA: Diagnosis not present

## 2022-11-06 DIAGNOSIS — Z87891 Personal history of nicotine dependence: Secondary | ICD-10-CM | POA: Insufficient documentation

## 2022-11-06 HISTORY — PX: XI ROBOTIC ASSISTED INGUINAL HERNIA REPAIR WITH MESH: SHX6706

## 2022-11-06 LAB — GLUCOSE, CAPILLARY
Glucose-Capillary: 117 mg/dL — ABNORMAL HIGH (ref 70–99)
Glucose-Capillary: 155 mg/dL — ABNORMAL HIGH (ref 70–99)

## 2022-11-06 SURGERY — REPAIR, HERNIA, INGUINAL, ROBOT-ASSISTED, LAPAROSCOPIC, USING MESH
Anesthesia: General | Laterality: Right

## 2022-11-06 MED ORDER — FENTANYL CITRATE (PF) 250 MCG/5ML IJ SOLN
INTRAMUSCULAR | Status: DC | PRN
  Administered 2022-11-06 (×2): 50 ug via INTRAVENOUS

## 2022-11-06 MED ORDER — LIDOCAINE 2% (20 MG/ML) 5 ML SYRINGE
INTRAMUSCULAR | Status: DC | PRN
  Administered 2022-11-06: 100 mg via INTRAVENOUS

## 2022-11-06 MED ORDER — MIDAZOLAM HCL 2 MG/2ML IJ SOLN
INTRAMUSCULAR | Status: AC
  Filled 2022-11-06: qty 2

## 2022-11-06 MED ORDER — FENTANYL CITRATE (PF) 100 MCG/2ML IJ SOLN
INTRAMUSCULAR | Status: AC
  Filled 2022-11-06: qty 2

## 2022-11-06 MED ORDER — ONDANSETRON HCL 4 MG/2ML IJ SOLN
4.0000 mg | Freq: Once | INTRAMUSCULAR | Status: AC | PRN
  Administered 2022-11-06: 4 mg via INTRAVENOUS
  Filled 2022-11-06: qty 2

## 2022-11-06 MED ORDER — OXYCODONE HCL 5 MG PO TABS
5.0000 mg | ORAL_TABLET | Freq: Four times a day (QID) | ORAL | 0 refills | Status: AC | PRN
Start: 2022-11-06 — End: 2023-11-06

## 2022-11-06 MED ORDER — EPHEDRINE SULFATE-NACL 50-0.9 MG/10ML-% IV SOSY
PREFILLED_SYRINGE | INTRAVENOUS | Status: DC | PRN
Start: 2022-11-06 — End: 2022-11-06
  Administered 2022-11-06 (×2): 5 mg via INTRAVENOUS

## 2022-11-06 MED ORDER — DEXAMETHASONE SODIUM PHOSPHATE 4 MG/ML IJ SOLN
INTRAMUSCULAR | Status: DC | PRN
  Administered 2022-11-06: 5 mg via INTRAVENOUS

## 2022-11-06 MED ORDER — FENTANYL CITRATE PF 50 MCG/ML IJ SOSY: 25.0000 ug | PREFILLED_SYRINGE | INTRAMUSCULAR | Status: DC | PRN

## 2022-11-06 MED ORDER — KETOROLAC TROMETHAMINE 30 MG/ML IJ SOLN
INTRAMUSCULAR | Status: AC
  Filled 2022-11-06: qty 1

## 2022-11-06 MED ORDER — ROCURONIUM BROMIDE 10 MG/ML (PF) SYRINGE
PREFILLED_SYRINGE | INTRAVENOUS | Status: AC
  Filled 2022-11-06: qty 10

## 2022-11-06 MED ORDER — PROPOFOL 10 MG/ML IV BOLUS
INTRAVENOUS | Status: AC
  Filled 2022-11-06: qty 20

## 2022-11-06 MED ORDER — OXYCODONE HCL 5 MG PO TABS
5.0000 mg | ORAL_TABLET | Freq: Once | ORAL | Status: AC | PRN
  Administered 2022-11-06: 5 mg via ORAL
  Filled 2022-11-06: qty 1

## 2022-11-06 MED ORDER — ORAL CARE MOUTH RINSE: 15.0000 mL | Freq: Once | OROMUCOSAL | Status: DC

## 2022-11-06 MED ORDER — BUPIVACAINE HCL (PF) 0.5 % IJ SOLN
INTRAMUSCULAR | Status: AC
  Filled 2022-11-06: qty 30

## 2022-11-06 MED ORDER — OXYCODONE HCL 5 MG/5ML PO SOLN: 5.0000 mg | Freq: Once | ORAL | Status: AC | PRN

## 2022-11-06 MED ORDER — CHLORHEXIDINE GLUCONATE CLOTH 2 % EX PADS: 6.0000 | MEDICATED_PAD | Freq: Once | CUTANEOUS | Status: DC

## 2022-11-06 MED ORDER — CEFAZOLIN SODIUM-DEXTROSE 2-4 GM/100ML-% IV SOLN
2.0000 g | INTRAVENOUS | Status: AC
  Administered 2022-11-06: 2 g via INTRAVENOUS

## 2022-11-06 MED ORDER — CEFAZOLIN SODIUM-DEXTROSE 2-4 GM/100ML-% IV SOLN
INTRAVENOUS | Status: AC
  Filled 2022-11-06: qty 100

## 2022-11-06 MED ORDER — ONDANSETRON HCL 4 MG/2ML IJ SOLN
INTRAMUSCULAR | Status: DC | PRN
  Administered 2022-11-06: 4 mg via INTRAVENOUS

## 2022-11-06 MED ORDER — KETOROLAC TROMETHAMINE 30 MG/ML IJ SOLN
INTRAMUSCULAR | Status: DC | PRN
Start: 2022-11-06 — End: 2022-11-06
  Administered 2022-11-06: 30 mg via INTRAVENOUS

## 2022-11-06 MED ORDER — LACTATED RINGERS IV SOLN: INTRAVENOUS | Status: DC

## 2022-11-06 MED ORDER — ONDANSETRON HCL 4 MG/2ML IJ SOLN
INTRAMUSCULAR | Status: AC
  Filled 2022-11-06: qty 2

## 2022-11-06 MED ORDER — SUGAMMADEX SODIUM 200 MG/2ML IV SOLN
INTRAVENOUS | Status: DC | PRN
  Administered 2022-11-06: 200 mg via INTRAVENOUS

## 2022-11-06 MED ORDER — EPHEDRINE 5 MG/ML INJ
INTRAVENOUS | Status: AC
  Filled 2022-11-06: qty 5

## 2022-11-06 MED ORDER — CHLORHEXIDINE GLUCONATE 0.12 % MT SOLN: 15.0000 mL | Freq: Once | OROMUCOSAL | Status: DC

## 2022-11-06 MED ORDER — MIDAZOLAM HCL 2 MG/2ML IJ SOLN
INTRAMUSCULAR | Status: DC | PRN
  Administered 2022-11-06: 1 mg via INTRAVENOUS

## 2022-11-06 MED ORDER — ROCURONIUM BROMIDE 10 MG/ML (PF) SYRINGE
PREFILLED_SYRINGE | INTRAVENOUS | Status: DC | PRN
  Administered 2022-11-06: 10 mg via INTRAVENOUS
  Administered 2022-11-06: 50 mg via INTRAVENOUS
  Administered 2022-11-06: 10 mg via INTRAVENOUS

## 2022-11-06 MED ORDER — STERILE WATER FOR IRRIGATION IR SOLN
Status: DC | PRN
  Administered 2022-11-06: 500 mL

## 2022-11-06 MED ORDER — LIDOCAINE HCL (PF) 2 % IJ SOLN
INTRAMUSCULAR | Status: AC
  Filled 2022-11-06: qty 5

## 2022-11-06 MED ORDER — BUPIVACAINE HCL (PF) 0.5 % IJ SOLN
INTRAMUSCULAR | Status: DC | PRN
  Administered 2022-11-06: 30 mL

## 2022-11-06 MED ORDER — PROPOFOL 10 MG/ML IV BOLUS
INTRAVENOUS | Status: DC | PRN
  Administered 2022-11-06: 150 mg via INTRAVENOUS

## 2022-11-06 SURGICAL SUPPLY — 52 items
ADH SKN CLS APL DERMABOND .7 (GAUZE/BANDAGES/DRESSINGS) ×1
APL PRP STRL LF DISP 70% ISPRP (MISCELLANEOUS) ×1
CHLORAPREP W/TINT 26 (MISCELLANEOUS) ×1 IMPLANT
COVER LIGHT HANDLE STERIS (MISCELLANEOUS) ×1 IMPLANT
COVER MAYO STAND STRL (DRAPES) ×1 IMPLANT
COVER MAYO STAND XLG (MISCELLANEOUS) ×1 IMPLANT
COVER TIP SHEARS 8 DVNC (MISCELLANEOUS) ×1 IMPLANT
DEFOGGER SCOPE WARMER CLEARIFY (MISCELLANEOUS) IMPLANT
DERMABOND ADVANCED .7 DNX12 (GAUZE/BANDAGES/DRESSINGS) ×1 IMPLANT
DRAPE ARM DVNC X/XI (DISPOSABLE) ×3 IMPLANT
DRAPE COLUMN DVNC XI (DISPOSABLE) ×1 IMPLANT
DRAPE HALF SHEET 40X57 (DRAPES) ×1 IMPLANT
DRIVER NDL MEGA SUTCUT DVNCXI (INSTRUMENTS) ×1 IMPLANT
DRIVER NDLE MEGA SUTCUT DVNCXI (INSTRUMENTS) ×1
ELECT REM PT RETURN 9FT ADLT (ELECTROSURGICAL) ×1
ELECTRODE REM PT RTRN 9FT ADLT (ELECTROSURGICAL) ×1 IMPLANT
FORCEPS BPLR R/ABLATION 8 DVNC (INSTRUMENTS) ×1 IMPLANT
GAUZE SPONGE 4X4 12PLY STRL (GAUZE/BANDAGES/DRESSINGS) ×1 IMPLANT
GLOVE BIO SURGEON STRL SZ7 (GLOVE) IMPLANT
GLOVE BIOGEL PI IND STRL 7.0 (GLOVE) ×4 IMPLANT
GLOVE SURG SS PI 7.5 STRL IVOR (GLOVE) ×2 IMPLANT
GOWN STRL REUS W/TWL LRG LVL3 (GOWN DISPOSABLE) ×2 IMPLANT
GRASPER SUT TROCAR 14GX15 (MISCELLANEOUS) IMPLANT
IRRIGATOR SUCT 8 DISP DVNC XI (IRRIGATION / IRRIGATOR) IMPLANT
IV NS IRRIG 3000ML ARTHROMATIC (IV SOLUTION) IMPLANT
KIT PINK PAD W/HEAD ARE REST (MISCELLANEOUS) ×1
KIT PINK PAD W/HEAD ARM REST (MISCELLANEOUS) ×1 IMPLANT
KIT TURNOVER KIT A (KITS) ×1 IMPLANT
MESH 3DMAX MID 5X7 RT XLRG (Mesh General) IMPLANT
NDL HYPO 21X1.5 SAFETY (NEEDLE) ×1 IMPLANT
NDL INSUFFLATION 14GA 120MM (NEEDLE) ×1 IMPLANT
NEEDLE HYPO 21X1.5 SAFETY (NEEDLE) ×1
NEEDLE INSUFFLATION 14GA 120MM (NEEDLE) ×1
OBTURATOR OPTICAL STND 8 DVNC (TROCAR) ×1
OBTURATOR OPTICALSTD 8 DVNC (TROCAR) ×1 IMPLANT
PACK LAP CHOLE LZT030E (CUSTOM PROCEDURE TRAY) ×1 IMPLANT
PENCIL HANDSWITCHING (ELECTRODE) ×1 IMPLANT
POSITIONER HEAD 8X9X4 ADT (SOFTGOODS) ×1 IMPLANT
SCISSORS MNPLR CVD DVNC XI (INSTRUMENTS) ×1 IMPLANT
SEAL UNIV 5-12 XI (MISCELLANEOUS) ×3 IMPLANT
SET BASIN LINEN APH (SET/KITS/TRAYS/PACK) ×1 IMPLANT
SET TUBE DA VINCI INSUFFLATOR (TUBING) IMPLANT
SOL PREP POV-IOD 4OZ 10% (MISCELLANEOUS) ×1 IMPLANT
SUT MNCRL AB 4-0 PS2 18 (SUTURE) ×2 IMPLANT
SUT V-LOC 90 ABS 3-0 VLT V-20 (SUTURE) ×2 IMPLANT
SUT VIC AB 2-0 SH 27 (SUTURE) ×1
SUT VIC AB 2-0 SH 27X BRD (SUTURE) ×1 IMPLANT
SYR 30ML LL (SYRINGE) ×1 IMPLANT
TAPE TRANSPORE STRL 2 31045 (GAUZE/BANDAGES/DRESSINGS) ×1 IMPLANT
TRAY FOL W/BAG SLVR 16FR STRL (SET/KITS/TRAYS/PACK) ×1 IMPLANT
TRAY FOLEY W/BAG SLVR 16FR LF (SET/KITS/TRAYS/PACK) ×1
WATER STERILE IRR 500ML POUR (IV SOLUTION) ×1 IMPLANT

## 2022-11-06 NOTE — Progress Notes (Signed)
Called Dr.Jenkins to notify that pt was unable to urinate after drinking 5 cups of water. Bladder scanned patient and states "0ml" on bladder scanner. Dr.Jenkins states "okay to d/c".  Pt discharged and made aware that if unable to urinate in 8 hours or feels to need to urinate and unable to, to come to ER.

## 2022-11-06 NOTE — Anesthesia Preprocedure Evaluation (Signed)
Anesthesia Evaluation  Patient identified by MRN, date of birth, ID band Patient awake    Reviewed: Allergy & Precautions, H&P , NPO status , Patient's Chart, lab work & pertinent test results, reviewed documented beta blocker date and time   Airway Mallampati: II  TM Distance: >3 FB Neck ROM: full    Dental no notable dental hx.    Pulmonary neg pulmonary ROS, former smoker   Pulmonary exam normal breath sounds clear to auscultation       Cardiovascular Exercise Tolerance: Good hypertension, + dysrhythmias Atrial Fibrillation  Rhythm:regular Rate:Normal     Neuro/Psych  PSYCHIATRIC DISORDERS Anxiety Depression    negative neurological ROS  negative psych ROS   GI/Hepatic negative GI ROS, Neg liver ROS,,,  Endo/Other  negative endocrine ROSHypothyroidism    Renal/GU negative Renal ROS  negative genitourinary   Musculoskeletal   Abdominal   Peds  Hematology negative hematology ROS (+)   Anesthesia Other Findings   Reproductive/Obstetrics negative OB ROS                             Anesthesia Physical Anesthesia Plan  ASA: 3  Anesthesia Plan: General and General ETT   Post-op Pain Management:    Induction:   PONV Risk Score and Plan: Ondansetron  Airway Management Planned:   Additional Equipment:   Intra-op Plan:   Post-operative Plan:   Informed Consent: I have reviewed the patients History and Physical, chart, labs and discussed the procedure including the risks, benefits and alternatives for the proposed anesthesia with the patient or authorized representative who has indicated his/her understanding and acceptance.     Dental Advisory Given  Plan Discussed with: CRNA  Anesthesia Plan Comments:        Anesthesia Quick Evaluation

## 2022-11-06 NOTE — Op Note (Signed)
Patient:  Justin [name]  DOB:  [birth year]  MRN:  [id number]   Preop Diagnosis: Right inguinal hernia  Postop Diagnosis: Same  Procedure: Robotic assisted laparoscopic right inguinal herniorrhaphy with mesh  Surgeon: Franky Macho, MD  Anes: General Endotracheal  Indications: Patient is a 72 year old white male who presents with a symptomatic right inguinal hernia.  The risks and benefits of the procedure including bleeding, infection, mesh use, and the possibility of recurrence of the hernia were fully explained to the patient, who gave informed consent.  Procedure note: The patient was placed in the supine position.  After induction of general endotracheal anesthesia, the abdomen and perineum were prepped and draped using usual sterile technique with ChloraPrep.  Surgical site confirmation was performed.  An incision was made in the left upper quadrant at Palmer's point.  A Veress needle was introduced into the abdominal cavity and confirmation of placement was done using the saline drop test.  The abdomen was then insufflated to 15 mmHg pressure.  An 8 mm trocar was introduced into the abdominal cavity under direct visualization without difficulty.  Additional 8 mm trocars were placed in the upper midline and right upper quadrant regions.  The robot was then docked and targeted.  The patient was placed in Trendelenburg position.  The left lower quadrant was inspected and no inguinal hernia was noted.  The patient was noted on the right side to probably have a lipoma in the region of the inguinal canal.  A peritoneal flap was then formed lateral to medial.  The dissection was taken down to Cooper's ligament.  The patient was noted to have lobulated fat tissue going into the inguinal ring.  This was reduced.  Care was taken to avoid the cord structures.  A posterior flap was then formed greater than 6 cm from the inguinal ring.  An extra-large Bard 3D max mesh was then inserted and secured to  Cooper's ligament using a 3-0 Vicryl suture.  This was likewise done anteriorly to the abdominal wall.  The peritoneal flap was closed using a 3-0 V-Loc running suture.  Air was evacuated from the abdominal cavity and adequate approximation of the peritoneum to the mesh was noted.  The robot was undocked and all air was evacuated from the abdominal cavity prior to removal of the trocars.  All wounds were irrigated with normal saline.  All wounds were injected with 0.5% Sensorcaine.  All incisions were closed using a 4-0 Monocryl subcuticular suture.  Dermabond was applied.  All tape and needle counts were correct at the end of the procedure.  Patient was extubated in the operating room and transferred to PACU in stable condition.  Complications: None  EBL: Minimal  Specimen: None

## 2022-11-06 NOTE — Transfer of Care (Signed)
Immediate Anesthesia Transfer of Care Note  Patient: Justin [name]  Procedure(s) Performed: XI ROBOTIC ASSISTED INGUINAL HERNIA REPAIR WITH MESH (Right)  Patient Location: PACU  Anesthesia Type:General  Level of Consciousness: awake, alert , and oriented  Airway & Oxygen Therapy: Patient Spontanous Breathing and Patient connected to face mask oxygen  Post-op Assessment: Report given to RN and Post -op Vital signs reviewed and stable  Post vital signs: Reviewed and stable  Last Vitals:  Vitals Value Taken Time  BP    Temp    Pulse 65 11/06/22 1124  Resp 18 11/06/22 1124  SpO2 100 % 11/06/22 1124  Vitals shown include unfiled device data.  Last Pain:  Vitals:   11/06/22 0827  TempSrc: Oral  PainSc: 5       Patients Stated Pain Goal: 5 (11/06/22 0827)  Complications: No notable events documented.

## 2022-11-06 NOTE — Interval H&P Note (Signed)
History and Physical Interval Note:  11/06/2022 8:33 AM  Justin [name]  has presented today for surgery, with the diagnosis of Right inguinal hernia.  The various methods of treatment have been discussed with the patient and family. After consideration of risks, benefits and other options for treatment, the patient has consented to  Procedure(s): XI ROBOTIC ASSISTED INGUINAL HERNIA REPAIR WITH MESH (Right) as a surgical intervention.  The patient's history has been reviewed, patient examined, no change in status, stable for surgery.  I have reviewed the patient's chart and labs.  Questions were answered to the patient's satisfaction.     Franky Macho

## 2022-11-06 NOTE — Anesthesia Postprocedure Evaluation (Signed)
Anesthesia Post Note  Patient: Justin [name]  Procedure(s) Performed: XI ROBOTIC ASSISTED INGUINAL HERNIA REPAIR WITH MESH (Right)  Patient location during evaluation: Phase II Anesthesia Type: General Level of consciousness: awake Pain management: pain level controlled Vital Signs Assessment: post-procedure vital signs reviewed and stable Respiratory status: spontaneous breathing and respiratory function stable Cardiovascular status: blood pressure returned to baseline and stable Postop Assessment: no headache and no apparent nausea or vomiting Anesthetic complications: no Comments: Late entry   No notable events documented.   Last Vitals:  Vitals:   11/06/22 1200 11/06/22 1214  BP: 111/69 121/76  Pulse: (!) 57 (!) 52  Resp: 13 16  Temp: (!) 36.4 C 36.5 C  SpO2: 98% 97%    Last Pain:  Vitals:   11/06/22 1214  TempSrc: Axillary  PainSc: 9                  Windell Norfolk

## 2022-11-06 NOTE — Progress Notes (Signed)
Pt abdomen taunt when assessing pt for d/c. Dr.Jenkins made aware. Dr.jenkins coming to bedside.

## 2022-11-06 NOTE — Anesthesia Procedure Notes (Signed)
Procedure Name: Intubation Date/Time: 11/06/2022 9:27 AM  Performed by: Julian Reil, CRNAPre-anesthesia Checklist: Patient identified, Emergency Drugs available, Suction available and Patient being monitored Patient Re-evaluated:Patient Re-evaluated prior to induction Oxygen Delivery Method: Circle system utilized Preoxygenation: Pre-oxygenation with 100% oxygen Induction Type: IV induction Ventilation: Mask ventilation without difficulty Laryngoscope Size: Miller and 3 Grade View: Grade I Tube type: Oral Tube size: 7.5 mm Number of attempts: 1 Airway Equipment and Method: Stylet Placement Confirmation: ETT inserted through vocal cords under direct vision, positive ETCO2 and breath sounds checked- equal and bilateral Secured at: 23 cm Tube secured with: Tape Dental Injury: Teeth and Oropharynx as per pre-operative assessment

## 2022-11-13 ENCOUNTER — Encounter (HOSPITAL_COMMUNITY): Payer: Self-pay | Admitting: General Surgery

## 2022-11-16 ENCOUNTER — Encounter: Payer: Self-pay | Admitting: General Surgery

## 2022-11-16 ENCOUNTER — Ambulatory Visit (INDEPENDENT_AMBULATORY_CARE_PROVIDER_SITE_OTHER): Payer: 59 | Admitting: General Surgery

## 2022-11-16 VITALS — BP 116/71 | HR 65 | Temp 98.5°F | Resp 14 | Ht 70.0 in | Wt 255.0 lb

## 2022-11-16 DIAGNOSIS — Z09 Encounter for follow-up examination after completed treatment for conditions other than malignant neoplasm: Secondary | ICD-10-CM

## 2022-11-16 MED ORDER — AZITHROMYCIN 250 MG PO TABS
ORAL_TABLET | ORAL | 0 refills | Status: AC
Start: 2022-11-16 — End: 2022-11-21

## 2022-11-16 NOTE — Progress Notes (Signed)
Subjective:     Justin [name]  Patient here for postoperative visit, status post robotic assisted laparoscopic right inguinal herniorrhaphy with mesh.  He has been doing well.  He has no specific complaints.  Occasional tingling is noted in the right testicle but this goes away quickly.  He did bruise along the lower abdomen but this is resolving. Objective:    BP 116/71   Pulse 65   Temp 98.5 F (36.9 C) (Oral)   Resp 14   Ht 5\' 10"  (1.778 m)   Wt 255 lb (115.7 kg)   SpO2 93%   BMI 36.59 kg/m   General:  alert, cooperative, and no distress  Abdomen is soft, incisions healing well.  Resolving lower abdominal wall ecchymosis.  No hernias present.  No scrotal swelling.     Assessment:    Doing well postoperatively.    Plan:   May increase activity as able.  Follow-up here as needed.  I did give him a prescription for a Z-Pak due to sinusitis.

## 2022-11-17 ENCOUNTER — Other Ambulatory Visit: Payer: Self-pay | Admitting: Internal Medicine

## 2022-12-01 DIAGNOSIS — H6693 Otitis media, unspecified, bilateral: Secondary | ICD-10-CM | POA: Diagnosis not present

## 2022-12-01 DIAGNOSIS — J349 Unspecified disorder of nose and nasal sinuses: Secondary | ICD-10-CM | POA: Diagnosis not present

## 2022-12-09 ENCOUNTER — Other Ambulatory Visit: Payer: Self-pay | Admitting: Internal Medicine

## 2022-12-13 ENCOUNTER — Other Ambulatory Visit: Payer: Self-pay | Admitting: Internal Medicine

## 2022-12-21 DIAGNOSIS — I10 Essential (primary) hypertension: Secondary | ICD-10-CM | POA: Diagnosis not present

## 2022-12-21 DIAGNOSIS — J302 Other seasonal allergic rhinitis: Secondary | ICD-10-CM | POA: Diagnosis not present

## 2022-12-21 DIAGNOSIS — E1169 Type 2 diabetes mellitus with other specified complication: Secondary | ICD-10-CM | POA: Diagnosis not present

## 2022-12-21 DIAGNOSIS — J449 Chronic obstructive pulmonary disease, unspecified: Secondary | ICD-10-CM | POA: Diagnosis not present

## 2022-12-21 DIAGNOSIS — Z299 Encounter for prophylactic measures, unspecified: Secondary | ICD-10-CM | POA: Diagnosis not present

## 2022-12-21 DIAGNOSIS — E039 Hypothyroidism, unspecified: Secondary | ICD-10-CM | POA: Diagnosis not present

## 2022-12-25 DIAGNOSIS — J45901 Unspecified asthma with (acute) exacerbation: Secondary | ICD-10-CM | POA: Diagnosis not present

## 2023-01-01 DIAGNOSIS — E1169 Type 2 diabetes mellitus with other specified complication: Secondary | ICD-10-CM | POA: Diagnosis not present

## 2023-01-01 DIAGNOSIS — J441 Chronic obstructive pulmonary disease with (acute) exacerbation: Secondary | ICD-10-CM | POA: Diagnosis not present

## 2023-01-01 DIAGNOSIS — Z299 Encounter for prophylactic measures, unspecified: Secondary | ICD-10-CM | POA: Diagnosis not present

## 2023-01-01 DIAGNOSIS — J449 Chronic obstructive pulmonary disease, unspecified: Secondary | ICD-10-CM | POA: Diagnosis not present

## 2023-01-09 ENCOUNTER — Other Ambulatory Visit: Payer: Self-pay | Admitting: Internal Medicine

## 2023-01-10 DIAGNOSIS — J040 Acute laryngitis: Secondary | ICD-10-CM | POA: Diagnosis not present

## 2023-01-10 DIAGNOSIS — Z79899 Other long term (current) drug therapy: Secondary | ICD-10-CM | POA: Diagnosis not present

## 2023-01-10 DIAGNOSIS — Z888 Allergy status to other drugs, medicaments and biological substances status: Secondary | ICD-10-CM | POA: Diagnosis not present

## 2023-01-10 DIAGNOSIS — R059 Cough, unspecified: Secondary | ICD-10-CM | POA: Diagnosis not present

## 2023-01-10 DIAGNOSIS — E119 Type 2 diabetes mellitus without complications: Secondary | ICD-10-CM | POA: Diagnosis not present

## 2023-01-10 DIAGNOSIS — Z87891 Personal history of nicotine dependence: Secondary | ICD-10-CM | POA: Diagnosis not present

## 2023-01-10 DIAGNOSIS — J329 Chronic sinusitis, unspecified: Secondary | ICD-10-CM | POA: Diagnosis not present

## 2023-01-12 DIAGNOSIS — I10 Essential (primary) hypertension: Secondary | ICD-10-CM | POA: Diagnosis not present

## 2023-01-12 DIAGNOSIS — J441 Chronic obstructive pulmonary disease with (acute) exacerbation: Secondary | ICD-10-CM | POA: Diagnosis not present

## 2023-01-12 DIAGNOSIS — E1169 Type 2 diabetes mellitus with other specified complication: Secondary | ICD-10-CM | POA: Diagnosis not present

## 2023-01-12 DIAGNOSIS — Z299 Encounter for prophylactic measures, unspecified: Secondary | ICD-10-CM | POA: Diagnosis not present

## 2023-01-12 DIAGNOSIS — I700 Atherosclerosis of aorta: Secondary | ICD-10-CM | POA: Diagnosis not present

## 2023-01-23 DIAGNOSIS — R918 Other nonspecific abnormal finding of lung field: Secondary | ICD-10-CM | POA: Diagnosis not present

## 2023-01-23 DIAGNOSIS — R051 Acute cough: Secondary | ICD-10-CM | POA: Diagnosis not present

## 2023-01-23 DIAGNOSIS — I700 Atherosclerosis of aorta: Secondary | ICD-10-CM | POA: Diagnosis not present

## 2023-02-02 DIAGNOSIS — E1169 Type 2 diabetes mellitus with other specified complication: Secondary | ICD-10-CM | POA: Diagnosis not present

## 2023-02-02 DIAGNOSIS — I10 Essential (primary) hypertension: Secondary | ICD-10-CM | POA: Diagnosis not present

## 2023-02-02 DIAGNOSIS — Z299 Encounter for prophylactic measures, unspecified: Secondary | ICD-10-CM | POA: Diagnosis not present

## 2023-02-02 DIAGNOSIS — J449 Chronic obstructive pulmonary disease, unspecified: Secondary | ICD-10-CM | POA: Diagnosis not present

## 2023-02-02 DIAGNOSIS — R52 Pain, unspecified: Secondary | ICD-10-CM | POA: Diagnosis not present

## 2023-02-02 DIAGNOSIS — I700 Atherosclerosis of aorta: Secondary | ICD-10-CM | POA: Diagnosis not present

## 2023-02-06 ENCOUNTER — Emergency Department (HOSPITAL_COMMUNITY)
Admission: EM | Admit: 2023-02-06 | Discharge: 2023-02-06 | Disposition: A | Discharge status: Home / Self Care | Payer: 59 | Attending: Emergency Medicine | Admitting: Emergency Medicine

## 2023-02-06 ENCOUNTER — Emergency Department (HOSPITAL_COMMUNITY): Payer: 59

## 2023-02-06 ENCOUNTER — Other Ambulatory Visit: Payer: Self-pay

## 2023-02-06 ENCOUNTER — Encounter (HOSPITAL_COMMUNITY): Payer: Self-pay

## 2023-02-06 DIAGNOSIS — R0602 Shortness of breath: Secondary | ICD-10-CM | POA: Diagnosis not present

## 2023-02-06 DIAGNOSIS — Z79899 Other long term (current) drug therapy: Secondary | ICD-10-CM | POA: Diagnosis not present

## 2023-02-06 DIAGNOSIS — R059 Cough, unspecified: Secondary | ICD-10-CM | POA: Diagnosis not present

## 2023-02-06 DIAGNOSIS — E039 Hypothyroidism, unspecified: Secondary | ICD-10-CM | POA: Insufficient documentation

## 2023-02-06 DIAGNOSIS — I10 Essential (primary) hypertension: Secondary | ICD-10-CM | POA: Insufficient documentation

## 2023-02-06 DIAGNOSIS — R9389 Abnormal findings on diagnostic imaging of other specified body structures: Secondary | ICD-10-CM | POA: Diagnosis not present

## 2023-02-06 LAB — CBC WITH DIFFERENTIAL/PLATELET
Abs Immature Granulocytes: 0.03 10*3/uL (ref 0.00–0.07)
Basophils Absolute: 0 10*3/uL (ref 0.0–0.1)
Basophils Relative: 1 %
Eosinophils Absolute: 0.4 10*3/uL (ref 0.0–0.5)
Eosinophils Relative: 6 %
HCT: 42.7 % (ref 39.0–52.0)
Hemoglobin: 14.7 g/dL (ref 13.0–17.0)
Immature Granulocytes: 1 %
Lymphocytes Relative: 18 %
Lymphs Abs: 1.1 10*3/uL (ref 0.7–4.0)
MCH: 33.9 pg (ref 26.0–34.0)
MCHC: 34.4 g/dL (ref 30.0–36.0)
MCV: 98.6 fL (ref 80.0–100.0)
Monocytes Absolute: 0.6 10*3/uL (ref 0.1–1.0)
Monocytes Relative: 9 %
Neutro Abs: 4.2 10*3/uL (ref 1.7–7.7)
Neutrophils Relative %: 65 %
Platelets: 124 10*3/uL — ABNORMAL LOW (ref 150–400)
RBC: 4.33 MIL/uL (ref 4.22–5.81)
RDW: 12.4 % (ref 11.5–15.5)
WBC: 6.3 10*3/uL (ref 4.0–10.5)
nRBC: 0 % (ref 0.0–0.2)

## 2023-02-06 LAB — COMPREHENSIVE METABOLIC PANEL
ALT: 33 U/L (ref 0–44)
AST: 27 U/L (ref 15–41)
Albumin: 3.6 g/dL (ref 3.5–5.0)
Alkaline Phosphatase: 72 U/L (ref 38–126)
Anion gap: 7 (ref 5–15)
BUN: 17 mg/dL (ref 8–23)
CO2: 26 mmol/L (ref 22–32)
Calcium: 8.5 mg/dL — ABNORMAL LOW (ref 8.9–10.3)
Chloride: 102 mmol/L (ref 98–111)
Creatinine, Ser: 1.18 mg/dL (ref 0.61–1.24)
GFR, Estimated: >60 mL/min (ref >60)
Glucose, Bld: 119 mg/dL — ABNORMAL HIGH (ref 70–99)
Potassium: 3.9 mmol/L (ref 3.5–5.1)
Sodium: 135 mmol/L (ref 135–145)
Total Bilirubin: 1.9 mg/dL — ABNORMAL HIGH (ref 0.0–1.2)
Total Protein: 6.9 g/dL (ref 6.5–8.1)

## 2023-02-06 LAB — TSH: TSH: 1.239 u[IU]/mL (ref 0.350–4.500)

## 2023-02-06 LAB — LACTIC ACID, PLASMA: Lactic Acid, Venous: 1.6 mmol/L (ref 0.5–1.9)

## 2023-02-06 LAB — BRAIN NATRIURETIC PEPTIDE: B Natriuretic Peptide: 28 pg/mL (ref 0.0–100.0)

## 2023-02-06 MED ORDER — BENZONATATE 100 MG PO CAPS: 100.0000 mg | ORAL_CAPSULE | Freq: Three times a day (TID) | ORAL | 0 refills | Status: AC

## 2023-02-06 NOTE — ED Provider Notes (Addendum)
 [place] EMERGENCY DEPARTMENT AT [facility] Provider Note   CSN: [id number] Arrival date & time: 02/06/23  1028     History  Chief Complaint  Patient presents with   Shortness of Breath   Cough    Justin [name] is a 73 y.o. male with history of A-fib, obesity, hypothyroidism, hypertension, presents with concern for ongoing shortness of breath for the past 5 months.  States this is worse first thing in the morning.  No acute worsening today.  Denies any chest pain, fevers, chills.  He was started on Levaquin 4 days ago for possible pneumonia, but states that this is not improved his symptoms.  Denies any lower extremity swelling or pain.   Shortness of Breath Associated symptoms: cough   Cough Associated symptoms: shortness of breath        Home Medications Prior to Admission medications   Medication Sig Start Date End Date Taking? Authorizing Provider  benzonatate  (TESSALON ) 100 MG capsule Take 1 capsule (100 mg total) by mouth every 8 (eight) hours. 02/06/23  Yes Veta Palma, PA-C  albuterol  (VENTOLIN  HFA) 108 (90 Base) MCG/ACT inhaler Inhale 2 puffs into the lungs 4 (four) times daily. 11/05/19   [provider]  ALPRAZolam  (XANAX ) 1 MG tablet Take 1 mg by mouth 3 (three) times daily.     [provider]  b complex vitamins capsule Take 1 capsule by mouth daily.    [provider]  Bioflavonoid Products (VITAMIN C) CHEW Chew 1 tablet by mouth daily.    [provider]  Calcium-Magnesium-Zinc (CAL-MAG-ZINC PO) Take 1 tablet by mouth daily.    [provider]  Coenzyme Q10 (COQ-10) 200 MG CAPS Take 400 mg by mouth daily.    [provider]  CRANBERRY PO Take 2 tablets by mouth daily.    [provider]  furosemide  (LASIX ) 40 MG tablet Take 40 mg by mouth daily. 07/11/18   [provider]  Garlic 1000 MG CAPS Take 1,000 mg by mouth daily.    [provider]  levothyroxine   (SYNTHROID ) 137 MCG tablet Take 137 mcg by mouth daily. 06/21/21   [provider]  metFORMIN (GLUCOPHAGE) 500 MG tablet Take 500 mg by mouth daily as needed (high blood sugar).    [provider]  metoprolol  succinate (TOPROL -XL) 25 MG 24 hr tablet Take 1 tablet (25 mg total) by mouth daily. Patient taking differently: Take 12.5 mg by mouth daily. 06/30/21   Ursuy, Renee Lynn, PA-C  nitroGLYCERIN  (NITROSTAT ) 0.4 MG SL tablet Place 1 tablet (0.4 mg total) under the tongue every 5 (five) minutes as needed for chest pain. 08/19/15   Waddell Danelle ORN, MD  Omega-3 Fatty Acids (FISH OIL) 1200 MG CAPS Take 1,200 mg by mouth daily.    [provider]  oxyCODONE  (ROXICODONE ) 5 MG immediate release tablet Take 1 tablet (5 mg total) by mouth every 6 (six) hours as needed. 11/06/22 11/06/23  Mavis Anes, MD  Oxymetazoline HCl [facility] NASAL SPRAY FULL FORCE NA) Place 1 spray into the nose daily as needed (congestion).    [provider]  PARoxetine  (PAXIL ) 20 MG tablet Take 10 mg by mouth daily.     [provider]  Phenylephrine -Acetaminophen  (TYLENOL  SINUS+HEADACHE) 5-325 MG TABS Take 2 tablets by mouth daily as needed (sinuses / headaches).    [provider]  Phenylephrine -DM-GG-APAP 5-10-200-325 MG/10ML LIQD Take 10 mLs by mouth daily as needed (congestion).    [provider]  potassium chloride  (KLOR-CON  M) 10 MEQ tablet TAKE 1 TABLET BY MOUTH EVERY DAY 12/12/22   Waddell Danelle ORN, MD  Red Yeast Rice 600 MG CAPS Take 600 mg by mouth daily.    [provider]  silodosin  (RAPAFLO ) 8 MG CAPS capsule Take 1 capsule (8 mg total) by mouth at bedtime. 03/27/22   McKenzie, Belvie CROME, MD  sodium chloride  (OCEAN) 0.65 % SOLN nasal spray Place 1 spray into both nostrils as needed for congestion.    [provider]  SYMBICORT  160-4.5 MCG/ACT inhaler Inhale 2 puffs into the lungs 2 (two) times daily. 11/05/19   [provider]  vitamin  E 180 MG (400 UNITS) capsule Take 400 Units by mouth daily.    [provider]      Allergies    Gabapentin, Halcion [triazolam], Lisinopril, and Pravastatin    Review of Systems   Review of Systems  Respiratory:  Positive for cough and shortness of breath.     Physical Exam Updated Vital Signs BP 122/67 (BP Location: Right Arm)   Pulse 76   Temp 99.3 F (37.4 C) (Oral)   Resp 20   Ht 5' 10 (1.778 m)   Wt 115.7 kg   SpO2 98%   BMI 36.59 kg/m  Physical Exam Vitals and nursing note reviewed.  Constitutional:      General: He is not in acute distress.    Appearance: He is well-developed.  HENT:     Head: Normocephalic and atraumatic.  Eyes:     Conjunctiva/sclera: Conjunctivae normal.  Cardiovascular:     Rate and Rhythm: Normal rate and regular rhythm.     Heart sounds: No murmur heard. Pulmonary:     Effort: Pulmonary effort is normal. No respiratory distress.     Breath sounds: Normal breath sounds.     Comments: Breathing comfortably on room air, talks in full sentences Abdominal:     Palpations: Abdomen is soft.     Tenderness: There is no abdominal tenderness.  Musculoskeletal:        General: No swelling.     Cervical back: Neck supple.     Right lower leg: No edema.     Left lower leg: No edema.  Skin:    General: Skin is warm and dry.     Capillary Refill: Capillary refill takes less than 2 seconds.  Neurological:     Mental Status: He is alert.  Psychiatric:        Mood and Affect: Mood normal.     ED Results / Procedures / Treatments   Labs (all labs ordered are listed, but only abnormal results are displayed) Labs Reviewed  COMPREHENSIVE METABOLIC PANEL - Abnormal; Notable for the following components:      Result Value   Glucose, Bld 119 (*)    Calcium 8.5 (*)    Total Bilirubin 1.9 (*)    All other components within normal limits  CBC WITH DIFFERENTIAL/PLATELET - Abnormal; Notable for the following components:   Platelets 124  (*)    All other components within normal limits  LACTIC ACID, PLASMA  BRAIN NATRIURETIC PEPTIDE  TSH    EKG None  Radiology DG Chest 2 View Result Date: 02/06/2023 CLINICAL DATA:  Shortness of breath. EXAM: CHEST - 2 VIEW COMPARISON:  01/10/2023. FINDINGS: Bilateral lung fields are clear. Bilateral costophrenic angles are clear. Note is made of elevated right hemidiaphragm. Normal cardio-mediastinal silhouette. No acute osseous abnormalities. The soft tissues are within normal limits.  IMPRESSION: *No active cardiopulmonary disease. Electronically Signed   By: Ree Molt M.D.   On: 02/06/2023 11:02    Procedures Procedures    Medications Ordered in ED Medications - No data to display  ED Course/ Medical Decision Making/ A&P                                 Medical Decision Making Amount and/or Complexity of Data Reviewed Labs: ordered. Radiology: ordered.  Risk Prescription drug management.     Differential diagnosis includes but is not limited to pneumonia, CHF, COPD, sleep apnea, viral URI  ED Course:  Patient well-appearing, 98% on room air, talking full sentences.  Vital signs stable.  His shortness of breath has been ongoing for the past 5 months without any acute worsening.  Denies any chest pain.  Denies any lower extremity swelling, I do not appreciate lower extremity edema on exam, BNP within normal limits, previous echo from 2020 shows no signs of heart failure, no concern for heart failure exacerbation at this time.  TSH within normal limits.  Lactic acid within normal limits.  Chest x-ray without any acute abnormalities.  No concern for ACS at this time given the chronicity of symptoms and no chest pain, no acute worsening of shortness of breath.  No concern for any emergent pathology at this time. Addendum 3:56 PM patient requests something to help with his cough and congestion at home.  I recommended using Flonase at home as needed for congestion.  He is  already using saline nasal sprays.  We will try Tessalon  Perles at home for cough as he states Mucinex does not help much.   Impression: Chronic shortness of breath  Disposition:  The patient was discharged home with instructions to follow-up with PCP within the next 2 weeks regarding his symptoms.  I also provided contact information for the pulmonology office for him to contact and schedule an appointment as soon as possible for further evaluation. He was instructed to follow up at his cardiology appointment on Janurary 20th.  Return precautions given.    External records from outside source obtained and reviewed including echocardiogram from 07/20/2018 revealing left ventricle EF at 60 to 65%.              Final Clinical Impression(s) / ED Diagnoses Final diagnoses:  Shortness of breath    Rx / DC Orders ED Discharge Orders          Ordered    benzonatate  (TESSALON ) 100 MG capsule  Every 8 hours        02/06/23 1551              Veta Palma, PA-C 02/06/23 1533    Veta Palma, PA-C 02/06/23 1557    Yolande Lamar BROCKS, MD 02/13/23 1151

## 2023-02-06 NOTE — ED Provider Triage Note (Signed)
 Emergency Medicine Provider Triage Evaluation Note  Justin [name] , a 73 y.o. male  was evaluated in triage.  Pt complains of 5 months of ongoing shortness of breath. Denies any acute worsening, denies any chest pain.  Denies any lower extremity swelling.  States he started on Levaquin 4 days ago without improvement in symptoms by his PCP. Review of Systems  Positive: As above Negative: As above  Physical Exam  BP 122/67 (BP Location: Right Arm)   Pulse 76   Temp 99.3 F (37.4 C) (Oral)   Resp 20   Ht 5' 10 (1.778 m)   Wt 115.7 kg   SpO2 98%   BMI 36.59 kg/m  Gen:   Awake, no distress   Resp:  Normal effort  MSK:   Moves extremities without difficulty    Medical Decision Making  Medically screening exam initiated at 1:39 PM.  Appropriate orders placed.  DAYLON LAFAVOR was informed that the remainder of the evaluation will be completed by another provider, this initial triage assessment does not replace that evaluation, and the importance of remaining in the ED until their evaluation is complete.     Veta Palma, PA-C 02/06/23 1340

## 2023-02-06 NOTE — Discharge Instructions (Addendum)
 We checked your blood counts, electrolytes, liver, and kidney function today which is normal.  You do not have any signs of infection.  Your chest x-ray does not show any signs of pneumonia any other abnormalities.  We checked signs of fluid overload which is normal. We checked your thyroid  function which is also normal.   You may use Flonase 2 puffs in each nostril daily to help with congestion.  You may continue using your saline nasal rinses at home.  You have been prescribed Tessalon  (benzonatate ) to help with your cough.  You may take this as needed up to every 8 hours.  Please follow-up with your PCP as soon as possible for further management of your symptoms.  Please follow-up with the pulmonologist listed below for further evaluation of symptoms.  Please attend your cardiology appointment on January 20th as discussed.   Return to the ER for any chest pain, shortness of breath to where you are unable to speak in full sentences, dizziness, any other new or concerning symptoms.

## 2023-02-06 NOTE — ED Triage Notes (Signed)
 Pt c/o SOB and cough x5 months.  Pt reports he was diagnosed w/ PNA x4 days ago and started on Levaquin.  Pt reports he is not feeling any better.

## 2023-02-07 ENCOUNTER — Telehealth: Payer: Self-pay | Admitting: *Deleted

## 2023-02-07 NOTE — Patient Outreach (Signed)
  Care Coordination   02/07/2023 Name: Justin [name] MRN: [id number] DOB: 12-24-1950   Care Coordination Outreach Attempts:  An unsuccessful telephone outreach was attempted today to offer the patient information about available complex care management services. Outreach attempted due to 2 ED visits in December 2024.   Follow Up Plan:  Additional outreach attempts will be made to offer the patient complex care management information and services.   Encounter Outcome:  No Answer   Care Coordination Interventions:  No, not indicated    Josette Pellet, RN, BSN Care Manager [facility]  Population Health  Direct Dial: [phone] Main #: [phone]

## 2023-02-09 DIAGNOSIS — I10 Essential (primary) hypertension: Secondary | ICD-10-CM | POA: Diagnosis not present

## 2023-02-09 DIAGNOSIS — J441 Chronic obstructive pulmonary disease with (acute) exacerbation: Secondary | ICD-10-CM | POA: Diagnosis not present

## 2023-02-09 DIAGNOSIS — Z299 Encounter for prophylactic measures, unspecified: Secondary | ICD-10-CM | POA: Diagnosis not present

## 2023-02-09 DIAGNOSIS — E1169 Type 2 diabetes mellitus with other specified complication: Secondary | ICD-10-CM | POA: Diagnosis not present

## 2023-02-09 DIAGNOSIS — E1142 Type 2 diabetes mellitus with diabetic polyneuropathy: Secondary | ICD-10-CM | POA: Diagnosis not present

## 2023-02-19 ENCOUNTER — Ambulatory Visit: Payer: 59 | Attending: Internal Medicine | Admitting: Internal Medicine

## 2023-02-19 ENCOUNTER — Encounter: Payer: Self-pay | Admitting: Internal Medicine

## 2023-02-19 VITALS — BP 112/64 | HR 76 | Ht 69.5 in | Wt 244.0 lb

## 2023-02-19 DIAGNOSIS — R002 Palpitations: Secondary | ICD-10-CM

## 2023-02-19 NOTE — Patient Instructions (Signed)

## 2023-02-19 NOTE — Progress Notes (Signed)
HPI Justin [name] returns today for followup. He is a pleasant 47 you man with PVC's, NSVT and NS AT. He has obesity. He has lost weight. When I saw him last I recommended he start taking toprol. He has also started dieting and has lost another 18 lbs. He has not had syncope. He still has palpitations but he does feel better. No chest pain or syncope. No edema. He has had problems with COPD and is pending Dr. Sherene Sires.  Allergies  Allergen Reactions   Gabapentin Nausea And Vomiting   Halcion [Triazolam] Other (See Comments)    Broke the hospital bed, altered mental state   Lisinopril Nausea And Vomiting   Pravastatin     headaches     Current Outpatient Medications  Medication Sig Dispense Refill   albuterol (VENTOLIN HFA) 108 (90 Base) MCG/ACT inhaler Inhale 2 puffs into the lungs 4 (four) times daily.     ALPRAZolam (XANAX) 1 MG tablet Take 1 mg by mouth 3 (three) times daily.      b complex vitamins capsule Take 1 capsule by mouth daily.     benzonatate (TESSALON) 100 MG capsule Take 1 capsule (100 mg total) by mouth every 8 (eight) hours. 21 capsule 0   Bioflavonoid Products (VITAMIN C) CHEW Chew 1 tablet by mouth daily.     Calcium-Magnesium-Zinc (CAL-MAG-ZINC PO) Take 1 tablet by mouth daily.     Coenzyme Q10 (COQ-10) 200 MG CAPS Take 400 mg by mouth daily.     CRANBERRY PO Take 2 tablets by mouth daily.     furosemide (LASIX) 40 MG tablet Take 40 mg by mouth daily.     Garlic 1000 MG CAPS Take 1,000 mg by mouth daily.     levothyroxine (SYNTHROID) 137 MCG tablet Take 137 mcg by mouth daily.     metFORMIN (GLUCOPHAGE) 500 MG tablet Take 500 mg by mouth daily as needed (high blood sugar).     metoprolol succinate (TOPROL-XL) 25 MG 24 hr tablet Take 1 tablet (25 mg total) by mouth daily. (Patient taking differently: Take 12.5 mg by mouth daily.) 90 tablet 3   nitroGLYCERIN (NITROSTAT) 0.4 MG SL tablet Place 1 tablet (0.4 mg total) under the tongue every 5 (five) minutes as needed  for chest pain. 25 tablet 3   Omega-3 Fatty Acids (FISH OIL) 1200 MG CAPS Take 1,200 mg by mouth daily.     oxyCODONE (ROXICODONE) 5 MG immediate release tablet Take 1 tablet (5 mg total) by mouth every 6 (six) hours as needed. 20 tablet 0   Oxymetazoline HCl (MUCINEX NASAL SPRAY FULL FORCE NA) Place 1 spray into the nose daily as needed (congestion).     PARoxetine (PAXIL) 20 MG tablet Take 10 mg by mouth daily.      Phenylephrine-Acetaminophen (TYLENOL SINUS+HEADACHE) 5-325 MG TABS Take 2 tablets by mouth daily as needed (sinuses / headaches).     Phenylephrine-DM-GG-APAP 5-10-200-325 MG/10ML LIQD Take 10 mLs by mouth daily as needed (congestion).     potassium chloride (KLOR-CON M) 10 MEQ tablet TAKE 1 TABLET BY MOUTH EVERY DAY 15 tablet 0   Red Yeast Rice 600 MG CAPS Take 600 mg by mouth daily.     silodosin (RAPAFLO) 8 MG CAPS capsule Take 1 capsule (8 mg total) by mouth at bedtime. 90 capsule 3   sodium chloride (OCEAN) 0.65 % SOLN nasal spray Place 1 spray into both nostrils as needed for congestion.     SYMBICORT 160-4.5 MCG/ACT  inhaler Inhale 2 puffs into the lungs 2 (two) times daily.     vitamin E 180 MG (400 UNITS) capsule Take 400 Units by mouth daily.     No current facility-administered medications for this visit.     Past Medical History:  Diagnosis Date   AF (atrial fibrillation) (HCC)    Anxiety and depression    Hypertension    Hypothyroidism    Obesity    Palpitations    SVT (supraventricular tachycardia) (HCC)     ROS:   All systems reviewed and negative except as noted in the HPI.   Past Surgical History:  Procedure Laterality Date   CARDIOVASCULAR STRESS TEST  09/05/2007   CHOLECYSTECTOMY     CYSTOSCOPY WITH INSERTION OF UROLIFT N/A 08/13/2017   Procedure: CYSTOSCOPY WITH INSERTION OF UROLIFT;  Surgeon: Malen Gauze, MD;  Location: [facility];  Service: Urology;  Laterality: N/A;   PAROTIDECTOMY  08/07/2018   PAROTIDECTOMY Right 08/07/2018   Procedure:  PAROTIDECTOMY;  Surgeon: Osborn Coho, MD;  Location: [facility];  Service: ENT;  Laterality: Right;   TONSILLECTOMY     XI ROBOTIC ASSISTED INGUINAL HERNIA REPAIR WITH MESH Right 11/06/2022   Procedure: XI ROBOTIC ASSISTED INGUINAL HERNIA REPAIR WITH MESH;  Surgeon: Franky Macho, MD;  Location: [facility];  Service: General;  Laterality: Right;     Family History  Problem Relation Age of Onset   Heart disease Mother    Heart disease Father      Social History   Socioeconomic History   Marital status: Married    Spouse name: Not on file   Number of children: Not on file   Years of education: Not on file   Highest education level: Not on file  Occupational History   Not on file  Tobacco Use   Smoking status: Former   Smokeless tobacco: Former    Types: Snuff  Vaping Use   Vaping status: Never Used  Substance and Sexual Activity   Alcohol use: No   Drug use: No   Sexual activity: Not on file  Other Topics Concern   Not on file  Social History Narrative   Not on file   Social Drivers of Health   Financial Resource Strain: Not on file  Food Insecurity: Not on file  Transportation Needs: Not on file  Physical Activity: Not on file  Stress: Not on file  Social Connections: Not on file  Intimate Partner Violence: Not on file     BP 112/64   Pulse 76   Ht 5' 9.5" (1.765 m)   Wt 244 lb (110.7 kg)   SpO2 96%   BMI 35.52 kg/m   Physical Exam:  Well appearing NAD HEENT: Unremarkable Neck:  No JVD, no thyromegally Lymphatics:  No adenopathy Back:  No CVA tenderness Lungs:  Clear HEART:  Regular rate rhythm, no murmurs, no rubs, no clicks Abd:  soft, positive bowel sounds, no organomegally, no rebound, no guarding Ext:  2 plus pulses, no edema, no cyanosis, no clubbing Skin:  No rashes no nodules Neuro:  CN II through XII intact, motor grossly intact  Assess/Plan:  Atrial and ventricular ectopy - he will continue his beta blocker and I have recommended he take  an extra toprol if needed for break through palpitations. No indication for flecainide. If needed, we could switch from a beta blocker to a calcium blocker. 2. Obesity - he has lost another 18 lbs. Continue 3. Anxiety - he will continue his  xanax 4. HTN - his bp is improved on the toprol and the weight loss. I encouraged him to avoid salty foods.   Justin Gowda Jahir Halt,MD

## 2023-02-23 DIAGNOSIS — G720 Drug-induced myopathy: Secondary | ICD-10-CM | POA: Diagnosis not present

## 2023-02-23 DIAGNOSIS — I480 Paroxysmal atrial fibrillation: Secondary | ICD-10-CM | POA: Diagnosis not present

## 2023-02-23 DIAGNOSIS — Z299 Encounter for prophylactic measures, unspecified: Secondary | ICD-10-CM | POA: Diagnosis not present

## 2023-02-23 DIAGNOSIS — J441 Chronic obstructive pulmonary disease with (acute) exacerbation: Secondary | ICD-10-CM | POA: Diagnosis not present

## 2023-02-23 DIAGNOSIS — I10 Essential (primary) hypertension: Secondary | ICD-10-CM | POA: Diagnosis not present

## 2023-03-12 NOTE — Progress Notes (Signed)
Justin [name], male    DOB: Aug 25, 1950    MRN: [id number]   Brief patient profile:  52  yowm quit smoking around 1995 at wt 195  referred to pulmonary clinic in [facility]  03/14/2023 by EDP at [facility]  for doe x 2024       History of Present Illness  03/14/2023  Pulmonary/ 1st office eval/ Alishah [facility] / [facility] maint on symbicort  160 (breztri made his symptoms worse) at wt 252  Chief Complaint  Patient presents with   Consult   Shortness of Breath  Dyspnea:  suddenly noted  doe with exertion in August 2024 but also feels more sob talking assoc with hoarseness.  Cough: dry hack night > day assoc with nasal congestion > 10  years prior to OV   Sleep: flat bed one pillow  SABA use: takes it bid  02: none    No obvious day to day or daytime pattern/variability or assoc excess/ purulent sputum or mucus plugs or hemoptysis or cp or chest tightness, subjective wheeze or overt hb symptoms.    Also denies any obvious fluctuation of symptoms with weather or environmental changes or other aggravating or alleviating factors except as outlined above   No unusual exposure hx or h/o childhood pna/ asthma or knowledge of premature birth.  Current Allergies, Complete Past Medical History, Past Surgical History, Family History, and Social History were reviewed in [employer] record.  ROS  The following are not active complaints unless bolded Hoarseness, sore throat, dysphagia, dental problems, itching, sneezing,  nasal congestion or discharge of excess mucus or purulent secretions, ear ache,   fever, chills, sweats, unintended wt loss or wt gain, classically pleuritic or exertional cp,  orthopnea pnd or arm/hand swelling  or leg swelling, presyncope, palpitations, abdominal pain, anorexia, nausea, vomiting, diarrhea  or change in bowel habits or change in bladder habits, change in stools or change in urine, dysuria, hematuria,  rash, arthralgias, visual complaints,  headache, numbness, weakness or ataxia or problems with walking or coordination,  change in mood or  memory.            Outpatient Medications Prior to Visit  Medication Sig Dispense Refill   albuterol (VENTOLIN HFA) 108 (90 Base) MCG/ACT inhaler Inhale 2 puffs into the lungs 4 (four) times daily.     ALPRAZolam (XANAX) 1 MG tablet Take 1 mg by mouth 3 (three) times daily.      b complex vitamins capsule Take 1 capsule by mouth daily.     benzonatate (TESSALON) 100 MG capsule Take 1 capsule (100 mg total) by mouth every 8 (eight) hours. 21 capsule 0   Bioflavonoid Products (VITAMIN C) CHEW Chew 1 tablet by mouth daily.     Calcium-Magnesium-Zinc (CAL-MAG-ZINC PO) Take 1 tablet by mouth daily.     Coenzyme Q10 (COQ-10) 200 MG CAPS Take 400 mg by mouth daily.     CRANBERRY PO Take 2 tablets by mouth daily.     furosemide (LASIX) 40 MG tablet Take 40 mg by mouth daily.     Garlic 1000 MG CAPS Take 1,000 mg by mouth daily.     levothyroxine (SYNTHROID) 137 MCG tablet Take 137 mcg by mouth daily.     metFORMIN (GLUCOPHAGE) 500 MG tablet Take 500 mg by mouth daily as needed (high blood sugar).     metoprolol succinate (TOPROL-XL) 25 MG 24 hr tablet Take 1 tablet (25 mg total) by mouth daily. (Patient taking differently: Take  12.5 mg by mouth daily.) 90 tablet 3   nitroGLYCERIN (NITROSTAT) 0.4 MG SL tablet Place 1 tablet (0.4 mg total) under the tongue every 5 (five) minutes as needed for chest pain. 25 tablet 3   Omega-3 Fatty Acids (FISH OIL) 1200 MG CAPS Take 1,200 mg by mouth daily.     oxyCODONE (ROXICODONE) 5 MG immediate release tablet Take 1 tablet (5 mg total) by mouth every 6 (six) hours as needed. 20 tablet 0   Oxymetazoline HCl (MUCINEX NASAL SPRAY FULL FORCE NA) Place 1 spray into the nose daily as needed (congestion).     PARoxetine (PAXIL) 20 MG tablet Take 10 mg by mouth daily.      Phenylephrine-Acetaminophen (TYLENOL SINUS+HEADACHE) 5-325 MG TABS Take 2 tablets by mouth daily as  needed (sinuses / headaches).     Phenylephrine-DM-GG-APAP 5-10-200-325 MG/10ML LIQD Take 10 mLs by mouth daily as needed (congestion).     potassium chloride (KLOR-CON M) 10 MEQ tablet TAKE 1 TABLET BY MOUTH EVERY DAY 15 tablet 0   Red Yeast Rice 600 MG CAPS Take 600 mg by mouth daily.     silodosin (RAPAFLO) 8 MG CAPS capsule Take 1 capsule (8 mg total) by mouth at bedtime. 90 capsule 3   sodium chloride (OCEAN) 0.65 % SOLN nasal spray Place 1 spray into both nostrils as needed for congestion.     SYMBICORT 160-4.5 MCG/ACT inhaler Inhale 2 puffs into the lungs 2 (two) times daily.     vitamin E 180 MG (400 UNITS) capsule Take 400 Units by mouth daily.     No facility-administered medications prior to visit.    Past Medical History:  Diagnosis Date   AF (atrial fibrillation) (HCC)    Anxiety and depression    Hypertension    Hypothyroidism    Obesity    Palpitations    SVT (supraventricular tachycardia) (HCC)       Objective:     BP 119/69   Pulse 84   Ht 5' 9.5" (1.765 m)   Wt 252 lb (114.3 kg)   SpO2 95% Comment: room air  BMI 36.68 kg/m   SpO2: 95 % (room air)  Pleasant amb wm/ classic voice fatigue / nad    HEENT : Oropharynx  clear      Nasal turbinates nl / rhinophyma present   NECK :  without  apparent JVD/ palpable Nodes/TM    LUNGS: no acc muscle use,  Nl contour chest which is clear to A and P bilaterally without cough on insp or exp maneuvers   CV:  RRR  no s3 or murmur or increase in P2, and no edema   ABD:  obese soft and nontender   MS:  Gait nl   ext warm without deformities Or obvious joint restrictions  calf tenderness, cyanosis or clubbing    SKIN: warm and dry without lesions    NEURO:  alert, approp, nl sensorium with  no motor or cerebellar deficits apparent.    I personally reviewed images and agree with radiology impression as follows:  CXR:   pa and lateral 02/06/23 No active dz, relatively low lung volumes    Assessment   Cough  variant asthma vs upper airway cough syndrome Onset 2024  assoc with rhinitis/ hoarsness  ? Gerd and worse on breztri than symb 160  - 03/14/2023  After extensive coaching inhaler device,  effectiveness =    80% from a baseline of 50% so try symb 80 2bid  -  sinus CT 03/14/2023 >>>  - max gerd rx  03/14/2023 >>>   DDX of  difficult airways management almost all start with A and  include Adherence, Ace Inhibitors, Acid Reflux, Active Sinus Disease, Alpha 1 Antitripsin deficiency, Anxiety masquerading as Airways dz,  ABPA,  Allergy(esp in young), Aspiration (esp in elderly), Adverse effects of meds,  Active smoking or vaping, A bunch of PE's (a small clot burden can't cause this syndrome unless there is already severe underlying pulm or vascular dz with poor reserve) plus two Bs  = Bronchiectasis and Beta blocker use..and one C= CHF   Adherence is always the initial "prime suspect" and is a multilayered concern that requires a "trust but verify" approach in every patient - starting with knowing how to use medications, especially inhalers, correctly, keeping up with refills and understanding the fundamental difference between maintenance and prns vs those medications only taken for a very short course and then stopped and not refilled.  - see hfa teaching, return with inhalers at 6 weeks   ? Acid (or non-acid) GERD > always difficult to exclude as up to 75% of pts in some series report no assoc GI/ Heartburn symptoms> rec max (24h)  acid suppression and diet restrictions/ reviewed and instructions given in writing.   ? Allergy >  Eos 0.4 noted but  I suspect this is just from rhinitis/ not asthma, and high dose ICS// lama backfired here likely to affect on upper airway so try symbiocrt 80 2bid   ? Active sinus dz > sinus CT  ? CHF   BNP < 100 twice in last month   DOE (dyspnea on exertion) quit smoking around 1995 at wt 195  - 03/14/2023   Walked on RA@ 252   x  3  lap(s) =  approx 450  ft  @ mod pace,  stopped due to end of study  with lowest 02 sats 90% with some coughing but no doe    When respiratory symptoms begin or become refractory well after a patient reports complete smoking cessation,  Especially when this wasn't the case while they were smoking, a red flag is raised based on the work of Dr Primitivo Gauze which states:  if you quit smoking when your best day FEV1 is still well preserved it is highly unlikely you will progress to severe disease.  That is to say, once the smoking stops,  the symptoms should not suddenly erupt or markedly worsen.  If so, the differential diagnosis should include  obesity/deconditioning,  LPR/Reflux/Aspiration syndromes,  occult CHF, or  especially side effect of medications commonly used in this population.    Will work on reconditioning/ further wt loss while awaiting   PFTS ordered today   Each maintenance medication was reviewed in detail including emphasizing most importantly the difference between maintenance and prns and under what circumstances the prns are to be triggered using an action plan format where appropriate.  Total time for H and P, chart review, counseling, reviewing hfa device(s) , directly observing portions of ambulatory 02 saturation study/ and generating customized AVS unique to this office visit / same day charting  > 60 min new pt eval                   Sandrea Hughs, MD 03/14/2023

## 2023-03-14 ENCOUNTER — Encounter: Payer: Self-pay | Admitting: Internal Medicine

## 2023-03-14 ENCOUNTER — Ambulatory Visit: Payer: 59 | Admitting: Internal Medicine

## 2023-03-14 VITALS — BP 119/69 | HR 84 | Ht 69.5 in | Wt 252.0 lb

## 2023-03-14 DIAGNOSIS — R0609 Other forms of dyspnea: Secondary | ICD-10-CM

## 2023-03-14 DIAGNOSIS — J45991 Cough variant asthma: Secondary | ICD-10-CM | POA: Diagnosis not present

## 2023-03-14 MED ORDER — BUDESONIDE-FORMOTEROL FUMARATE 80-4.5 MCG/ACT IN AERO: INHALATION_SPRAY | RESPIRATORY_TRACT | 12 refills | Status: DC

## 2023-03-14 MED ORDER — PANTOPRAZOLE SODIUM 40 MG PO TBEC: 40.0000 mg | DELAYED_RELEASE_TABLET | Freq: Every day | ORAL | 2 refills | Status: DC

## 2023-03-14 MED ORDER — FAMOTIDINE 20 MG PO TABS: ORAL_TABLET | ORAL | 11 refills | Status: AC

## 2023-03-14 NOTE — Assessment & Plan Note (Signed)
quit smoking around 1995 at wt 195  - 03/14/2023   Walked on RA@ 252   x  3  lap(s) =  approx 450  ft  @ mod pace, stopped due to end of study  with lowest 02 sats 90% with some coughing but no doe    When respiratory symptoms begin or become refractory well after a patient reports complete smoking cessation,  Especially when this wasn't the case while they were smoking, a red flag is raised based on the work of Dr Primitivo Gauze which states:  if you quit smoking when your best day FEV1 is still well preserved it is highly unlikely you will progress to severe disease.  That is to say, once the smoking stops,  the symptoms should not suddenly erupt or markedly worsen.  If so, the differential diagnosis should include  obesity/deconditioning,  LPR/Reflux/Aspiration syndromes,  occult CHF, or  especially side effect of medications commonly used in this population.    Will work on reconditioning/ further wt loss while awaiting   PFTS ordered today   Each maintenance medication was reviewed in detail including emphasizing most importantly the difference between maintenance and prns and under what circumstances the prns are to be triggered using an action plan format where appropriate.  Total time for H and P, chart review, counseling, reviewing hfa device(s) , directly observing portions of ambulatory 02 saturation study/ and generating customized AVS unique to this office visit / same day charting  > 60 min new pt eval

## 2023-03-14 NOTE — Assessment & Plan Note (Signed)
Onset 2024  assoc with rhinitis/ hoarsness  ? Gerd and worse on breztri than symb 160  - 03/14/2023  After extensive coaching inhaler device,  effectiveness =    80% from a baseline of 50% so try symb 80 2bid  - sinus CT 03/14/2023 >>>  - max gerd rx  03/14/2023 >>>   DDX of  difficult airways management almost all start with A and  include Adherence, Ace Inhibitors, Acid Reflux, Active Sinus Disease, Alpha 1 Antitripsin deficiency, Anxiety masquerading as Airways dz,  ABPA,  Allergy(esp in young), Aspiration (esp in elderly), Adverse effects of meds,  Active smoking or vaping, A bunch of PE's (a small clot burden can't cause this syndrome unless there is already severe underlying pulm or vascular dz with poor reserve) plus two Bs  = Bronchiectasis and Beta blocker use..and one C= CHF   Adherence is always the initial "prime suspect" and is a multilayered concern that requires a "trust but verify" approach in every patient - starting with knowing how to use medications, especially inhalers, correctly, keeping up with refills and understanding the fundamental difference between maintenance and prns vs those medications only taken for a very short course and then stopped and not refilled.  - see hfa teaching, return with inhalers at 6 weeks   ? Acid (or non-acid) GERD > always difficult to exclude as up to 75% of pts in some series report no assoc GI/ Heartburn symptoms> rec max (24h)  acid suppression and diet restrictions/ reviewed and instructions given in writing.   ? Allergy >  Eos 0.4 noted but  I suspect this is just from rhinitis/ not asthma, and high dose ICS// lama backfired here likely to affect on upper airway so try symbiocrt 80 2bid   ? Active sinus dz > sinus CT  ? CHF   BNP < 100 twice in last month

## 2023-03-14 NOTE — Patient Instructions (Addendum)
Plan A = Automatic = Always=    Symbicort 80 Take 2 puffs first thing in am and then another 2 puffs about 12 hours later.   Work on inhaler technique:  relax and gently blow all the way out then take a nice smooth full deep breath back in, triggering the inhaler at same time you start breathing in.  Hold breath in for at least  5 seconds if you can. Blow out symbicort  thru nose. Rinse and gargle with water when done.  If mouth or throat bother you at all,  try brushing teeth/gums/tongue with arm and hammer toothpaste/ make a slurry and gargle and spit out.     Plan B = Backup (to supplement plan A, not to replace it) Only use your albuterol inhaler as a rescue medication to be used if you can't catch your breath by resting or doing a relaxed purse lip breathing pattern.  - The less you use it, the better it will work when you need it. - Ok to use the inhaler up to 2 puffs  every 4 hours if you must but call for appointment if use goes up over your usual need - Don't leave home without it !!  (think of it like the spare tire for your car)      Pantoprazole (protonix) 40 mg   Take  30-60 min before first meal of the day and Pepcid (famotidine)  20 mg after supper until return to office - this is the best way to tell whether stomach acid is contributing to your problem.    GERD (REFLUX)  is an extremely common cause of respiratory symptoms just like yours , many times with no obvious heartburn at all.    It can be treated with medication, but also with lifestyle changes including elevation of the head of your bed (ideally with 6 -8inch blocks under the headboard of your bed),  Smoking cessation, avoidance of late meals, excessive alcohol, and avoid fatty foods, chocolate, peppermint, colas, red wine, and acidic juices such as orange juice.  NO MINT OR MENTHOL PRODUCTS SO NO COUGH DROPS  USE SUGARLESS CANDY INSTEAD (Jolley ranchers or Stover's or Life Savers) or even ice chips will also do - the key  is to swallow to prevent all throat clearing. NO OIL BASED VITAMINS - use powdered substitutes.  Avoid fish oil when coughing.    My office will be contacting you by phone for referral for sinus CT and PFTs - if you don't hear back from my office within one week please call us back or notify us thru MyChart and we'll address it right away  Please schedule a follow up office visit in 6 weeks, call sooner if needed - bring inhalers

## 2023-03-16 ENCOUNTER — Institutional Professional Consult (permissible substitution): Payer: 59 | Admitting: Internal Medicine

## 2023-03-21 ENCOUNTER — Ambulatory Visit (HOSPITAL_COMMUNITY)
Admission: RE | Admit: 2023-03-21 | Discharge: 2023-03-21 | Disposition: A | Discharge status: Home / Self Care | Payer: 59 | Source: Ambulatory Visit | Attending: Internal Medicine | Admitting: Internal Medicine

## 2023-03-21 DIAGNOSIS — R059 Cough, unspecified: Secondary | ICD-10-CM | POA: Diagnosis not present

## 2023-03-21 DIAGNOSIS — J329 Chronic sinusitis, unspecified: Secondary | ICD-10-CM | POA: Diagnosis not present

## 2023-03-21 DIAGNOSIS — J45991 Cough variant asthma: Secondary | ICD-10-CM | POA: Insufficient documentation

## 2023-03-21 DIAGNOSIS — J342 Deviated nasal septum: Secondary | ICD-10-CM | POA: Diagnosis not present

## 2023-03-26 ENCOUNTER — Ambulatory Visit: Payer: 59 | Admitting: Urology

## 2023-03-26 VITALS — BP 119/68 | HR 66

## 2023-03-26 DIAGNOSIS — R3912 Poor urinary stream: Secondary | ICD-10-CM

## 2023-03-26 DIAGNOSIS — N138 Other obstructive and reflux uropathy: Secondary | ICD-10-CM

## 2023-03-26 DIAGNOSIS — N401 Enlarged prostate with lower urinary tract symptoms: Secondary | ICD-10-CM

## 2023-03-26 LAB — URINALYSIS, ROUTINE W REFLEX MICROSCOPIC
Bilirubin, UA: NEGATIVE
Glucose, UA: NEGATIVE
Ketones, UA: NEGATIVE
Leukocytes,UA: NEGATIVE
Nitrite, UA: NEGATIVE
Protein,UA: NEGATIVE
RBC, UA: NEGATIVE
Specific Gravity, UA: 1.01 (ref 1.005–1.030)
Urobilinogen, Ur: 0.2 mg/dL (ref 0.2–1.0)
pH, UA: 6 (ref 5.0–7.5)

## 2023-03-26 LAB — BLADDER SCAN AMB NON-IMAGING: Scan Result: 0

## 2023-03-26 MED ORDER — SILODOSIN 8 MG PO CAPS: 8.0000 mg | ORAL_CAPSULE | Freq: Every day | ORAL | 3 refills | Status: DC

## 2023-03-26 NOTE — Progress Notes (Unsigned)
 Bladder Scan completed today.  Patient can void prior to the bladder scan. Bladder scan result: 0  Performed By: [facility] LPN

## 2023-03-26 NOTE — Progress Notes (Unsigned)
 03/26/2023 9:35 AM   Justin [name] 1950-09-06 [id number]  Referring provider: Kirstie Peri, MD [address] [place],  [place] [address]  No chief complaint on file.   HPI: Mr Justin [name] is a 72yo here for followup for BPhw ith a weak urinary stream. IPSS 4 QOl 1 off his rapaflo. Nocturia 0x. Urine stream strong. No straining to urinate. No urinary hesitancy.    PMH: Past Medical History:  Diagnosis Date   AF (atrial fibrillation) (HCC)    Anxiety and depression    Hypertension    Hypothyroidism    Obesity    Palpitations    SVT (supraventricular tachycardia) (HCC)     Surgical History: Past Surgical History:  Procedure Laterality Date   CARDIOVASCULAR STRESS TEST  09/05/2007   CHOLECYSTECTOMY     CYSTOSCOPY WITH INSERTION OF UROLIFT N/A 08/13/2017   Procedure: CYSTOSCOPY WITH INSERTION OF UROLIFT;  Surgeon: Justin Gauze, MD;  Location: [facility];  Service: Urology;  Laterality: N/A;   PAROTIDECTOMY  08/07/2018   PAROTIDECTOMY Right 08/07/2018   Procedure: PAROTIDECTOMY;  Surgeon: Justin Coho, MD;  Location: [facility];  Service: ENT;  Laterality: Right;   TONSILLECTOMY     XI ROBOTIC ASSISTED INGUINAL HERNIA REPAIR WITH MESH Right 11/06/2022   Procedure: XI ROBOTIC ASSISTED INGUINAL HERNIA REPAIR WITH MESH;  Surgeon: Justin Macho, MD;  Location: [facility];  Service: General;  Laterality: Right;    Home Medications:  Allergies as of 03/26/2023       Reactions   Breztri Aerosphere [budeson-glycopyrrol-formoterol]    Gabapentin Nausea And Vomiting   Halcion [triazolam] Other (See Comments)   Broke the hospital bed, altered mental state   Lisinopril Nausea And Vomiting   Pravastatin    headaches   Yupelri [revefenacin]         Medication List        Accurate as of March 26, 2023  9:35 AM. If you have any questions, ask your nurse or doctor.          albuterol 108 (90 Base) MCG/ACT inhaler Commonly known as: VENTOLIN HFA Inhale 2 puffs into the lungs 4  (four) times daily.   ALPRAZolam 1 MG tablet Commonly known as: XANAX Take 1 mg by mouth 3 (three) times daily.   b complex vitamins capsule Take 1 capsule by mouth daily.   benzonatate 100 MG capsule Commonly known as: TESSALON Take 1 capsule (100 mg total) by mouth every 8 (eight) hours.   budesonide-formoterol 80-4.5 MCG/ACT inhaler Commonly known as: Symbicort Take 2 puffs first thing in am and then another 2 puffs about 12 hours later.   CAL-MAG-ZINC PO Take 1 tablet by mouth daily.   CoQ-10 200 MG Caps Take 400 mg by mouth daily.   CRANBERRY PO Take 2 tablets by mouth daily.   famotidine 20 MG tablet Commonly known as: Pepcid One after supper   Fish Oil 1200 MG Caps Take 1,200 mg by mouth daily.   furosemide 40 MG tablet Commonly known as: LASIX Take 40 mg by mouth daily.   Garlic 1000 MG Caps Take 1,000 mg by mouth daily.   levothyroxine 137 MCG tablet Commonly known as: SYNTHROID Take 137 mcg by mouth daily.   metFORMIN 500 MG tablet Commonly known as: GLUCOPHAGE Take 500 mg by mouth daily as needed (high blood sugar).   metoprolol succinate 25 MG 24 hr tablet Commonly known as: TOPROL-XL Take 1 tablet (25 mg total) by mouth daily. What changed: how much to  take   MUCINEX NASAL SPRAY FULL FORCE NA Place 1 spray into the nose daily as needed (congestion).   nitroGLYCERIN 0.4 MG SL tablet Commonly known as: NITROSTAT Place 1 tablet (0.4 mg total) under the tongue every 5 (five) minutes as needed for chest pain.   oxyCODONE 5 MG immediate release tablet Commonly known as: Roxicodone Take 1 tablet (5 mg total) by mouth every 6 (six) hours as needed.   pantoprazole 40 MG tablet Commonly known as: Protonix Take 1 tablet (40 mg total) by mouth daily. Take 30-60 min before first meal of the day   PARoxetine 20 MG tablet Commonly known as: PAXIL Take 10 mg by mouth daily.   Phenylephrine-DM-GG-APAP 5-10-200-325 MG/10ML Liqd Take 10 mLs by mouth  daily as needed (congestion).   potassium chloride 10 MEQ tablet Commonly known as: KLOR-CON M TAKE 1 TABLET BY MOUTH EVERY DAY   Red Yeast Rice 600 MG Caps Take 600 mg by mouth daily.   silodosin 8 MG Caps capsule Commonly known as: RAPAFLO Take 1 capsule (8 mg total) by mouth at bedtime.   sodium chloride 0.65 % Soln nasal spray Commonly known as: OCEAN Place 1 spray into both nostrils as needed for congestion.   Tylenol Sinus+Headache 5-325 MG Tabs Generic drug: Phenylephrine-Acetaminophen Take 2 tablets by mouth daily as needed (sinuses / headaches).   Vitamin C Chew Chew 1 tablet by mouth daily.   vitamin E 180 MG (400 UNITS) capsule Take 400 Units by mouth daily.        Allergies:  Allergies  Allergen Reactions   Breztri Aerosphere [Budeson-Glycopyrrol-Formoterol]    Gabapentin Nausea And Vomiting   Halcion [Triazolam] Other (See Comments)    Broke the hospital bed, altered mental state   Lisinopril Nausea And Vomiting   Pravastatin     headaches   Yupelri [Revefenacin]     Family History: Family History  Problem Relation Age of Onset   Heart disease Mother    Heart disease Father     Social History:  reports that he has quit smoking. He has quit using smokeless tobacco.  His smokeless tobacco use included snuff. He reports that he does not drink alcohol and does not use drugs.  ROS: All other review of systems were reviewed and are negative except what is noted above in HPI  Physical Exam: BP 119/68   Pulse 66   Constitutional:  Alert and oriented, No acute distress. HEENT: Justin [name] AT, moist mucus membranes.  Trachea midline, no masses. Cardiovascular: No clubbing, cyanosis, or edema. Respiratory: Normal respiratory effort, no increased work of breathing. GI: Abdomen is soft, nontender, nondistended, no abdominal masses GU: No CVA tenderness.  Lymph: No cervical or inguinal lymphadenopathy. Skin: No rashes, bruises or suspicious  lesions. Neurologic: Grossly intact, no focal deficits, moving all 4 extremities. Psychiatric: Normal mood and affect.  Laboratory Data: Lab Results  Component Value Date   WBC 6.3 02/06/2023   HGB 14.7 02/06/2023   HCT 42.7 02/06/2023   MCV 98.6 02/06/2023   PLT 124 (L) 02/06/2023    Lab Results  Component Value Date   CREATININE 1.18 02/06/2023    No results found for: "PSA"  No results found for: "TESTOSTERONE"  No results found for: "HGBA1C"  Urinalysis    Component Value Date/Time   COLORURINE YELLOW 11/20/2016 2039   APPEARANCEUR Clear 03/27/2022 1135   LABSPEC 1.015 11/20/2016 2039   PHURINE 5.0 11/20/2016 2039   GLUCOSEU Negative 03/27/2022 1135   HGBUR NEGATIVE  11/20/2016 2039   BILIRUBINUR Negative 03/27/2022 1135   KETONESUR NEGATIVE 11/20/2016 2039   PROTEINUR Negative 03/27/2022 1135   PROTEINUR NEGATIVE 11/20/2016 2039   NITRITE Negative 03/27/2022 1135   NITRITE NEGATIVE 11/20/2016 2039   LEUKOCYTESUR Negative 03/27/2022 1135    Lab Results  Component Value Date   LABMICR Comment 03/27/2022   WBCUA None seen 09/27/2021   LABEPIT None seen 09/27/2021   MUCUS Present 11/10/2020   BACTERIA None seen 09/27/2021    Pertinent Imaging: *** No results found for this or any previous visit.  No results found for this or any previous visit.  No results found for this or any previous visit.  No results found for this or any previous visit.  No results found for this or any previous visit.  No results found for this or any previous visit.  No results found for this or any previous visit.  No results found for this or any previous visit.   Assessment & Plan:    1. Benign prostatic hyperplasia with urinary obstruction (Primary) *** - Urinalysis, Routine w reflex microscopic - BLADDER SCAN AMB NON-IMAGING  2. Weak urinary stream ***   No follow-ups on file.  [name], MD  [facility] [place]

## 2023-03-29 ENCOUNTER — Encounter: Payer: Self-pay | Admitting: Urology

## 2023-03-29 ENCOUNTER — Telehealth: Payer: Self-pay | Admitting: Internal Medicine

## 2023-03-29 NOTE — Patient Instructions (Signed)

## 2023-03-29 NOTE — Telephone Encounter (Signed)
 Spoke with patient regarding the 05/10/23 11:30 am PFT appointment at [facility] time is 11:15 am---1st floor registration desk---follow up with Dr. Sherene Sires 05/10/23 at 1:30 pm in the [place] office---will mail information to patent and he voiced his understanding

## 2023-03-30 DIAGNOSIS — E1142 Type 2 diabetes mellitus with diabetic polyneuropathy: Secondary | ICD-10-CM | POA: Diagnosis not present

## 2023-03-30 DIAGNOSIS — M62838 Other muscle spasm: Secondary | ICD-10-CM | POA: Diagnosis not present

## 2023-03-30 DIAGNOSIS — I10 Essential (primary) hypertension: Secondary | ICD-10-CM | POA: Diagnosis not present

## 2023-03-30 DIAGNOSIS — Z299 Encounter for prophylactic measures, unspecified: Secondary | ICD-10-CM | POA: Diagnosis not present

## 2023-04-24 ENCOUNTER — Ambulatory Visit: Payer: 59 | Admitting: Internal Medicine

## 2023-04-24 DIAGNOSIS — R059 Cough, unspecified: Secondary | ICD-10-CM | POA: Diagnosis not present

## 2023-04-24 DIAGNOSIS — R0981 Nasal congestion: Secondary | ICD-10-CM | POA: Diagnosis not present

## 2023-04-25 DIAGNOSIS — Z299 Encounter for prophylactic measures, unspecified: Secondary | ICD-10-CM | POA: Diagnosis not present

## 2023-04-25 DIAGNOSIS — E1169 Type 2 diabetes mellitus with other specified complication: Secondary | ICD-10-CM | POA: Diagnosis not present

## 2023-04-25 DIAGNOSIS — I10 Essential (primary) hypertension: Secondary | ICD-10-CM | POA: Diagnosis not present

## 2023-04-25 DIAGNOSIS — R0981 Nasal congestion: Secondary | ICD-10-CM | POA: Diagnosis not present

## 2023-05-01 DIAGNOSIS — I10 Essential (primary) hypertension: Secondary | ICD-10-CM | POA: Diagnosis not present

## 2023-05-01 DIAGNOSIS — J449 Chronic obstructive pulmonary disease, unspecified: Secondary | ICD-10-CM | POA: Diagnosis not present

## 2023-05-01 DIAGNOSIS — R52 Pain, unspecified: Secondary | ICD-10-CM | POA: Diagnosis not present

## 2023-05-01 DIAGNOSIS — Z0000 Encounter for general adult medical examination without abnormal findings: Secondary | ICD-10-CM | POA: Diagnosis not present

## 2023-05-01 DIAGNOSIS — Z7189 Other specified counseling: Secondary | ICD-10-CM | POA: Diagnosis not present

## 2023-05-01 DIAGNOSIS — Z299 Encounter for prophylactic measures, unspecified: Secondary | ICD-10-CM | POA: Diagnosis not present

## 2023-05-01 DIAGNOSIS — I700 Atherosclerosis of aorta: Secondary | ICD-10-CM | POA: Diagnosis not present

## 2023-05-06 NOTE — Progress Notes (Unsigned)
 Justin [name], male    DOB: 06/15/1950    MRN: [id number]   Brief patient profile:  42  yowm quit smoking around 1995 at wt 89  referred to pulmonary clinic in [facility]  03/14/2023 by EDP at [facility]  for doe x 2024     History of Present Illness  03/14/2023  Pulmonary/ 1st office eval/ Zaiyah Sottile / [facility] maint on symbicort  160 (breztri made his symptoms worse) at wt 252  Chief Complaint  Patient presents with   Consult   Shortness of Breath  Dyspnea:  suddenly noted  doe with exertion in August 2024 but also feels more sob talking assoc with hoarseness.  Cough: dry hack night > day assoc with nasal congestion > 10  years prior to OV   Sleep: flat bed one pillow  SABA use: takes it bid  02: none Rec Plan A = Automatic = Always=    Symbicort 80 Take 2 puffs first thing in am and then another 2 puffs about 12 hours later.  Work on inhaler technique: Plan B = Backup (to supplement plan A, not to replace it) Only use your albuterol inhaler as a rescue medication  Pantoprazole (protonix) 40 mg   Take  30-60 min before first meal of the day and Pepcid (famotidine)  20 mg after supper until return to office  GERD diet reviewed, bed blocks rec  My office will be contacting you by phone for referral for  PFTs > not done as of 05/10/2023  Please schedule a follow up office visit in 6 weeks  bring inhalers  Sinus CT 03/21/23 :  minimal thickening, with R septal deviation.   05/10/2023  f/u ov/[place] [facility]/Poppi Scantling re: doe /uacs  maint on symb 80    did not bring meds  Chief Complaint  Patient presents with   COPD   Dyspnea:  heavy exertion only like steps x sev flights  Cough: mostly after supper better with otc rx (doesn't recall name)  Sleeping: flat bed one pillow and cough settles down noct s   resp cc  SABA use: rarely if ever 02: none     No obvious day to day or daytime variability or assoc excess/ purulent sputum or mucus plugs or hemoptysis or cp or chest tightness,  subjective wheeze or over hb symptoms.    Also denies any obvious fluctuation of symptoms with weather or environmental changes or other aggravating or alleviating factors except as outlined above   No unusual exposure hx or h/o childhood pna/ asthma or knowledge of premature birth.  Current Allergies, Complete Past Medical History, Past Surgical History, Family History, and Social History were reviewed in [employer] record.  ROS  The following are not active complaints unless bolded Hoarseness, sore throat, dysphagia, dental problems, itching, sneezing,  nasal congestion or discharge of excess mucus or purulent secretions, ear ache,   fever, chills, sweats, unintended wt loss or wt gain, classically pleuritic or exertional cp,  orthopnea pnd or arm/hand swelling  or leg swelling, presyncope, palpitations, abdominal pain, anorexia, nausea, vomiting, diarrhea  or change in bowel habits or change in bladder habits, change in stools or change in urine, dysuria, hematuria,  rash, arthralgias, visual complaints, headache, numbness, weakness or ataxia or problems with walking or coordination,  change in mood or  memory.        Current Meds  Medication Sig   albuterol (VENTOLIN HFA) 108 (90 Base) MCG/ACT inhaler Inhale 2 puffs into  the lungs 4 (four) times daily.   ALPRAZolam (XANAX) 1 MG tablet Take 1 mg by mouth 3 (three) times daily.    b complex vitamins capsule Take 1 capsule by mouth daily.   benzonatate (TESSALON) 100 MG capsule Take 1 capsule (100 mg total) by mouth every 8 (eight) hours.   Bioflavonoid Products (VITAMIN C) CHEW Chew 1 tablet by mouth daily.   BREZTRI AEROSPHERE 160-9-4.8 MCG/ACT AERO inhaler SMARTSIG:2 Puff(s) By Mouth Twice Daily   budesonide (PULMICORT) 0.5 MG/2ML nebulizer solution Take 2 mLs by nebulization 2 (two) times daily.   budesonide-formoterol (SYMBICORT) 80-4.5 MCG/ACT inhaler Take 2 puffs first thing in am and then another 2 puffs about 12  hours later.   Calcium-Magnesium-Zinc (CAL-MAG-ZINC PO) Take 1 tablet by mouth daily.   Coenzyme Q10 (COQ-10) 200 MG CAPS Take 400 mg by mouth daily.   CRANBERRY PO Take 2 tablets by mouth daily.   famotidine (PEPCID) 20 MG tablet One after supper   furosemide (LASIX) 40 MG tablet Take 40 mg by mouth daily.   Garlic 1000 MG CAPS Take 1,000 mg by mouth daily.   levofloxacin (LEVAQUIN) 500 MG tablet Take 500 mg by mouth daily.   levothyroxine (SYNTHROID) 137 MCG tablet Take 137 mcg by mouth daily.   meloxicam (MOBIC) 15 MG tablet Take 15 mg by mouth daily as needed.   metFORMIN (GLUCOPHAGE) 500 MG tablet Take 500 mg by mouth daily as needed (high blood sugar).   metoprolol succinate (TOPROL-XL) 25 MG 24 hr tablet Take 1 tablet (25 mg total) by mouth daily. (Patient taking differently: Take 12.5 mg by mouth daily.)   nitroGLYCERIN (NITROSTAT) 0.4 MG SL tablet Place 1 tablet (0.4 mg total) under the tongue every 5 (five) minutes as needed for chest pain.   Omega-3 Fatty Acids (FISH OIL) 1200 MG CAPS Take 1,200 mg by mouth daily.   oxyCODONE (ROXICODONE) 5 MG immediate release tablet Take 1 tablet (5 mg total) by mouth every 6 (six) hours as needed.   Oxymetazoline HCl (MUCINEX NASAL SPRAY FULL FORCE NA) Place 1 spray into the nose daily as needed (congestion).   pantoprazole (PROTONIX) 40 MG tablet Take 1 tablet (40 mg total) by mouth daily. Take 30-60 min before first meal of the day   PARoxetine (PAXIL) 20 MG tablet Take 10 mg by mouth daily.    Phenylephrine-Acetaminophen (TYLENOL SINUS+HEADACHE) 5-325 MG TABS Take 2 tablets by mouth daily as needed (sinuses / headaches).   Phenylephrine-DM-GG-APAP 5-10-200-325 MG/10ML LIQD Take 10 mLs by mouth daily as needed (congestion).   potassium chloride (KLOR-CON M) 10 MEQ tablet TAKE 1 TABLET BY MOUTH EVERY DAY   Red Yeast Rice 600 MG CAPS Take 600 mg by mouth daily.   silodosin (RAPAFLO) 8 MG CAPS capsule Take 1 capsule (8 mg total) by mouth at  bedtime.   sodium chloride (OCEAN) 0.65 % SOLN nasal spray Place 1 spray into both nostrils as needed for congestion.   vitamin E 180 MG (400 UNITS) capsule Take 400 Units by mouth daily.            Past Medical History:  Diagnosis Date   AF (atrial fibrillation) (HCC)    Anxiety and depression    Hypertension    Hypothyroidism    Obesity    Palpitations    SVT (supraventricular tachycardia) (HCC)       Objective:     Wt Readings from Last 3 Encounters:  05/10/23 250 lb (113.4 kg)  03/14/23 252 lb (  114.3 kg)  02/19/23 244 lb (110.7 kg)      Vital signs reviewed  05/10/2023  - Note at rest 02 sats  93% on RA   General appearance:    mod obese (by BMI) pleasant amb wm nad   HEENT : Oropharynx  clear         NECK :  without  apparent JVD/ palpable Nodes/TM    LUNGS: no acc muscle use,  Nl contour chest which is clear to A and P bilaterally without cough on insp or exp maneuvers   CV:  RRR  no s3 or murmur or increase in P2, and no edema   ABD:  obese soft and nontender   MS:  Gait nl   ext warm without deformities Or obvious joint restrictions  calf tenderness, cyanosis or clubbing    SKIN: warm and dry without lesions    NEURO:  alert, approp, nl sensorium with  no motor or cerebellar deficits apparent.           Assessment

## 2023-05-10 ENCOUNTER — Ambulatory Visit (HOSPITAL_COMMUNITY)
Admission: RE | Admit: 2023-05-10 | Discharge: 2023-05-10 | Disposition: A | Discharge status: Home / Self Care | Source: Ambulatory Visit | Attending: Internal Medicine | Admitting: Internal Medicine

## 2023-05-10 ENCOUNTER — Ambulatory Visit: Payer: 59 | Admitting: Internal Medicine

## 2023-05-10 ENCOUNTER — Encounter: Payer: Self-pay | Admitting: Internal Medicine

## 2023-05-10 ENCOUNTER — Ambulatory Visit (HOSPITAL_COMMUNITY): Admission: RE | Admit: 2023-05-10 | Payer: 59 | Source: Ambulatory Visit

## 2023-05-10 VITALS — BP 115/63 | HR 62 | Ht 69.5 in | Wt 250.0 lb

## 2023-05-10 DIAGNOSIS — J45991 Cough variant asthma: Secondary | ICD-10-CM | POA: Diagnosis not present

## 2023-05-10 DIAGNOSIS — E079 Disorder of thyroid, unspecified: Secondary | ICD-10-CM | POA: Insufficient documentation

## 2023-05-10 LAB — PULMONARY FUNCTION TEST
DL/VA % pred: 107 %
DL/VA: 4.26 ml/min/mmHg/L
DLCO unc % pred: 54 %
DLCO unc: 13.39 ml/min/mmHg
FEF 25-75 Post: 2.18 L/s
FEF 25-75 Pre: 1.45 L/s
FEF2575-%Change-Post: 50 %
FEF2575-%Pred-Post: 97 %
FEF2575-%Pred-Pre: 64 %
FEV1-%Change-Post: 10 %
FEV1-%Pred-Post: 71 %
FEV1-%Pred-Pre: 64 %
FEV1-Post: 2.16 L
FEV1-Pre: 1.95 L
FEV1FVC-%Change-Post: 7 %
FEV1FVC-%Pred-Pre: 100 %
FEV6-%Change-Post: 3 %
FEV6-%Pred-Post: 69 %
FEV6-%Pred-Pre: 67 %
FEV6-Post: 2.71 L
FEV6-Pre: 2.62 L
FEV6FVC-%Change-Post: 0 %
FEV6FVC-%Pred-Post: 104 %
FEV6FVC-%Pred-Pre: 105 %
FVC-%Change-Post: 3 %
FVC-%Pred-Post: 66 %
FVC-%Pred-Pre: 64 %
FVC-Post: 2.75 L
FVC-Pre: 2.66 L
Post FEV1/FVC ratio: 79 %
Post FEV6/FVC ratio: 99 %
Pre FEV1/FVC ratio: 73 %
Pre FEV6/FVC Ratio: 99 %
RV % pred: 48 %
RV: 1.19 L
TLC % pred: 61 %
TLC: 4.22 L

## 2023-05-10 MED ORDER — ALBUTEROL SULFATE (2.5 MG/3ML) 0.083% IN NEBU
2.5000 mg | INHALATION_SOLUTION | Freq: Once | RESPIRATORY_TRACT | Status: AC
  Administered 2023-05-10: 2.5 mg via RESPIRATORY_TRACT

## 2023-05-10 NOTE — Patient Instructions (Addendum)
 Plan A = Automatic = Always=    Symbicort 80 Take 2 puffs first thing in am and then another 2 puffs about 12 hours later.     Plan B = Backup (to supplement plan A, not to replace it) Only use your albuterol inhaler as a rescue medication to be used if you can't catch your breath by resting or doing a relaxed purse lip breathing pattern.  - The less you use it, the better it will work when you need it. - Ok to use the inhaler up to 2 puffs  every 4 hours if you must but call for appointment if use goes up over your usual need - Don't leave home without it !!  (think of it like the spare tire for your car)   Also  Ok to try albuterol 15 min before an activity (on alternating days)  that you know would usually make you short of breath and see if it makes any difference and if makes none then don't take albuterol after activity unless you can't catch your breath as this means it's the resting that helps, not the albuterol.        Please schedule a follow up visit in 6 months but call sooner if needed

## 2023-05-10 NOTE — Assessment & Plan Note (Addendum)
 Onset 2024  assoc with rhinitis/ hoarsness  ? Gerd and worse on breztri than symb 160  - 03/14/2023  After extensive coaching inhaler device,  effectiveness =    80% from a baseline of 50% so try symb 80 2bid  - sinus CT 03/21/23 :  minimal thickening, with R septal deviation. Annalee Genta was f/u)  - max gerd rx  03/14/2023 >>>  improved 05/10/2023  - PFT's  05/10/2023   FEV1 2.6 (71 % ) ratio 0.79  p 10 % improvement from saba p symbicort 80 prior to study with DLCO  13.4  (54 %)   and FV curve nl exp, slt saw tooth insp pattern s plateau  He still has some daily cough p supper with urge to clear throat assoc with pnds for which prev under Dr Oletta Darter care and for which he feels he can control symptoms with otcs so no need for more aggressive rx at this point  In terms of asthma All goals of chronic asthma control met including optimal function and elimination of symptoms with minimal need for rescue therapy on symbicort 80 2bid vs prev needing breztri so will rec the lowest dose that works approach = symbicort 80  2 puffs bid  Contingencies discussed in full including contacting this office immediately if not controlling the symptoms using the rule of two's.   F/u 6 m sooner prn   Each maintenance medication was reviewed in detail including emphasizing most importantly the difference between maintenance and prns and under what circumstances the prns are to be triggered using an action plan format where appropriate.  Total time for H and P, chart review, counseling, reviewing hfa  device(s) and generating customized AVS unique to this office visit / same day charting = 31 min summary f/u of

## 2023-05-14 DIAGNOSIS — J069 Acute upper respiratory infection, unspecified: Secondary | ICD-10-CM | POA: Diagnosis not present

## 2023-05-22 ENCOUNTER — Telehealth: Payer: Self-pay

## 2023-05-22 NOTE — Telephone Encounter (Signed)
 Copied from CRM [id number]. Topic: General - Call Back - No Documentation >> Apr 26, 2023 10:40 AM Crist Dominion wrote: Reason for CRM: Patient states he missed a call from the pulmonary clinic, advised patient there was no note left in his chart but reminded him of his appointment with Dr. Waymond Hailey on 4/10

## 2023-05-24 ENCOUNTER — Other Ambulatory Visit: Payer: Self-pay | Admitting: Internal Medicine

## 2023-05-24 DIAGNOSIS — J019 Acute sinusitis, unspecified: Secondary | ICD-10-CM | POA: Diagnosis not present

## 2023-05-24 DIAGNOSIS — R0981 Nasal congestion: Secondary | ICD-10-CM | POA: Diagnosis not present

## 2023-06-13 DIAGNOSIS — H905 Unspecified sensorineural hearing loss: Secondary | ICD-10-CM | POA: Diagnosis not present

## 2023-06-27 DIAGNOSIS — E1169 Type 2 diabetes mellitus with other specified complication: Secondary | ICD-10-CM | POA: Diagnosis not present

## 2023-06-27 DIAGNOSIS — Z299 Encounter for prophylactic measures, unspecified: Secondary | ICD-10-CM | POA: Diagnosis not present

## 2023-06-27 DIAGNOSIS — R52 Pain, unspecified: Secondary | ICD-10-CM | POA: Diagnosis not present

## 2023-06-27 DIAGNOSIS — I10 Essential (primary) hypertension: Secondary | ICD-10-CM | POA: Diagnosis not present

## 2023-07-13 DIAGNOSIS — J019 Acute sinusitis, unspecified: Secondary | ICD-10-CM | POA: Diagnosis not present

## 2023-09-07 DIAGNOSIS — R52 Pain, unspecified: Secondary | ICD-10-CM | POA: Diagnosis not present

## 2023-09-07 DIAGNOSIS — I480 Paroxysmal atrial fibrillation: Secondary | ICD-10-CM | POA: Diagnosis not present

## 2023-09-07 DIAGNOSIS — R634 Abnormal weight loss: Secondary | ICD-10-CM | POA: Diagnosis not present

## 2023-09-07 DIAGNOSIS — E119 Type 2 diabetes mellitus without complications: Secondary | ICD-10-CM | POA: Diagnosis not present

## 2023-09-07 DIAGNOSIS — Z299 Encounter for prophylactic measures, unspecified: Secondary | ICD-10-CM | POA: Diagnosis not present

## 2023-09-07 DIAGNOSIS — I10 Essential (primary) hypertension: Secondary | ICD-10-CM | POA: Diagnosis not present

## 2023-09-07 DIAGNOSIS — J449 Chronic obstructive pulmonary disease, unspecified: Secondary | ICD-10-CM | POA: Diagnosis not present

## 2023-10-16 DIAGNOSIS — I10 Essential (primary) hypertension: Secondary | ICD-10-CM | POA: Diagnosis not present

## 2023-10-16 DIAGNOSIS — I480 Paroxysmal atrial fibrillation: Secondary | ICD-10-CM | POA: Diagnosis not present

## 2023-10-16 DIAGNOSIS — Z299 Encounter for prophylactic measures, unspecified: Secondary | ICD-10-CM | POA: Diagnosis not present

## 2023-10-16 DIAGNOSIS — Z0000 Encounter for general adult medical examination without abnormal findings: Secondary | ICD-10-CM | POA: Diagnosis not present

## 2023-10-16 DIAGNOSIS — G252 Other specified forms of tremor: Secondary | ICD-10-CM | POA: Diagnosis not present

## 2023-10-16 DIAGNOSIS — E1169 Type 2 diabetes mellitus with other specified complication: Secondary | ICD-10-CM | POA: Diagnosis not present

## 2023-10-25 DIAGNOSIS — R1031 Right lower quadrant pain: Secondary | ICD-10-CM | POA: Diagnosis not present

## 2023-10-29 DIAGNOSIS — J069 Acute upper respiratory infection, unspecified: Secondary | ICD-10-CM | POA: Diagnosis not present

## 2023-11-06 ENCOUNTER — Telehealth: Payer: Self-pay | Admitting: Urology

## 2023-11-06 NOTE — Telephone Encounter (Signed)
 Return called to pt. Pt aware a message will send Dr. Sherrilee to review [facility] health US  scrotum and how to proceed with scheduling pt. Verbalized understanding

## 2023-11-06 NOTE — Telephone Encounter (Signed)
 Patient called into the office today with general questions/concerns regarding crystal in sperm tube. Dr Maree ordered an ultrasound and said he needs to see McKenzie asap Patient may be reached at [phone] to discuss questions.

## 2023-11-09 NOTE — Telephone Encounter (Signed)
 Patient called again about US  results he wants a call today

## 2023-11-09 NOTE — Telephone Encounter (Signed)
 Called pt to make he aware US  was routed to MD to review and someone will called him back with results. Voiced understanding.

## 2023-11-20 NOTE — Telephone Encounter (Signed)
 Pt made aware and voiced understanding Its a normal scrotal US 

## 2024-01-17 ENCOUNTER — Telehealth: Payer: Self-pay

## 2024-01-17 NOTE — Telephone Encounter (Signed)
 Copied from CRM #[id number]. Topic: Appointments - Scheduling Inquiry for Clinic >> Jan 17, 2024  1:09 PM Leonette P wrote: Reason for CRM: Patient called trying to schedule an appt.  He said he was not aware that he had ever been discharged.  He said he was going to come to the office but decided to stay where he was.  He would like someone to call him back  [phone]

## 2024-01-18 NOTE — Telephone Encounter (Signed)
 Patient is calling back to report that he has located another provider. To cancel the above request

## 2024-02-14 ENCOUNTER — Ambulatory Visit: Admitting: Internal Medicine

## 2024-02-14 ENCOUNTER — Encounter: Payer: Self-pay | Admitting: Internal Medicine

## 2024-02-14 VITALS — BP 97/60 | HR 96 | Ht 69.5 in | Wt 251.0 lb

## 2024-02-14 DIAGNOSIS — J45991 Cough variant asthma: Secondary | ICD-10-CM | POA: Diagnosis not present

## 2024-02-14 DIAGNOSIS — Z87891 Personal history of nicotine dependence: Secondary | ICD-10-CM

## 2024-02-14 DIAGNOSIS — J310 Chronic rhinitis: Secondary | ICD-10-CM | POA: Diagnosis not present

## 2024-02-14 DIAGNOSIS — R0609 Other forms of dyspnea: Secondary | ICD-10-CM

## 2024-02-14 MED ORDER — ALBUTEROL SULFATE HFA 108 (90 BASE) MCG/ACT IN AERS: 2.0000 | INHALATION_SPRAY | RESPIRATORY_TRACT | 11 refills | Status: AC | PRN

## 2024-02-14 NOTE — Patient Instructions (Signed)
 [facility]  [phone] for nasal problems since [facility] surgery   Continue symbicort  80 Take 2 puffs first thing in am and then another 2 puffs about 12 hours later.     Albuterol  is as needed   Also  Ok to try albuterol  15 min before an activity (on alternating days)  that you know would usually make you short of breath and see if it makes any difference and if makes none then don't take albuterol  after activity unless you can't catch your breath as this means it's the resting that helps, not the albuterol .  Please schedule a follow up visit in 12months but call sooner if needed - Bring inhalers

## 2024-02-14 NOTE — Assessment & Plan Note (Addendum)
 Referred back to [facility] >>>  -s/p remote [facility] surgery

## 2024-02-14 NOTE — Assessment & Plan Note (Addendum)
 Onset 2024  assoc with rhinitis/ hoarsness  ? Gerd and worse on breztri than symb 160  - 03/14/2023  After extensive coaching inhaler device,  effectiveness =    80% from a baseline of 50% so try symb 80 2bid  - sinus CT 03/21/23 :  minimal thickening, with R septal deviation. Justin [name] was f/u)  - max gerd rx  03/14/2023 >>>  improved 05/10/2023  - PFT's  05/10/2023   FEV1 2.6 (71 % ) ratio 0.79  p 10 % improvement from saba p symbicort  80 prior to study with DLCO  13.4  (54 %)   and FV curve nl exp, slt saw tooth insp pattern s plateau  Asthma well controlled - his doe is not likely asthma related and needs to change the way he uses saba:  Re SABA :  I spent extra time with pt today reviewing appropriate use of albuterol  for prn use on exertion with the following points: 1) saba is for relief of sob that does not improve by walking a slower pace or resting but rather if the pt does not improve after trying this first. 2) If the pt is convinced, as many are, that saba helps recover from activity faster then it's easy to tell if this is the case by re-challenging : ie stop, take the inhaler, then p 5 minutes try the exact same activity (intensity of workload) that just caused the symptoms and see if they are substantially diminished or not after saba 3) if there is an activity that reproducibly causes the symptoms, try the saba 15 min before the activity on alternate days   If in fact the saba really does help, then fine to continue to use it prn but advised may need to look closer at the maintenance regimen (symbicort  80)being used to achieve better control of airways disease with exertion.

## 2024-02-14 NOTE — Progress Notes (Signed)
 "   Justin [name], male    DOB: [birth year]    MRN: [id number]   Brief patient profile:  74  yowm quit smoking around 1995 at wt 195  referred to pulmonary clinic in [facility]  03/14/2023 by EDP at [facility]  for doe x 2024     History of Present Illness  03/14/2023  Pulmonary/ 1st office eval/ [facility] / [facility] maint on symbicort   160 (breztri made his symptoms worse) at wt 252  Chief Complaint  Patient presents with   Consult   Shortness of Breath  Dyspnea:  suddenly noted  doe with exertion in August 2024 but also feels more sob talking assoc with hoarseness.  Cough: dry hack night > day assoc with nasal congestion > 10  years prior to OV   Sleep: flat bed one pillow  SABA use: takes it bid  02: none Rec Plan A = Automatic = Always=    Symbicort  80 Take 2 puffs first thing in am and then another 2 puffs about 12 hours later.  Work on inhaler technique: Plan B = Backup (to supplement plan A, not to replace it) Only use your albuterol  inhaler as a rescue medication  Pantoprazole  (protonix ) 40 mg   Take  30-60 min before first meal of the day and Pepcid  (famotidine )  20 mg after supper until return to office  GERD diet reviewed, bed blocks rec  My office will be contacting you by phone for referral for  PFTs > not done as of 05/10/2023  Please schedule a follow up office visit in 6 weeks  bring inhalers  Sinus CT 03/21/23 :  minimal thickening, with R septal deviation.   05/10/2023  f/u ov/[facility] office/Akhilesh Sassone re: doe /uacs  maint on symb 80    did not bring meds  Chief Complaint  Patient presents with   COPD   Dyspnea:  heavy exertion only like steps x sev flights  Cough: mostly after supper better with otc rx (doesn't recall name)  Sleeping: flat bed one pillow and cough settles down noct s   resp cc  SABA use: rarely if ever 02: none Rec Plan A = Automatic = Always=    Symbicort  80 Take 2 puffs first thing in am and then another 2 puffs about 12 hours later.   Plan B =  Backup (to supplement plan A, not to replace it) Only use your albuterol  inhaler as a rescue medication Also  Ok to try albuterol  15 min before an activity (on alternating days)  that you know would usually make you short of breath a   02/14/2024  f/u ov/[place] [facility]/Merla Sawka re: DOE/ uacs  maint on symbicort  80  Chief Complaint  Patient presents with   Asthma    F/u   Dyspnea:  improved some  Cough: gone  Sleeping: flat bed one pillow s noct    resp cc  SABA use: avg twice daily  02: none  Difficulty breathing thru nose ever since surgery      No obvious day to day or daytime variability or assoc excess/ purulent sputum or mucus plugs or hemoptysis or cp or chest tightness, subjective wheeze or overt sinus or hb symptoms.    Also denies any obvious fluctuation of symptoms with weather or environmental changes or other aggravating or alleviating factors except as outlined above   No unusual exposure hx or h/o childhood pna/ asthma or knowledge of premature birth.  Current Allergies, Complete Past Medical History, Past  Surgical History, Family History, and Social History were reviewed in [employer] record.  ROS  The following are not active complaints unless bolded Hoarseness, sore throat, dysphagia, dental problems, itching, sneezing,  nasal congestion or discharge of excess mucus or purulent secretions, ear ache,   fever, chills, sweats, unintended wt loss or wt gain, classically pleuritic or exertional cp,  orthopnea pnd or arm/hand swelling  or leg swelling, presyncope, palpitations, abdominal pain, anorexia, nausea, vomiting, diarrhea  or change in bowel habits or change in bladder habits, change in stools or change in urine, dysuria, hematuria,  rash, arthralgias, visual complaints, headache, numbness, weakness or ataxia or problems with walking or coordination,  change in mood or  memory.         Outpatient Medications Prior to Visit  Medication Sig  Dispense Refill   albuterol  (VENTOLIN  HFA) 108 (90 Base) MCG/ACT inhaler Inhale 2 puffs into the lungs 4 (four) times daily.     ALPRAZolam  (XANAX ) 1 MG tablet Take 1 mg by mouth 3 (three) times daily.      b complex vitamins capsule Take 1 capsule by mouth daily.     benzonatate  (TESSALON ) 100 MG capsule Take 1 capsule (100 mg total) by mouth every 8 (eight) hours. 21 capsule 0   Bioflavonoid Products (VITAMIN C) CHEW Chew 1 tablet by mouth daily.     budesonide -formoterol  (SYMBICORT ) 80-4.5 MCG/ACT inhaler Take 2 puffs first thing in am and then another 2 puffs about 12 hours later. 1 each 12   Calcium-Magnesium-Zinc (CAL-MAG-ZINC PO) Take 1 tablet by mouth daily.     Coenzyme Q10 (COQ-10) 200 MG CAPS Take 400 mg by mouth daily.     CRANBERRY PO Take 2 tablets by mouth daily.     famotidine  (PEPCID ) 20 MG tablet One after supper 30 tablet 11   furosemide  (LASIX ) 40 MG tablet Take 40 mg by mouth daily.     Garlic 1000 MG CAPS Take 1,000 mg by mouth daily.     levofloxacin (LEVAQUIN) 500 MG tablet Take 500 mg by mouth daily.     levothyroxine  (SYNTHROID ) 137 MCG tablet Take 137 mcg by mouth daily.     meloxicam (MOBIC) 15 MG tablet Take 15 mg by mouth daily as needed.     metFORMIN (GLUCOPHAGE) 500 MG tablet Take 500 mg by mouth daily as needed (high blood sugar).     metoprolol  succinate (TOPROL -XL) 25 MG 24 hr tablet Take 1 tablet (25 mg total) by mouth daily. (Patient taking differently: Take 12.5 mg by mouth daily.) 90 tablet 3   nitroGLYCERIN  (NITROSTAT ) 0.4 MG SL tablet Place 1 tablet (0.4 mg total) under the tongue every 5 (five) minutes as needed for chest pain. 25 tablet 3   Omega-3 Fatty Acids (FISH OIL) 1200 MG CAPS Take 1,200 mg by mouth daily.     Oxymetazoline HCl (MUCINEX NASAL SPRAY FULL FORCE NA) Place 1 spray into the nose daily as needed (congestion).     pantoprazole  (PROTONIX ) 40 MG tablet TAKE 1 TABLET BY MOUTH DAILY. TAKE 30-60 MINUTES BEFORE FIRST MEAL OF THE DAY. 30  tablet 2   PARoxetine  (PAXIL ) 20 MG tablet Take 10 mg by mouth daily.      Phenylephrine -Acetaminophen  (TYLENOL  SINUS+HEADACHE) 5-325 MG TABS Take 2 tablets by mouth daily as needed (sinuses / headaches).     Phenylephrine -DM-GG-APAP 5-10-200-325 MG/10ML LIQD Take 10 mLs by mouth daily as needed (congestion).     potassium chloride  (KLOR-CON  M) 10 MEQ tablet  TAKE 1 TABLET BY MOUTH EVERY DAY 15 tablet 0   Red Yeast Rice 600 MG CAPS Take 600 mg by mouth daily.     silodosin  (RAPAFLO ) 8 MG CAPS capsule Take 1 capsule (8 mg total) by mouth at bedtime. 90 capsule 3   sodium chloride  (OCEAN) 0.65 % SOLN nasal spray Place 1 spray into both nostrils as needed for congestion.     vitamin E 180 MG (400 UNITS) capsule Take 400 Units by mouth daily.     No facility-administered medications prior to visit.           Past Medical History:  Diagnosis Date   AF (atrial fibrillation) (HCC)    Anxiety and depression    Hypertension    Hypothyroidism    Obesity    Palpitations    SVT (supraventricular tachycardia) (HCC)       Objective:    Wts   02/14/2024       251   05/10/23 250 lb (113.4 kg)  03/14/23 252 lb (114.3 kg)  02/19/23 244 lb (110.7 kg)     Amb mod obese (by BMI) wm nad    HEENT : Oropharynx  clear      Nasal turbinates mod edema   NECK :  without  apparent JVD/ palpable Nodes/TM / mild pseudowheeze   LUNGS: no acc muscle use,  Nl contour chest which is clear to A and P bilaterally without cough on insp or exp maneuvers   CV:  RRR  no s3 or murmur or increase in P2, and no edema   ABD:  soft and nontender   MS:  Gait nl   ext warm without deformities Or obvious joint restrictions  calf tenderness, cyanosis or clubbing    SKIN: warm and dry without lesions    NEURO:  alert, approp, nl sensorium with  no motor or cerebellar deficits apparent.         Assessment   Assessment & Plan Cough variant asthma vs upper airway cough syndrome Onset 2024  assoc with  rhinitis/ hoarsness  ? Gerd and worse on breztri than symb 160  - 03/14/2023  After extensive coaching inhaler device,  effectiveness =    80% from a baseline of 50% so try symb 80 2bid  - sinus CT 03/21/23 :  minimal thickening, with R septal deviation. [place] was f/u)  - max gerd rx  03/14/2023 >>>  improved 05/10/2023  - PFT's  05/10/2023   FEV1 2.6 (71 % ) ratio 0.79  p 10 % improvement from saba p symbicort  80 prior to study with DLCO  13.4  (54 %)   and FV curve nl exp, slt saw tooth insp pattern s plateau  Asthma well controlled - his doe is not likely asthma related and needs to change the way he uses saba:  Re SABA :  I spent extra time with pt today reviewing appropriate use of albuterol  for prn use on exertion with the following points: 1) saba is for relief of sob that does not improve by walking a slower pace or resting but rather if the pt does not improve after trying this first. 2) If the pt is convinced, as many are, that saba helps recover from activity faster then it's easy to tell if this is the case by re-challenging : ie stop, take the inhaler, then p 5 minutes try the exact same activity (intensity of workload) that just caused the symptoms and see if they are substantially  diminished or not after saba 3) if there is an activity that reproducibly causes the symptoms, try the saba 15 min before the activity on alternate days   If in fact the saba really does help, then fine to continue to use it prn but advised may need to look closer at the maintenance regimen (symbicort  80)being used to achieve better control of airways disease with exertion.    Rhinitis, chronic Referred back to [facility] >>>  -s/p remote [facility] surgery           Each maintenance medication was reviewed in detail including emphasizing most importantly the difference between maintenance and prns and under what circumstances the prns are to be triggered using an action plan format where  appropriate.  Total time for H and P, chart review, counseling, reviewing hfa device(s) and generating customized AVS unique to this office visit / same day charting = 26 min      AVS  Patient Instructions  [facility]  [phone] for nasal problems since [facility] surgery   Continue symbicort  80 Take 2 puffs first thing in am and then another 2 puffs about 12 hours later.     Albuterol  is as needed   Also  Ok to try albuterol  15 min before an activity (on alternating days)  that you know would usually make you short of breath and see if it makes any difference and if makes none then don't take albuterol  after activity unless you can't catch your breath as this means it's the resting that helps, not the albuterol .  Please schedule a follow up visit in 12months but call sooner if needed - Bring inhalers        Ozell America, MD 02/14/2024         "

## 2024-02-21 ENCOUNTER — Other Ambulatory Visit: Payer: Self-pay | Admitting: Internal Medicine

## 2024-02-21 DIAGNOSIS — J45991 Cough variant asthma: Secondary | ICD-10-CM

## 2024-03-05 ENCOUNTER — Ambulatory Visit: Admitting: Urology

## 2024-03-05 VITALS — BP 110/61 | HR 74

## 2024-03-05 DIAGNOSIS — R3912 Poor urinary stream: Secondary | ICD-10-CM

## 2024-03-05 DIAGNOSIS — N401 Enlarged prostate with lower urinary tract symptoms: Secondary | ICD-10-CM

## 2024-03-05 MED ORDER — SILODOSIN 8 MG PO CAPS: 8.0000 mg | ORAL_CAPSULE | Freq: Every day | ORAL | 3 refills | Status: AC

## 2024-03-05 MED ORDER — ALFUZOSIN HCL ER 10 MG PO TB24: 10.0000 mg | ORAL_TABLET | Freq: Every day | ORAL | 11 refills | Status: AC

## 2024-03-05 NOTE — Progress Notes (Unsigned)
 "  03/05/2024 10:32 AM   Justin [name] September 29, 1950 [id number]  Referring provider: Maree Isles, MD [address] [place],  [place] [address]  No chief complaint on file.   HPI: Mr Justin [name] is a 74yo here for followup for BPH with weak stream. He is on rapaflo  8mg  daily.  IPSS 12 QOl 2. He wishes to stop the rapaflo  versus swithc to a different medication due to cost   PMH: Past Medical History:  Diagnosis Date   AF (atrial fibrillation) (HCC)    Anxiety and depression    Hypertension    Hypothyroidism    Obesity    Palpitations    SVT (supraventricular tachycardia)     Surgical History: Past Surgical History:  Procedure Laterality Date   CARDIOVASCULAR STRESS TEST  09/05/2007   CHOLECYSTECTOMY     CYSTOSCOPY WITH INSERTION OF UROLIFT N/A 08/13/2017   Procedure: CYSTOSCOPY WITH INSERTION OF UROLIFT;  Surgeon: Sherrilee Belvie CROME, MD;  Location: [facility];  Service: Urology;  Laterality: N/A;   PAROTIDECTOMY  08/07/2018   PAROTIDECTOMY Right 08/07/2018   Procedure: PAROTIDECTOMY;  Surgeon: Mable Lenis, MD;  Location: [facility] OR;  Service: ENT;  Laterality: Right;   TONSILLECTOMY     XI ROBOTIC ASSISTED INGUINAL HERNIA REPAIR WITH MESH Right 11/06/2022   Procedure: XI ROBOTIC ASSISTED INGUINAL HERNIA REPAIR WITH MESH;  Surgeon: Mavis Anes, MD;  Location: [facility];  Service: General;  Laterality: Right;    Home Medications:  Allergies as of 03/05/2024       Reactions   Breztri Aerosphere [budeson-glycopyrrol-formoterol ]    Gabapentin Nausea And Vomiting   Halcion [triazolam] Other (See Comments)   Broke the hospital bed, altered mental state   Lisinopril Nausea And Vomiting   Pravastatin    headaches   Yupelri [revefenacin]         Medication List        Accurate as of March 05, 2024 10:32 AM. If you have any questions, ask your nurse or doctor.          albuterol  108 (90 Base) MCG/ACT inhaler Commonly known as: VENTOLIN  HFA Inhale 2 puffs into the lungs every 4  (four) hours as needed for wheezing or shortness of breath.   ALPRAZolam  1 MG tablet Commonly known as: XANAX  Take 1 mg by mouth 3 (three) times daily.   b complex vitamins capsule Take 1 capsule by mouth daily.   benzonatate  100 MG capsule Commonly known as: TESSALON  Take 1 capsule (100 mg total) by mouth every 8 (eight) hours.   budesonide -formoterol  80-4.5 MCG/ACT inhaler Commonly known as: Symbicort  INHALE 2 PUFFS first thing IN THE MORNING AND THEN another 2 PUFFS about 12 hours LATER   CAL-MAG-ZINC PO Take 1 tablet by mouth daily.   CoQ-10 200 MG Caps Take 400 mg by mouth daily.   CRANBERRY PO Take 2 tablets by mouth daily.   famotidine  20 MG tablet Commonly known as: Pepcid  One after supper   Fish Oil 1200 MG Caps Take 1,200 mg by mouth daily.   furosemide  40 MG tablet Commonly known as: LASIX  Take 40 mg by mouth daily.   Garlic 1000 MG Caps Take 1,000 mg by mouth daily.   levofloxacin 500 MG tablet Commonly known as: LEVAQUIN Take 500 mg by mouth daily.   levothyroxine  137 MCG tablet Commonly known as: SYNTHROID  Take 137 mcg by mouth daily.   meloxicam 15 MG tablet Commonly known as: MOBIC Take 15 mg by mouth daily as needed.  metFORMIN 500 MG tablet Commonly known as: GLUCOPHAGE Take 500 mg by mouth daily as needed (high blood sugar).   metoprolol  succinate 25 MG 24 hr tablet Commonly known as: TOPROL -XL Take 1 tablet (25 mg total) by mouth daily. What changed: how much to take   MUCINEX NASAL SPRAY FULL FORCE NA Place 1 spray into the nose daily as needed (congestion).   nitroGLYCERIN  0.4 MG SL tablet Commonly known as: NITROSTAT  Place 1 tablet (0.4 mg total) under the tongue every 5 (five) minutes as needed for chest pain.   pantoprazole  40 MG tablet Commonly known as: PROTONIX  TAKE 1 TABLET BY MOUTH DAILY. TAKE 30-60 MINUTES BEFORE FIRST MEAL OF THE DAY.   PARoxetine  20 MG tablet Commonly known as: PAXIL  Take 10 mg by mouth  daily.   Phenylephrine -DM-GG-APAP 5-10-200-325 MG/10ML Liqd Take 10 mLs by mouth daily as needed (congestion).   potassium chloride  10 MEQ tablet Commonly known as: KLOR-CON  M TAKE 1 TABLET BY MOUTH EVERY DAY   Red Yeast Rice 600 MG Caps Take 600 mg by mouth daily.   silodosin  8 MG Caps capsule Commonly known as: RAPAFLO  Take 1 capsule (8 mg total) by mouth at bedtime.   sodium chloride  0.65 % Soln nasal spray Commonly known as: OCEAN Place 1 spray into both nostrils as needed for congestion.   Tylenol  Sinus+Headache 5-325 MG Tabs Generic drug: Phenylephrine -Acetaminophen  Take 2 tablets by mouth daily as needed (sinuses / headaches).   Vitamin C Chew Chew 1 tablet by mouth daily.   vitamin E 180 MG (400 UNITS) capsule Take 400 Units by mouth daily.        Allergies: Allergies[1]  Family History: Family History  Problem Relation Age of Onset   Heart disease Mother    Heart disease Father     Social History:  reports that he has quit smoking. He has quit using smokeless tobacco.  His smokeless tobacco use included snuff. He reports that he does not drink alcohol  and does not use drugs.  ROS: All other review of systems were reviewed and are negative except what is noted above in HPI  Physical Exam: BP 110/61   Pulse 74   Constitutional:  Alert and oriented, No acute distress. HEENT: [place] AT, moist mucus membranes.  Trachea midline, no masses. Cardiovascular: No clubbing, cyanosis, or edema. Respiratory: Normal respiratory effort, no increased work of breathing. GI: Abdomen is soft, nontender, nondistended, no abdominal masses GU: No CVA tenderness.  Lymph: No cervical or inguinal lymphadenopathy. Skin: No rashes, bruises or suspicious lesions. Neurologic: Grossly intact, no focal deficits, moving all 4 extremities. Psychiatric: Normal mood and affect.  Laboratory Data: Lab Results  Component Value Date   WBC 6.3 02/06/2023   HGB 14.7 02/06/2023   HCT  42.7 02/06/2023   MCV 98.6 02/06/2023   PLT 124 (L) 02/06/2023    Lab Results  Component Value Date   CREATININE 1.18 02/06/2023    No results found for: PSA  No results found for: TESTOSTERONE  No results found for: HGBA1C  Urinalysis    Component Value Date/Time   COLORURINE YELLOW 11/20/2016 2039   APPEARANCEUR Clear 03/26/2023 0933   LABSPEC 1.015 11/20/2016 2039   PHURINE 5.0 11/20/2016 2039   GLUCOSEU Negative 03/26/2023 0933   HGBUR NEGATIVE 11/20/2016 2039   BILIRUBINUR Negative 03/26/2023 0933   KETONESUR NEGATIVE 11/20/2016 2039   PROTEINUR Negative 03/26/2023 0933   PROTEINUR NEGATIVE 11/20/2016 2039   NITRITE Negative 03/26/2023 0933   NITRITE NEGATIVE 11/20/2016  2039   LEUKOCYTESUR Negative 03/26/2023 0933    Lab Results  Component Value Date   LABMICR Comment 03/26/2023   WBCUA None seen 09/27/2021   LABEPIT None seen 09/27/2021   MUCUS Present 11/10/2020   BACTERIA None seen 09/27/2021    Pertinent Imaging: *** No results found for this or any previous visit.  No results found for this or any previous visit.  No results found for this or any previous visit.  No results found for this or any previous visit.  No results found for this or any previous visit.  No results found for this or any previous visit.  No results found for this or any previous visit.  No results found for this or any previous visit.   Assessment & Plan:    1. Benign prostatic hyperplasia with urinary obstruction (Primary) ***  2. Weak urinary stream ***   No follow-ups on file.  Belvie Clara, MD  [facility] [place]      [1]  Allergies Allergen Reactions   Lorraine Side [Budeson-Glycopyrrol-Formoterol ]    Gabapentin Nausea And Vomiting   Halcion [Triazolam] Other (See Comments)    Broke the hospital bed, altered mental state   Lisinopril Nausea And Vomiting   Pravastatin     headaches   Yupelri [Revefenacin]    "
